# Patient Record
Sex: Male | Born: 1955
Health system: Southern US, Community
[De-identification: ages and names within clinical notes are randomized; demographics above are authoritative.]

## PROBLEM LIST (undated history)

## (undated) DIAGNOSIS — F1021 Alcohol dependence, in remission: Secondary | ICD-10-CM

## (undated) DIAGNOSIS — F32A Depression, unspecified: Secondary | ICD-10-CM

## (undated) DIAGNOSIS — I10 Essential (primary) hypertension: Secondary | ICD-10-CM

## (undated) DIAGNOSIS — I639 Cerebral infarction, unspecified: Secondary | ICD-10-CM

## (undated) DIAGNOSIS — T7840XA Allergy, unspecified, initial encounter: Secondary | ICD-10-CM

## (undated) DIAGNOSIS — I4891 Unspecified atrial fibrillation: Secondary | ICD-10-CM

## (undated) DIAGNOSIS — F329 Major depressive disorder, single episode, unspecified: Secondary | ICD-10-CM

## (undated) DIAGNOSIS — G4733 Obstructive sleep apnea (adult) (pediatric): Secondary | ICD-10-CM

## (undated) DIAGNOSIS — E785 Hyperlipidemia, unspecified: Secondary | ICD-10-CM

## (undated) DIAGNOSIS — M109 Gout, unspecified: Secondary | ICD-10-CM

## (undated) DIAGNOSIS — G4730 Sleep apnea, unspecified: Secondary | ICD-10-CM

## (undated) DIAGNOSIS — Z9989 Dependence on other enabling machines and devices: Principal | ICD-10-CM

## (undated) DIAGNOSIS — K635 Polyp of colon: Secondary | ICD-10-CM

## (undated) DIAGNOSIS — I2510 Atherosclerotic heart disease of native coronary artery without angina pectoris: Secondary | ICD-10-CM

## (undated) HISTORY — DX: Cerebral infarction, unspecified: I63.9

## (undated) HISTORY — DX: Gout, unspecified: M10.9

## (undated) HISTORY — DX: Essential (primary) hypertension: I10

## (undated) HISTORY — PX: FINGER NEUROPLASTY: SHX1639

## (undated) HISTORY — DX: Depression, unspecified: F32.A

## (undated) HISTORY — DX: Allergy, unspecified, initial encounter: T78.40XA

## (undated) HISTORY — DX: Alcohol dependence, in remission: F10.21

## (undated) HISTORY — DX: Unspecified atrial fibrillation: I48.91

## (undated) HISTORY — PX: POLYPECTOMY: SHX149

## (undated) HISTORY — DX: Major depressive disorder, single episode, unspecified: F32.9

## (undated) HISTORY — DX: Polyp of colon: K63.5

## (undated) HISTORY — DX: Sleep apnea, unspecified: G47.30

## (undated) HISTORY — DX: Hyperlipidemia, unspecified: E78.5

## (undated) HISTORY — DX: Dependence on other enabling machines and devices: Z99.89

## (undated) HISTORY — DX: Obstructive sleep apnea (adult) (pediatric): G47.33

## (undated) HISTORY — DX: Atherosclerotic heart disease of native coronary artery without angina pectoris: I25.10

---

## 2001-06-18 HISTORY — PX: OTHER SURGICAL HISTORY: SHX169

## 2010-08-18 ENCOUNTER — Encounter: Payer: Self-pay | Admitting: Cardiology

## 2010-08-18 ENCOUNTER — Institutional Professional Consult (permissible substitution) (INDEPENDENT_AMBULATORY_CARE_PROVIDER_SITE_OTHER): Payer: No Typology Code available for payment source | Admitting: Cardiology

## 2010-08-18 DIAGNOSIS — R0789 Other chest pain: Secondary | ICD-10-CM

## 2010-08-18 DIAGNOSIS — I2510 Atherosclerotic heart disease of native coronary artery without angina pectoris: Secondary | ICD-10-CM

## 2010-08-18 DIAGNOSIS — I10 Essential (primary) hypertension: Secondary | ICD-10-CM

## 2010-08-18 DIAGNOSIS — E7800 Pure hypercholesterolemia, unspecified: Secondary | ICD-10-CM

## 2010-08-28 ENCOUNTER — Other Ambulatory Visit: Payer: Self-pay | Admitting: Cardiology

## 2010-08-28 ENCOUNTER — Other Ambulatory Visit: Payer: Self-pay | Admitting: Family Medicine

## 2010-08-28 DIAGNOSIS — R109 Unspecified abdominal pain: Secondary | ICD-10-CM

## 2010-08-30 ENCOUNTER — Encounter (INDEPENDENT_AMBULATORY_CARE_PROVIDER_SITE_OTHER): Payer: Self-pay | Admitting: *Deleted

## 2010-08-30 DIAGNOSIS — R079 Chest pain, unspecified: Secondary | ICD-10-CM | POA: Insufficient documentation

## 2010-09-05 NOTE — Progress Notes (Signed)
Summary: [facility]   Imported By: Lenard Forth 08/29/2010 12:55:10  _____________________________________________________________________  External Attachment:    Type:   Image     Comment:   External Document

## 2010-09-05 NOTE — Medication Information (Signed)
Summary: CTA-Promise study   CTA-Promise study   Imported By: Lenard Forth 08/29/2010 12:53:47  _____________________________________________________________________  External Attachment:    Type:   Image     Comment:   External Document

## 2010-09-05 NOTE — Letter (Signed)
Summary: Generic Letter  Architectural technologist, Main Office  [address]   [place], [place] [address]   Phone: [phone]  Fax: [phone]        August 30, 2010 MRN: [id number]    Richard [name] [address] [place], [place]  [address]    Dear Mr. Rasool,   Dear Mr. Gnau,   Dr. Swaziland  has scheduled you to have an CARDIAC CT 09/21/10 at 1pm.  DO NOT EAT OR DRINK ANYTHING AFTER 9AM, THE MORNING OF YOUR PROCEDURE.  PLEASE ARRIVE 1 HOUR PRIOR TO APPOINTMENT TIME,AT THE [place] OUT-PATIENT ADMISSION OFFICE LOCATED ON THE FIRST FLOOR NEAR THE GIFT SHOP.   Sincerely,  Richard [name]  This letter has been electronically signed by your physician.       Appended Document: Generic Letter    Clinical Lists Changes  Problems: Added new problem of CHEST PAIN (ICD-786.50) Orders: Added new Referral order of Cardiac CTA (Cardiac CTA) - Signed

## 2010-09-21 ENCOUNTER — Ambulatory Visit (HOSPITAL_COMMUNITY): Admission: RE | Admit: 2010-09-21 | Payer: No Typology Code available for payment source | Source: Ambulatory Visit

## 2010-09-21 ENCOUNTER — Ambulatory Visit: Payer: No Typology Code available for payment source | Admitting: Cardiology

## 2010-09-21 ENCOUNTER — Ambulatory Visit (HOSPITAL_COMMUNITY)
Admission: RE | Admit: 2010-09-21 | Discharge: 2010-09-21 | Disposition: A | Discharge status: Home / Self Care | Payer: No Typology Code available for payment source | Source: Ambulatory Visit | Attending: Cardiology | Admitting: Cardiology

## 2010-09-21 DIAGNOSIS — R9439 Abnormal result of other cardiovascular function study: Secondary | ICD-10-CM | POA: Insufficient documentation

## 2010-09-21 DIAGNOSIS — E785 Hyperlipidemia, unspecified: Secondary | ICD-10-CM | POA: Insufficient documentation

## 2010-09-21 DIAGNOSIS — I10 Essential (primary) hypertension: Secondary | ICD-10-CM | POA: Insufficient documentation

## 2010-09-21 DIAGNOSIS — I2510 Atherosclerotic heart disease of native coronary artery without angina pectoris: Secondary | ICD-10-CM | POA: Insufficient documentation

## 2010-09-21 MED ORDER — IOHEXOL 350 MG/ML SOLN
80.0000 mL | Freq: Once | INTRAVENOUS | Status: AC | PRN
  Administered 2010-09-21: 80 mL via INTRAVENOUS

## 2010-09-25 ENCOUNTER — Telehealth: Payer: Self-pay | Admitting: *Deleted

## 2010-09-25 NOTE — Telephone Encounter (Signed)
Message copied by Murrell Redden on Mon Sep 25, 2010  2:05 PM ------      Message from: Swaziland, PETER      Created: Thu Sep 21, 2010  5:08 PM       As noted before.Marland Kitchen

## 2010-09-25 NOTE — Telephone Encounter (Signed)
LM w/ results of CTA

## 2010-10-05 ENCOUNTER — Encounter: Payer: Self-pay | Admitting: Cardiology

## 2010-10-05 DIAGNOSIS — I10 Essential (primary) hypertension: Secondary | ICD-10-CM | POA: Insufficient documentation

## 2010-10-05 DIAGNOSIS — E785 Hyperlipidemia, unspecified: Secondary | ICD-10-CM | POA: Insufficient documentation

## 2010-10-09 ENCOUNTER — Encounter: Payer: Self-pay | Admitting: Cardiology

## 2010-10-09 ENCOUNTER — Ambulatory Visit (INDEPENDENT_AMBULATORY_CARE_PROVIDER_SITE_OTHER): Payer: No Typology Code available for payment source | Admitting: Cardiology

## 2010-10-09 DIAGNOSIS — R079 Chest pain, unspecified: Secondary | ICD-10-CM

## 2010-10-09 DIAGNOSIS — E785 Hyperlipidemia, unspecified: Secondary | ICD-10-CM

## 2010-10-09 DIAGNOSIS — I10 Essential (primary) hypertension: Secondary | ICD-10-CM

## 2010-10-09 NOTE — Assessment & Plan Note (Signed)
Recent lab work demonstrated significant cholesterol elevation with a total cholesterol 257 and an LDL of 192. Now on Lipitor 20 mg daily in addition to Trilipix. I recommend repeat blood work in 3 months. We will help Richard [name] get established with primary care and I will see him back as needed.

## 2010-10-09 NOTE — Assessment & Plan Note (Signed)
Good blood pressure control. We'll continue with his current medical therapy.

## 2010-10-09 NOTE — Assessment & Plan Note (Signed)
Nonobstructive coronary disease noted by cardiac CT angiogram. I recommended risk factor modification.

## 2010-10-09 NOTE — Patient Instructions (Signed)
Continue to take your medications.   You need to have your blood work rechecked in 3 months.  I will see you back as needed.   Stay on top of your blood pressure and cholesterol!  Get regular aerobic exercise.

## 2010-10-09 NOTE — Progress Notes (Signed)
   Richard [name] Date of Birth: 04-28-1956   History of Present Illness: Richard [name] is seen for followup of his cardiac CT angiogram. This demonstrated a less than 40% stenosis in the proximal LAD with a focal area of calcification. No obstructive disease was noted. He has had no further chest pain. He feels very well.  Current Outpatient Prescriptions on File Prior to Visit  Medication Sig Dispense Refill  . atenolol (TENORMIN) 25 MG tablet Take 25 mg by mouth 2 (two) times daily.        Marland Kitchen atorvastatin (LIPITOR) 20 MG tablet Take 20 mg by mouth daily.        . Choline Fenofibrate (TRILIPIX) 135 MG capsule Take 135 mg by mouth daily.        Marland Kitchen diltiazem (DILACOR XR) 240 MG 24 hr capsule Take 240 mg by mouth daily.          Allergies  Allergen Reactions  . Penicillins     Past Medical History  Diagnosis Date  . Hyperlipidemia   . Hypertension     Past Surgical History  Procedure Date  . Finger neuroplasty     left    History  Smoking status  . Never Smoker   Smokeless tobacco  . Not on file    History  Alcohol Use No    Family History  Problem Relation Age of Onset  . Hypertension Mother   . Diabetes Mother   . Heart attack Brother     Review of Systems:  All other systems were reviewed and are negative.  Physical Exam: BP 118/74  Pulse 70  Wt 216 lb (97.977 kg) He is well-developed white male in no acute distress. HEENT exam is unremarkable. Lungs are clear. Cardiac exam reveals a regular rate and rhythm without gallop, murmur, or click. He has no JVD or bruits. Abdomen is soft and nontender. He has no edema. Pedal pulses are good. LABORATORY DATA: Cardiac CT angiogram is as noted above.  Assessment / Plan:

## 2010-10-11 ENCOUNTER — Telehealth: Payer: Self-pay | Admitting: Cardiology

## 2010-10-11 NOTE — Telephone Encounter (Signed)
Called to let Richard [name]/Richard [name] know that his primary care provider is Dr. Arthur Holms at [facility].

## 2010-11-20 ENCOUNTER — Other Ambulatory Visit: Payer: Self-pay | Admitting: *Deleted

## 2010-11-20 MED ORDER — ATENOLOL 25 MG PO TABS: 25.0000 mg | ORAL_TABLET | Freq: Two times a day (BID) | ORAL | Status: DC

## 2010-11-20 MED ORDER — ATORVASTATIN CALCIUM 20 MG PO TABS: 20.0000 mg | ORAL_TABLET | Freq: Every day | ORAL | Status: DC

## 2010-11-20 MED ORDER — DILTIAZEM HCL ER 240 MG PO CP24: 240.0000 mg | ORAL_CAPSULE | Freq: Every day | ORAL | Status: DC

## 2010-11-20 MED ORDER — CHOLINE FENOFIBRATE 135 MG PO CPDR: 135.0000 mg | DELAYED_RELEASE_CAPSULE | Freq: Every day | ORAL | Status: DC

## 2010-11-20 NOTE — Telephone Encounter (Signed)
escribe medication per fax request  

## 2010-12-05 ENCOUNTER — Other Ambulatory Visit: Payer: Self-pay | Admitting: Cardiology

## 2010-12-15 ENCOUNTER — Other Ambulatory Visit: Payer: Self-pay | Admitting: *Deleted

## 2010-12-15 MED ORDER — DILTIAZEM HCL ER 240 MG PO CP24: 240.0000 mg | ORAL_CAPSULE | Freq: Every day | ORAL | Status: DC

## 2010-12-15 NOTE — Telephone Encounter (Signed)
escribe medication per fax request  

## 2011-08-25 ENCOUNTER — Encounter: Payer: Self-pay | Admitting: Internal Medicine

## 2011-08-25 DIAGNOSIS — F1021 Alcohol dependence, in remission: Secondary | ICD-10-CM

## 2011-08-25 DIAGNOSIS — I2510 Atherosclerotic heart disease of native coronary artery without angina pectoris: Secondary | ICD-10-CM

## 2011-08-25 HISTORY — DX: Alcohol dependence, in remission: F10.21

## 2011-08-25 HISTORY — DX: Atherosclerotic heart disease of native coronary artery without angina pectoris: I25.10

## 2011-08-30 ENCOUNTER — Ambulatory Visit (INDEPENDENT_AMBULATORY_CARE_PROVIDER_SITE_OTHER): Payer: No Typology Code available for payment source | Admitting: Internal Medicine

## 2011-08-30 ENCOUNTER — Other Ambulatory Visit (INDEPENDENT_AMBULATORY_CARE_PROVIDER_SITE_OTHER): Payer: No Typology Code available for payment source

## 2011-08-30 ENCOUNTER — Encounter: Payer: Self-pay | Admitting: Internal Medicine

## 2011-08-30 VITALS — BP 108/60 | HR 52 | Temp 98.4°F | Ht 72.0 in | Wt 220.4 lb

## 2011-08-30 DIAGNOSIS — Z0000 Encounter for general adult medical examination without abnormal findings: Secondary | ICD-10-CM

## 2011-08-30 DIAGNOSIS — Z0001 Encounter for general adult medical examination with abnormal findings: Secondary | ICD-10-CM | POA: Insufficient documentation

## 2011-08-30 DIAGNOSIS — F32A Depression, unspecified: Secondary | ICD-10-CM | POA: Insufficient documentation

## 2011-08-30 DIAGNOSIS — F329 Major depressive disorder, single episode, unspecified: Secondary | ICD-10-CM | POA: Insufficient documentation

## 2011-08-30 LAB — CBC WITH DIFFERENTIAL/PLATELET
Basophils Relative: 0.6 % (ref 0.0–3.0)
Eosinophils Relative: 1.2 % (ref 0.0–5.0)
HCT: 46.9 % (ref 39.0–52.0)
Hemoglobin: 15.7 g/dL (ref 13.0–17.0)
Lymphs Abs: 2.2 10*3/uL (ref 0.7–4.0)
MCV: 91.3 fl (ref 78.0–100.0)
Monocytes Absolute: 0.6 10*3/uL (ref 0.1–1.0)
Monocytes Relative: 9.4 % (ref 3.0–12.0)
Neutro Abs: 3.8 10*3/uL (ref 1.4–7.7)
Platelets: 179 10*3/uL (ref 150.0–400.0)
RBC: 5.14 Mil/uL (ref 4.22–5.81)
WBC: 6.8 10*3/uL (ref 4.5–10.5)

## 2011-08-30 LAB — LIPID PANEL
HDL: 51.8 mg/dL (ref >39.00)
Total CHOL/HDL Ratio: 4
VLDL: 11.2 mg/dL (ref 0.0–40.0)

## 2011-08-30 LAB — URINALYSIS, ROUTINE W REFLEX MICROSCOPIC
Bilirubin Urine: NEGATIVE
Ketones, ur: NEGATIVE
Leukocytes, UA: NEGATIVE
Nitrite: NEGATIVE
Specific Gravity, Urine: <=1.005 (ref 1.000–1.030)
Total Protein, Urine: NEGATIVE
pH: 5.5 (ref 5.0–8.0)

## 2011-08-30 LAB — HEPATIC FUNCTION PANEL
ALT: 22 U/L (ref 0–53)
AST: 24 U/L (ref 0–37)
Albumin: 4.7 g/dL (ref 3.5–5.2)
Alkaline Phosphatase: 68 U/L (ref 39–117)
Bilirubin, Direct: 0.1 mg/dL (ref 0.0–0.3)

## 2011-08-30 LAB — BASIC METABOLIC PANEL
BUN: 17 mg/dL (ref 6–23)
Creatinine, Ser: 1.2 mg/dL (ref 0.4–1.5)
GFR: 63.83 mL/min (ref >60.00)
Potassium: 4 mEq/L (ref 3.5–5.1)

## 2011-08-30 LAB — TSH: TSH: 1.07 u[IU]/mL (ref 0.35–5.50)

## 2011-08-30 LAB — PSA: PSA: 2.22 ng/mL (ref 0.10–4.00)

## 2011-08-30 LAB — LDL CHOLESTEROL, DIRECT: Direct LDL: 146.2 mg/dL

## 2011-08-30 MED ORDER — ASPIRIN 81 MG PO TBEC: 81.0000 mg | DELAYED_RELEASE_TABLET | Freq: Every day | ORAL | Status: AC

## 2011-08-30 NOTE — Patient Instructions (Signed)
Please start Aspirin 81 mg per day - COATED only Continue all other medications as before Please have the pharmacy call with any refills you may need. Please go to LAB in the Basement for the blood and/or urine tests to be done today You will be contacted by phone if any changes need to be made immediately.  Otherwise, you will receive a letter about your results with an explanation. You will be contacted regarding the referral for: colonoscopy Please return in 1 year for your yearly visit, or sooner if needed, with Lab testing done 3-5 days before

## 2011-08-31 ENCOUNTER — Encounter: Payer: Self-pay | Admitting: Gastroenterology

## 2011-08-31 ENCOUNTER — Telehealth: Payer: Self-pay | Admitting: Internal Medicine

## 2011-08-31 MED ORDER — ATORVASTATIN CALCIUM 40 MG PO TABS: 40.0000 mg | ORAL_TABLET | Freq: Every day | ORAL | Status: DC

## 2011-08-31 NOTE — Telephone Encounter (Signed)
Ok to incr the lipitor to 40 mg  - done per [facility] re-check with next visit  To follow lower chol diet as well  Letter sent as well

## 2011-08-31 NOTE — Telephone Encounter (Signed)
Patient informed. 

## 2011-08-31 NOTE — Telephone Encounter (Signed)
Message copied by Corwin Levins on Fri Aug 31, 2011  1:19 PM ------      Message from: Scharlene Gloss B      Created: Fri Aug 31, 2011 10:02 AM       Called the patient and he had not been taking Lipitor everyday up until 1 week ago.

## 2011-09-02 ENCOUNTER — Encounter: Payer: Self-pay | Admitting: Internal Medicine

## 2011-09-02 NOTE — Assessment & Plan Note (Addendum)
Overall doing well, age appropriate education and counseling updated, referrals for preventative services and immunizations addressed, dietary and smoking counseling addressed, most recent labs and ECG reviewed.  I have personally reviewed and have noted: 1) the patient's medical and social history 2) The pt's use of alcohol, tobacco, and illicit drugs 3) The patient's current medications and supplements 4) Functional ability including ADL's, fall risk, home safety risk, hearing and visual impairment 5) Diet and physical activities 6) Evidence for depression or mood disorder 7) The patient's height, weight, and BMI have been recorded in the chart I have made referrals, and provided counseling and education based on review of the above For labs today, also for colonoscpy

## 2011-09-02 NOTE — Progress Notes (Signed)
Subjective:    Patient ID: Richard [name], male    DOB: 03-17-1956, 56 y.o.   MRN: [id number]  HPI  Here for wellness and f/u;  Overall doing ok;  Pt denies CP, worsening SOB, DOE, wheezing, orthopnea, PND, worsening LE edema, palpitations, dizziness or syncope.  Pt denies neurological change such as new Headache, facial or extremity weakness.  Pt denies polydipsia, polyuria, or low sugar symptoms. Pt states overall good compliance with treatment and medications, good tolerability, and trying to follow lower cholesterol diet.  Pt denies worsening depressive symptoms, suicidal ideation or panic. No fever, wt loss, night sweats, loss of appetite, or other constitutional symptoms.  Pt states good ability with ADL's, low fall risk, home safety reviewed and adequate, no significant changes in hearing or vision, and occasionally active with exercise.  No new complaints Past Medical History  Diagnosis Date  . Hyperlipidemia   . Hypertension   . CAD (coronary artery disease) 08/25/2011  . Alcoholism in remission 08/25/2011  . Depression    Past Surgical History  Procedure Date  . Finger neuroplasty     left  . Left achillies 2003    reports that he has never smoked. He does not have any smokeless tobacco history on file. He reports that he does not drink alcohol or use illicit drugs. family history includes Diabetes in his mother; Heart attack in his brother; and Hypertension in his mother. Allergies  Allergen Reactions  . Penicillins    Current Outpatient Prescriptions on File Prior to Visit  Medication Sig Dispense Refill  . atenolol (TENORMIN) 25 MG tablet Take 1 tablet (25 mg total) by mouth 2 (two) times daily.  180 tablet  3  . Choline Fenofibrate (TRILIPIX) 135 MG capsule Take 1 capsule (135 mg total) by mouth daily.  90 capsule  3  . diltiazem (DILACOR XR) 240 MG 24 hr capsule Take 1 capsule (240 mg total) by mouth daily.  90 capsule  3   Review of Systems Review of Systems    Constitutional: Negative for diaphoresis, activity change, appetite change and unexpected weight change.  HENT: Negative for hearing loss, ear pain, facial swelling, mouth sores and neck stiffness.   Eyes: Negative for pain, redness and visual disturbance.  Respiratory: Negative for shortness of breath and wheezing.   Cardiovascular: Negative for chest pain and palpitations.  Gastrointestinal: Negative for diarrhea, blood in stool, abdominal distention and rectal pain.  Genitourinary: Negative for hematuria, flank pain and decreased urine volume.  Musculoskeletal: Negative for myalgias and joint swelling.  Skin: Negative for color change and wound.  Neurological: Negative for syncope and numbness.  Hematological: Negative for adenopathy.  Psychiatric/Behavioral: Negative for hallucinations, self-injury, decreased concentration and agitation.      Objective:   Physical Exam BP 108/60  Pulse 52  Temp(Src) 98.4 F (36.9 C) (Oral)  Ht 6' (1.829 m)  Wt 220 lb 6 oz (99.961 kg)  BMI 29.89 kg/m2  SpO2 98% Physical Exam  VS noted Constitutional: Pt is oriented to person, place, and time. Appears well-developed and well-nourished.  HENT:  Head: Normocephalic and atraumatic.  Right Ear: External ear normal.  Left Ear: External ear normal.  Nose: Nose normal.  Mouth/Throat: Oropharynx is clear and moist.  Eyes: Conjunctivae and EOM are normal. Pupils are equal, round, and reactive to light.  Neck: Normal range of motion. Neck supple. No JVD present. No tracheal deviation present.  Cardiovascular: Normal rate, regular rhythm, normal heart sounds and intact distal pulses.  Pulmonary/Chest: Effort normal and breath sounds normal.  Abdominal: Soft. Bowel sounds are normal. There is no tenderness.  Musculoskeletal: Normal range of motion. Exhibits no edema.  Lymphadenopathy:  Has no cervical adenopathy.  Neurological: Pt is alert and oriented to person, place, and time. Pt has normal  reflexes. No cranial nerve deficit.  Skin: Skin is warm and dry. No rash noted.  Psychiatric:  Has  normal mood and affect. Behavior is normal.     Assessment & Plan:

## 2011-10-08 ENCOUNTER — Ambulatory Visit (AMBULATORY_SURGERY_CENTER): Payer: No Typology Code available for payment source | Admitting: *Deleted

## 2011-10-08 VITALS — Ht 72.0 in | Wt 218.2 lb

## 2011-10-08 DIAGNOSIS — Z1211 Encounter for screening for malignant neoplasm of colon: Secondary | ICD-10-CM

## 2011-10-08 MED ORDER — PEG-KCL-NACL-NASULF-NA ASC-C 100 G PO SOLR: ORAL | Status: DC

## 2011-10-09 ENCOUNTER — Encounter: Payer: Self-pay | Admitting: Gastroenterology

## 2011-10-12 NOTE — Progress Notes (Signed)
Addended by: Maple Hudson on: 10/12/2011 12:19 PM   Modules accepted: Level of Service

## 2011-10-22 ENCOUNTER — Ambulatory Visit (AMBULATORY_SURGERY_CENTER): Payer: No Typology Code available for payment source | Admitting: Gastroenterology

## 2011-10-22 ENCOUNTER — Encounter: Payer: Self-pay | Admitting: Gastroenterology

## 2011-10-22 VITALS — BP 140/87 | HR 66 | Temp 98.1°F | Resp 18 | Ht 72.0 in | Wt 218.0 lb

## 2011-10-22 DIAGNOSIS — D126 Benign neoplasm of colon, unspecified: Secondary | ICD-10-CM

## 2011-10-22 DIAGNOSIS — Z1211 Encounter for screening for malignant neoplasm of colon: Secondary | ICD-10-CM

## 2011-10-22 HISTORY — PX: COLONOSCOPY: SHX174

## 2011-10-22 MED ORDER — SODIUM CHLORIDE 0.9 % IV SOLN: 500.0000 mL | INTRAVENOUS | Status: DC

## 2011-10-22 NOTE — Progress Notes (Signed)
Patient did not have preoperative order for IV antibiotic SSI prophylaxis. (G8918)  Patient did not experience any of the following events: a burn prior to discharge; a fall within the facility; wrong site/side/patient/procedure/implant event; or a hospital transfer or hospital admission upon discharge from the facility. (G8907)  

## 2011-10-22 NOTE — Patient Instructions (Signed)

## 2011-10-22 NOTE — Op Note (Signed)
[facility] [address] [place], [place]  [address]  COLONOSCOPY PROCEDURE REPORT  PATIENT:  Richard, [name]  MR#:  [id number] BIRTHDATE:  [birth year], 56 yrs. old  GENDER:  male ENDOSCOPIST:  Rachael Fee, MD REF. BY:  Oliver Barre, M.D. PROCEDURE DATE:  10/22/2011 PROCEDURE:  Colonoscopy with snare polypectomy ASA CLASS:  Class II INDICATIONS:  Routine Risk Screening MEDICATIONS:   Fentanyl 75 mcg IV, These medications were titrated to patient response per physician's verbal order, Versed 7 mg IV  DESCRIPTION OF PROCEDURE:   After the risks benefits and alternatives of the procedure were thoroughly explained, informed consent was obtained.  Digital rectal exam was performed and revealed no rectal masses.   The LB [id number] endoscope was introduced through the anus and advanced to the cecum, which was identified by both the appendix and ileocecal valve, without limitations.  The quality of the prep was good..  The instrument was then slowly withdrawn as the colon was fully examined. <<PROCEDUREIMAGES>> FINDINGS:  A 4mm, sessile polyp was removed from descending colon with cold snare and sent to pathology (jar 1). Two pedunculated polyps, 8-58mm across, were removed from sigmoid colon with snare/cautery and sent to pathology (jar 2) (see image3, image5, and image6).  This was otherwise a normal examination of the colon (see image8, image2, and image1).   Retroflexed views in the rectum revealed no abnormalities. COMPLICATIONS:  None  ENDOSCOPIC IMPRESSION: 1) Three polyps were removed, all were sent to pathology 2) Otherwise normal examination  RECOMMENDATIONS: 1) If the polyps removed today are proven to be adenomatous (pre-cancerous) polyps, you will need a colonoscopy in 3 years. Otherwise you should continue to follow colorectal cancer screening guidelines for "routine risk" patients with a colonoscopy in 10 years. You will receive a letter within  1-2 weeks with the results of your biopsy as well as final recommendations. Please call my office if you have not received a letter after 3 weeks.  ______________________________ Rachael Fee, MD  n. eSIGNED:   Rachael Fee at 10/22/2011 09:52 AM  [name], [phone]

## 2011-10-23 ENCOUNTER — Telehealth: Payer: Self-pay

## 2011-10-23 NOTE — Telephone Encounter (Signed)
  Follow up Call-  Call back number 10/22/2011  Post procedure Call Back phone  # [phone]  Permission to leave phone message Yes     Patient questions:  Do you have a fever, pain , or abdominal swelling? no Pain Score  0 *  Have you tolerated food without any problems? yes  Have you been able to return to your normal activities? yes  Do you have any questions about your discharge instructions: Diet   no Medications  no Follow up visit  no  Do you have questions or concerns about your Care? no  Actions: * If pain score is 4 or above: No action needed, pain <4.

## 2011-10-29 ENCOUNTER — Encounter: Payer: Self-pay | Admitting: Gastroenterology

## 2011-11-16 ENCOUNTER — Other Ambulatory Visit: Payer: Self-pay | Admitting: Internal Medicine

## 2011-11-22 ENCOUNTER — Other Ambulatory Visit: Payer: Self-pay | Admitting: Cardiology

## 2011-11-22 MED ORDER — CHOLINE FENOFIBRATE 135 MG PO CPDR: 135.0000 mg | DELAYED_RELEASE_CAPSULE | Freq: Every day | ORAL | Status: DC

## 2011-12-12 ENCOUNTER — Other Ambulatory Visit: Payer: Self-pay | Admitting: Cardiology

## 2011-12-12 MED ORDER — DILTIAZEM HCL ER 240 MG PO CP24: 240.0000 mg | ORAL_CAPSULE | Freq: Every day | ORAL | Status: DC

## 2011-12-13 ENCOUNTER — Other Ambulatory Visit: Payer: Self-pay | Admitting: Internal Medicine

## 2012-08-20 ENCOUNTER — Other Ambulatory Visit: Payer: Self-pay | Admitting: Internal Medicine

## 2012-09-04 ENCOUNTER — Encounter: Payer: No Typology Code available for payment source | Admitting: Internal Medicine

## 2012-10-15 ENCOUNTER — Ambulatory Visit (INDEPENDENT_AMBULATORY_CARE_PROVIDER_SITE_OTHER): Payer: Federal, State, Local not specified - PPO | Admitting: Internal Medicine

## 2012-10-15 ENCOUNTER — Encounter: Payer: Self-pay | Admitting: Internal Medicine

## 2012-10-15 ENCOUNTER — Other Ambulatory Visit (INDEPENDENT_AMBULATORY_CARE_PROVIDER_SITE_OTHER): Payer: Federal, State, Local not specified - PPO

## 2012-10-15 VITALS — BP 104/74 | HR 46 | Temp 97.8°F | Ht 72.0 in | Wt 220.2 lb

## 2012-10-15 DIAGNOSIS — Z0000 Encounter for general adult medical examination without abnormal findings: Secondary | ICD-10-CM

## 2012-10-15 DIAGNOSIS — M545 Low back pain, unspecified: Secondary | ICD-10-CM

## 2012-10-15 LAB — CBC WITH DIFFERENTIAL/PLATELET
Basophils Absolute: 0 10*3/uL (ref 0.0–0.1)
HCT: 47.4 % (ref 39.0–52.0)
Lymphs Abs: 2.5 10*3/uL (ref 0.7–4.0)
MCHC: 33.8 g/dL (ref 30.0–36.0)
MCV: 89.2 fl (ref 78.0–100.0)
Monocytes Absolute: 0.8 10*3/uL (ref 0.1–1.0)
Platelets: 202 10*3/uL (ref 150.0–400.0)
RDW: 13.7 % (ref 11.5–14.6)

## 2012-10-15 LAB — LIPID PANEL
Cholesterol: 144 mg/dL (ref 0–200)
HDL: 42.6 mg/dL (ref >39.00)
Total CHOL/HDL Ratio: 3
Triglycerides: 56 mg/dL (ref 0.0–149.0)

## 2012-10-15 LAB — BASIC METABOLIC PANEL
BUN: 18 mg/dL (ref 6–23)
CO2: 24 mEq/L (ref 19–32)
Calcium: 9.6 mg/dL (ref 8.4–10.5)
Creatinine, Ser: 1.3 mg/dL (ref 0.4–1.5)

## 2012-10-15 LAB — URINALYSIS, ROUTINE W REFLEX MICROSCOPIC
Bilirubin Urine: NEGATIVE
Ketones, ur: NEGATIVE
Leukocytes, UA: NEGATIVE
Urobilinogen, UA: 0.2 (ref 0.0–1.0)

## 2012-10-15 LAB — HEPATIC FUNCTION PANEL
ALT: 31 U/L (ref 0–53)
Albumin: 4.5 g/dL (ref 3.5–5.2)
Bilirubin, Direct: 0.1 mg/dL (ref 0.0–0.3)
Total Protein: 7.1 g/dL (ref 6.0–8.3)

## 2012-10-15 LAB — TSH: TSH: 1.29 u[IU]/mL (ref 0.35–5.50)

## 2012-10-15 MED ORDER — ATORVASTATIN CALCIUM 40 MG PO TABS: ORAL_TABLET | ORAL | Status: DC

## 2012-10-15 MED ORDER — ATENOLOL 25 MG PO TABS: ORAL_TABLET | ORAL | Status: DC

## 2012-10-15 MED ORDER — FLUOCINONIDE 0.05 % EX CREA: 1.0000 "application " | TOPICAL_CREAM | Freq: Every day | CUTANEOUS | Status: DC

## 2012-10-15 MED ORDER — CHOLINE FENOFIBRATE 135 MG PO CPDR: 135.0000 mg | DELAYED_RELEASE_CAPSULE | Freq: Every day | ORAL | Status: DC

## 2012-10-15 MED ORDER — DILTIAZEM HCL ER 240 MG PO CP24: ORAL_CAPSULE | ORAL | Status: DC

## 2012-10-15 NOTE — Assessment & Plan Note (Signed)

## 2012-10-15 NOTE — Progress Notes (Signed)
Subjective:    Patient ID: Richard [name], male    DOB: [birth year], 57 y.o.   MRN: [id number]  HPI  Here for wellness and f/u;  Overall doing ok;  Pt denies CP, worsening SOB, DOE, wheezing, orthopnea, PND, worsening LE edema, palpitations, dizziness or syncope.  Pt denies neurological change such as new headache, facial or extremity weakness.  Pt denies polydipsia, polyuria, or low sugar symptoms. Pt states overall good compliance with treatment and medications, good tolerability, and has been trying to follow lower cholesterol diet.  Pt denies worsening depressive symptoms, suicidal ideation or panic. No fever, night sweats, wt loss, loss of appetite, or other constitutional symptoms.  Pt states good ability with ADL's, has low fall risk, home safety reviewed and adequate, no other significant changes in hearing or vision, and only occasionally active with exercise.  No acute complaints.  Needs med refills.  Plays golf quite a bit.  Pt continues to have recurring LBP without change in severity, bowel or bladder change, fever, wt loss,  worsening LE pain/numbness/weakness, gait change or falls, has been an ongoing problem no change since junior yr of HS football Past Medical History  Diagnosis Date  . Hyperlipidemia   . Hypertension   . CAD (coronary artery disease) 08/25/2011  . Alcoholism in remission 08/25/2011  . Depression    Past Surgical History  Procedure Laterality Date  . Finger neuroplasty      left index finger  . Left achillies  2003    reports that he has never smoked. He has never used smokeless tobacco. He reports that he does not drink alcohol or use illicit drugs. family history includes Diabetes in his mother; Heart attack in his brother; and Hypertension in his mother.  There is no history of Colon cancer and Stomach cancer. Allergies  Allergen Reactions  . Penicillins Other (See Comments)    Siblings are allergic   No current outpatient prescriptions on file prior to visit.    No current facility-administered medications on file prior to visit.   Review of Systems Constitutional: Negative for diaphoresis, activity change, appetite change or unexpected weight change.  HENT: Negative for hearing loss, ear pain, facial swelling, mouth sores and neck stiffness.   Eyes: Negative for pain, redness and visual disturbance.  Respiratory: Negative for shortness of breath and wheezing.   Cardiovascular: Negative for chest pain and palpitations.  Gastrointestinal: Negative for diarrhea, blood in stool, abdominal distention or other pain Genitourinary: Negative for hematuria, flank pain or change in urine volume.  Musculoskeletal: Negative for myalgias and joint swelling.  Skin: Negative for color change and wound.  Neurological: Negative for syncope and numbness. other than noted Hematological: Negative for adenopathy.  Psychiatric/Behavioral: Negative for hallucinations, self-injury, decreased concentration and agitation.      Objective:   Physical Exam BP 104/74  Pulse 46  Temp(Src) 97.8 F (36.6 C) (Oral)  Ht 6' (1.829 m)  Wt 220 lb 4 oz (99.905 kg)  BMI 29.86 kg/m2  SpO2 97% VS noted,  Constitutional: Pt is oriented to person, place, and time. Appears well-developed and well-nourished.  Head: Normocephalic and atraumatic.  Right Ear: External ear normal.  Left Ear: External ear normal.  Nose: Nose normal.  Mouth/Throat: Oropharynx is clear and moist.  Eyes: Conjunctivae and EOM are normal. Pupils are equal, round, and reactive to light.  Neck: Normal range of motion. Neck supple. No JVD present. No tracheal deviation present.  Cardiovascular: Normal rate, regular rhythm, normal heart sounds and intact  distal pulses.   Pulmonary/Chest: Effort normal and breath sounds normal.  Abdominal: Soft. Bowel sounds are normal. There is no tenderness. No HSM  Musculoskeletal: Normal range of motion. Exhibits no edema.  Lymphadenopathy:  Has no cervical adenopathy.   Neurological: Pt is alert and oriented to person, place, and time. Pt has normal reflexes. No cranial nerve deficit.  Skin: Skin is warm and dry. No rash noted.  Psychiatric:  Has  normal mood and affect. Behavior is normal.     Assessment & Plan:

## 2012-10-15 NOTE — Patient Instructions (Addendum)
Please continue all other medications as before, and refills have been done for all meds today Please have the pharmacy call with any other refills you may need. Please go to the LAB in the Basement (turn left off the elevator) for the tests to be done today You will be contacted by phone if any changes need to be made immediately.  Otherwise, you will receive a letter about your results with an explanation, but please check with MyChart first Thank you for enrolling in MyChart. Please follow the instructions below to securely access your online medical record. MyChart allows you to send messages to your doctor, view your test results, renew your prescriptions, schedule appointments, and more. To Log into My Chart online, please go by [employer] or [employer] to [email or web address], or download the MyChart App from the [employer] of [employer] .  Your Username is: [email or web address] (pass pizza) Please send a practice Message on Mychart later today. Please return in 1 year for your yearly visit, or sooner if needed, with Lab testing done 3-5 days before

## 2013-04-23 ENCOUNTER — Other Ambulatory Visit: Payer: Self-pay

## 2013-10-24 ENCOUNTER — Other Ambulatory Visit: Payer: Self-pay | Admitting: Internal Medicine

## 2014-08-13 ENCOUNTER — Other Ambulatory Visit: Payer: Self-pay | Admitting: Internal Medicine

## 2014-08-16 ENCOUNTER — Other Ambulatory Visit: Payer: Self-pay | Admitting: Internal Medicine

## 2014-08-18 ENCOUNTER — Other Ambulatory Visit: Payer: Self-pay | Admitting: *Deleted

## 2014-08-31 ENCOUNTER — Encounter: Payer: Self-pay | Admitting: Internal Medicine

## 2014-08-31 ENCOUNTER — Ambulatory Visit (INDEPENDENT_AMBULATORY_CARE_PROVIDER_SITE_OTHER): Payer: Federal, State, Local not specified - PPO | Admitting: Internal Medicine

## 2014-08-31 ENCOUNTER — Other Ambulatory Visit (INDEPENDENT_AMBULATORY_CARE_PROVIDER_SITE_OTHER): Payer: Federal, State, Local not specified - PPO

## 2014-08-31 VITALS — BP 122/80 | HR 53 | Temp 98.3°F | Resp 18 | Ht 72.0 in | Wt 218.0 lb

## 2014-08-31 DIAGNOSIS — Z23 Encounter for immunization: Secondary | ICD-10-CM

## 2014-08-31 DIAGNOSIS — Z0000 Encounter for general adult medical examination without abnormal findings: Secondary | ICD-10-CM

## 2014-08-31 DIAGNOSIS — Z86010 Personal history of colonic polyps: Secondary | ICD-10-CM

## 2014-08-31 DIAGNOSIS — J309 Allergic rhinitis, unspecified: Secondary | ICD-10-CM | POA: Insufficient documentation

## 2014-08-31 LAB — URINALYSIS, ROUTINE W REFLEX MICROSCOPIC
Bilirubin Urine: NEGATIVE
Hgb urine dipstick: NEGATIVE
Ketones, ur: NEGATIVE
LEUKOCYTES UA: NEGATIVE
NITRITE: NEGATIVE
PH: 5.5 (ref 5.0–8.0)
SPECIFIC GRAVITY, URINE: 1.01 (ref 1.000–1.030)
TOTAL PROTEIN, URINE-UPE24: NEGATIVE
Urine Glucose: NEGATIVE
Urobilinogen, UA: 0.2 (ref 0.0–1.0)

## 2014-08-31 LAB — CBC WITH DIFFERENTIAL/PLATELET
BASOS ABS: 0 10*3/uL (ref 0.0–0.1)
Basophils Relative: 0.6 % (ref 0.0–3.0)
EOS ABS: 0.1 10*3/uL (ref 0.0–0.7)
Eosinophils Relative: 1.1 % (ref 0.0–5.0)
HCT: 47.6 % (ref 39.0–52.0)
Hemoglobin: 16.4 g/dL (ref 13.0–17.0)
Lymphocytes Relative: 32.8 % (ref 12.0–46.0)
Lymphs Abs: 2.4 10*3/uL (ref 0.7–4.0)
MCHC: 34.5 g/dL (ref 30.0–36.0)
MCV: 88.5 fl (ref 78.0–100.0)
MONO ABS: 0.6 10*3/uL (ref 0.1–1.0)
Monocytes Relative: 7.8 % (ref 3.0–12.0)
NEUTROS PCT: 57.7 % (ref 43.0–77.0)
Neutro Abs: 4.2 10*3/uL (ref 1.4–7.7)
PLATELETS: 199 10*3/uL (ref 150.0–400.0)
RBC: 5.38 Mil/uL (ref 4.22–5.81)
RDW: 13.5 % (ref 11.5–15.5)
WBC: 7.4 10*3/uL (ref 4.0–10.5)

## 2014-08-31 LAB — BASIC METABOLIC PANEL
BUN: 15 mg/dL (ref 6–23)
CHLORIDE: 107 meq/L (ref 96–112)
CO2: 26 meq/L (ref 19–32)
Calcium: 9.7 mg/dL (ref 8.4–10.5)
Creatinine, Ser: 1.1 mg/dL (ref 0.40–1.50)
GFR: 72.53 mL/min (ref >60.00)
Glucose, Bld: 89 mg/dL (ref 70–99)
Potassium: 4 mEq/L (ref 3.5–5.1)
SODIUM: 138 meq/L (ref 135–145)

## 2014-08-31 LAB — TSH: TSH: 1.3 u[IU]/mL (ref 0.35–4.50)

## 2014-08-31 LAB — HEPATIC FUNCTION PANEL
ALT: 25 U/L (ref 0–53)
AST: 20 U/L (ref 0–37)
Albumin: 4.8 g/dL (ref 3.5–5.2)
Alkaline Phosphatase: 78 U/L (ref 39–117)
BILIRUBIN TOTAL: 0.8 mg/dL (ref 0.2–1.2)
Bilirubin, Direct: 0.1 mg/dL (ref 0.0–0.3)
Total Protein: 7.3 g/dL (ref 6.0–8.3)

## 2014-08-31 LAB — LIPID PANEL
Cholesterol: 279 mg/dL — ABNORMAL HIGH (ref 0–200)
HDL: 40.8 mg/dL (ref >39.00)
LDL Cholesterol: 211 mg/dL — ABNORMAL HIGH (ref 0–99)
NonHDL: 238.2
Total CHOL/HDL Ratio: 7
Triglycerides: 134 mg/dL (ref 0.0–149.0)
VLDL: 26.8 mg/dL (ref 0.0–40.0)

## 2014-08-31 LAB — PSA: PSA: 1.97 ng/mL (ref 0.10–4.00)

## 2014-08-31 MED ORDER — DILTIAZEM HCL ER 240 MG PO CP24: 240.0000 mg | ORAL_CAPSULE | Freq: Every day | ORAL | Status: DC

## 2014-08-31 MED ORDER — TRIAMCINOLONE ACETONIDE 55 MCG/ACT NA AERO: 2.0000 | INHALATION_SPRAY | Freq: Every day | NASAL | Status: DC

## 2014-08-31 MED ORDER — ATORVASTATIN CALCIUM 40 MG PO TABS: ORAL_TABLET | ORAL | Status: DC

## 2014-08-31 MED ORDER — ATENOLOL 25 MG PO TABS: 25.0000 mg | ORAL_TABLET | Freq: Two times a day (BID) | ORAL | Status: DC

## 2014-08-31 MED ORDER — CHOLINE FENOFIBRATE 135 MG PO CPDR: 135.0000 mg | DELAYED_RELEASE_CAPSULE | Freq: Every day | ORAL | Status: DC

## 2014-08-31 MED ORDER — METHYLPREDNISOLONE ACETATE 80 MG/ML IJ SUSP
80.0000 mg | Freq: Once | INTRAMUSCULAR | Status: AC
  Administered 2014-08-31: 80 mg via INTRAMUSCULAR

## 2014-08-31 NOTE — Progress Notes (Signed)
Pre visit review using our clinic review tool, if applicable. No additional management support is needed unless otherwise documented below in the visit note. 

## 2014-08-31 NOTE — Assessment & Plan Note (Signed)
Mild to mod, for depomedrol IM, and nasacort aq asd  to f/u any worsening symptoms or concerns

## 2014-08-31 NOTE — Patient Instructions (Addendum)
You had the tetanus shot today (Tdap)  You had the steroid shot today  Please continue all other medications as before, and refills have been done if requested.  Please have the pharmacy call with any other refills you may need.  Please continue your efforts at being more active, low cholesterol diet, and weight control.  You are otherwise up to date with prevention measures today.  Please keep your appointments with your specialists as you may have planned  You will be contacted regarding the referral for: colonoscopy  Please go to the LAB in the Basement (turn left off the elevator) for the tests to be done today  .You will be contacted by phone if any changes need to be made immediately.  Otherwise, you will receive a letter about your results with an explanation, but please check with MyChart first.  Please remember to sign up for MyChart if you have not done so, as this will be important to you in the future with finding out test results, communicating by private email, and scheduling acute appointments online when needed.  Please return in 1 year for your yearly visit, or sooner if needed, with Lab testing done 3-5 days before

## 2014-08-31 NOTE — Progress Notes (Signed)
Subjective:    Patient ID: Richard [name], male    DOB: 02/20/1956, 59 y.o.   MRN: [id number]  HPI  Here for wellness and f/u;  Overall doing ok;  Pt denies Chest pain, worsening SOB, DOE, wheezing, orthopnea, PND, worsening LE edema, palpitations, dizziness or syncope.  Pt denies neurological change such as new headache, facial or extremity weakness.  Pt denies polydipsia, polyuria, or low sugar symptoms. Pt states overall good compliance with treatment and medications, good tolerability, and has been trying to follow appropriate diet.  Pt denies worsening depressive symptoms, suicidal ideation or panic. No fever, night sweats, wt loss, loss of appetite, or other constitutional symptoms.  Pt states good ability with ADL's, has low fall risk, home safety reviewed and adequate, no other significant changes in hearing or vision, and occasionally active with exercise, walks a few times per wk with his wife.  Retired from [employer]  Does have several wks ongoing nasal allergy symptoms with clearish congestion, itch and sneezing, without fever, pain, ST, cough, swelling or wheezing.  Past Medical History  Diagnosis Date  . Hyperlipidemia   . Hypertension   . CAD (coronary artery disease) 08/25/2011  . Alcoholism in remission 08/25/2011  . Depression    Past Surgical History  Procedure Laterality Date  . Finger neuroplasty      left index finger  . Left achillies  2003    reports that he has never smoked. He has never used smokeless tobacco. He reports that he does not drink alcohol or use illicit drugs. family history includes Diabetes in his mother; Heart attack in his brother; Hypertension in his mother. There is no history of Colon cancer or Stomach cancer. Allergies  Allergen Reactions  . Penicillins Other (See Comments)    Siblings are allergic   Current Outpatient Prescriptions on File Prior to Visit  Medication Sig Dispense Refill  . fluocinonide cream (LIDEX) 3.15 % Apply 1  application topically daily. 30 g 1   No current facility-administered medications on file prior to visit.   Review of Systems Constitutional: Negative for increased diaphoresis, other activity, appetite or siginficant weight change other than noted HENT: Negative for worsening hearing loss, ear pain, facial swelling, mouth sores and neck stiffness.   Eyes: Negative for other worsening pain, redness or visual disturbance.  Respiratory: Negative for shortness of breath and wheezing  Cardiovascular: Negative for chest pain and palpitations.  Gastrointestinal: Negative for diarrhea, blood in stool, abdominal distention or other pain Genitourinary: Negative for hematuria, flank pain or change in urine volume.  Musculoskeletal: Negative for myalgias or other joint complaints.  Skin: Negative for color change and wound or drainage.  Neurological: Negative for syncope and numbness. other than noted Hematological: Negative for adenopathy. or other swelling Psychiatric/Behavioral: Negative for hallucinations, SI, self-injury, decreased concentration or other worsening agitation.      Objective:   Physical Exam BP 122/80 mmHg  Pulse 53  Temp(Src) 98.3 F (36.8 C) (Oral)  Resp 18  Ht 6' (1.829 m)  Wt 218 lb 0.6 oz (98.902 kg)  BMI 29.56 kg/m2  SpO2 95% VS noted,  Constitutional: Pt is oriented to person, place, and time. Appears well-developed and well-nourished, in no significant distress Head: Normocephalic and atraumatic.  Right Ear: External ear normal.  Left Ear: External ear normal.  Nose: Nose normal.  Mouth/Throat: Oropharynx is clear and moist.  Bilat tm's with mild erythema.  Max sinus areas non tender.  Pharynx with mild erythema, no  exudate Eyes: Conjunctivae and EOM are normal. Pupils are equal, round, and reactive to light.  Neck: Normal range of motion. Neck supple. No JVD present. No tracheal deviation present or significant neck LA or mass Cardiovascular: Normal rate,  regular rhythm, normal heart sounds and intact distal pulses.   Pulmonary/Chest: Effort normal and breath sounds without rales or wheezing  Abdominal: Soft. Bowel sounds are normal. NT. No HSM  Musculoskeletal: Normal range of motion. Exhibits no edema.  Lymphadenopathy:  Has no cervical adenopathy.  Neurological: Pt is alert and oriented to person, place, and time. Pt has normal reflexes. No cranial nerve deficit. Motor grossly intact Skin: Skin is warm and dry. No rash noted.  Psychiatric:  Has normal mood and affect. Behavior is normal.     Assessment & Plan:

## 2014-08-31 NOTE — Assessment & Plan Note (Signed)

## 2014-08-31 NOTE — Addendum Note (Signed)
Addended by: Valerie Salts on: 08/31/2014 04:17 PM   Modules accepted: Orders

## 2014-08-31 NOTE — Addendum Note (Signed)
Addended by: Biagio Borg on: 08/31/2014 04:02 PM   Modules accepted: Orders

## 2014-09-01 ENCOUNTER — Encounter: Payer: Self-pay | Admitting: Gastroenterology

## 2014-10-15 ENCOUNTER — Ambulatory Visit (AMBULATORY_SURGERY_CENTER): Payer: Self-pay | Admitting: *Deleted

## 2014-10-15 VITALS — Ht 72.0 in | Wt 218.8 lb

## 2014-10-15 DIAGNOSIS — Z86010 Personal history of colonic polyps: Secondary | ICD-10-CM

## 2014-10-15 MED ORDER — MOVIPREP 100 G PO SOLR: 1.0000 | Freq: Once | ORAL | Status: DC

## 2014-10-15 NOTE — Progress Notes (Signed)
No egg or soy allergy No issues with past sedation No diet pills No home 02 use     

## 2014-10-29 ENCOUNTER — Ambulatory Visit (AMBULATORY_SURGERY_CENTER): Payer: Federal, State, Local not specified - PPO | Admitting: Gastroenterology

## 2014-10-29 ENCOUNTER — Encounter: Payer: Self-pay | Admitting: Gastroenterology

## 2014-10-29 ENCOUNTER — Other Ambulatory Visit: Payer: Self-pay | Admitting: Gastroenterology

## 2014-10-29 VITALS — BP 103/61 | HR 55 | Temp 98.0°F | Resp 14 | Ht 72.0 in | Wt 218.0 lb

## 2014-10-29 DIAGNOSIS — Z86010 Personal history of colonic polyps: Secondary | ICD-10-CM | POA: Diagnosis not present

## 2014-10-29 DIAGNOSIS — D122 Benign neoplasm of ascending colon: Secondary | ICD-10-CM | POA: Diagnosis not present

## 2014-10-29 MED ORDER — SODIUM CHLORIDE 0.9 % IV SOLN: 500.0000 mL | INTRAVENOUS | Status: DC

## 2014-10-29 NOTE — Patient Instructions (Signed)
YOU HAD AN ENDOSCOPIC PROCEDURE TODAY AT [facility]:   Refer to the procedure report that was given to you for any specific questions about what was found during the examination.  If the procedure report does not answer your questions, please call your gastroenterologist to clarify.  If you requested that your care partner not be given the details of your procedure findings, then the procedure report has been included in a sealed envelope for you to review at your convenience later.  YOU SHOULD EXPECT: Some feelings of bloating in the abdomen. Passage of more gas than usual.  Walking can help get rid of the air that was put into your GI tract during the procedure and reduce the bloating. If you had a lower endoscopy (such as a colonoscopy or flexible sigmoidoscopy) you may notice spotting of blood in your stool or on the toilet paper. If you underwent a bowel prep for your procedure, you may not have a normal bowel movement for a few days.  Please Note:  You might notice some irritation and congestion in your nose or some drainage.  This is from the oxygen used during your procedure.  There is no need for concern and it should clear up in a day or so.  SYMPTOMS TO REPORT IMMEDIATELY:   Following lower endoscopy (colonoscopy or flexible sigmoidoscopy):  Excessive amounts of blood in the stool  Significant tenderness or worsening of abdominal pains  Swelling of the abdomen that is new, acute  Fever of 100F or higher  For urgent or emergent issues, a gastroenterologist can be reached at any hour by calling [phone].  DIET: Your first meal following the procedure should be a small meal and then it is ok to progress to your normal diet. Heavy or fried foods are harder to digest and may make you feel nauseous or bloated.  Likewise, meals heavy in dairy and vegetables can increase bloating.  Drink plenty of fluids but you should avoid alcoholic beverages for 24 hours.  ACTIVITY:   You should plan to take it easy for the rest of today and you should NOT DRIVE or use heavy machinery until tomorrow (because of the sedation medicines used during the test).    FOLLOW UP: Our staff will call the number listed on your records the next business day following your procedure to check on you and address any questions or concerns that you may have regarding the information given to you following your procedure. If we do not reach you, we will leave a message.  However, if you are feeling well and you are not experiencing any problems, there is no need to return our call.  We will assume that you have returned to your regular daily activities without incident.  If any biopsies were taken you will be contacted by phone or by letter within the next 1-3 weeks.  Please call us at [phone] if you have not heard about the biopsies in 3 weeks.   SIGNATURES/CONFIDENTIALITY: You and/or your care partner have signed paperwork which will be entered into your electronic medical record.  These signatures attest to the fact that that the information above on your After Visit Summary has been reviewed and is understood.  Full responsibility of the confidentiality of this discharge information lies with you and/or your care-partner.  Continue your normal medications  Please read over handout about polyps

## 2014-10-29 NOTE — Progress Notes (Signed)
Stable to RR 

## 2014-10-29 NOTE — Op Note (Signed)
[place]  [address] [place], [id number]   COLONOSCOPY PROCEDURE REPORT  PATIENT: Richard [name], Richard [name]  MR#: [id number] BIRTHDATE: September 19, 1955 , 59  yrs. old GENDER: male ENDOSCOPIST: Milus Banister, MD PROCEDURE DATE:  10/29/2014 PROCEDURE:   Colonoscopy with snare polypectomy First Screening Colonoscopy - Avg.  risk and is 50 yrs.  old or older - No.  Prior Negative Screening - Now for repeat screening. N/A  History of Adenoma - Now for follow-up colonoscopy & has been > or = to 3 yrs.  Yes hx of adenoma.  Has been 3 or more years since last colonoscopy.  Polyps removed today? Yes ASA CLASS:   Class II INDICATIONS:Surveillance due to prior colonic neoplasia (three small adenomas removed 2013). MEDICATIONS: Monitored anesthesia care, Propofol 300 mg IV, and lidocaine 40mg  IV  DESCRIPTION OF PROCEDURE:   After the risks benefits and alternatives of the procedure were thoroughly explained, informed consent was obtained.  The digital rectal exam revealed no abnormalities of the rectum.   The LB [id number]  endoscope was introduced through the anus and advanced to the cecum, which was identified by both the appendix and ileocecal valve. No adverse events experienced.   The quality of the prep was excellent.  The instrument was then slowly withdrawn as the colon was fully examined.   COLON FINDINGS: A sessile polyp measuring 3 mm in size was found in the ascending colon.  A polypectomy was performed with a cold snare.  The resection was complete, the polyp tissue was completely retrieved and sent to histology.   The examination was otherwise normal.  Retroflexed views revealed no abnormalities. The time to cecum = 2.7 Withdrawal time = 8.3   The scope was withdrawn and the procedure completed. COMPLICATIONS: There were no immediate complications.  ENDOSCOPIC IMPRESSION: 1.   Sessile polyp was found in the ascending colon; polypectomy was performed with a  cold snare 2.   The examination was otherwise normal  RECOMMENDATIONS: 1.  Given your personal history of adenomatous (pre-cancerous) polyps, you will need a repeat colonoscopy in 5 years even if the polyp removed today is NOT precancerous.  You will receive a letter within 1-2 weeks with the results of your biopsy as well as final recommendations.  Please call my office if you have not received a letter after 3 weeks.  eSigned:  Milus Banister, MD 10/29/2014 1:40 PM   cc: Cathlean Cower, MD

## 2014-10-29 NOTE — Progress Notes (Signed)
Called to room to assist during endoscopic procedure.  Patient ID and intended procedure confirmed with present staff. Received instructions for my participation in the procedure from the performing physician.  

## 2014-11-01 ENCOUNTER — Telehealth: Payer: Self-pay | Admitting: *Deleted

## 2014-11-01 NOTE — Telephone Encounter (Signed)
  Follow up Call-  Call back number 10/29/2014  Post procedure Call Back phone  # [phone]  Permission to leave phone message Yes     Patient questions:  Do you have a fever, pain , or abdominal swelling? No. Pain Score  0 *  Have you tolerated food without any problems? Yes.    Have you been able to return to your normal activities? Yes.    Do you have any questions about your discharge instructions: Diet   No. Medications  No. Follow up visit  No.  Do you have questions or concerns about your Care? No.  Actions: * If pain score is 4 or above: No action needed, pain <4.

## 2014-11-08 ENCOUNTER — Encounter: Payer: Self-pay | Admitting: Gastroenterology

## 2015-09-08 ENCOUNTER — Other Ambulatory Visit: Payer: Self-pay | Admitting: Internal Medicine

## 2015-10-01 ENCOUNTER — Other Ambulatory Visit: Payer: Self-pay | Admitting: Internal Medicine

## 2015-12-22 DIAGNOSIS — D2262 Melanocytic nevi of left upper limb, including shoulder: Secondary | ICD-10-CM | POA: Diagnosis not present

## 2015-12-22 DIAGNOSIS — L814 Other melanin hyperpigmentation: Secondary | ICD-10-CM | POA: Diagnosis not present

## 2015-12-22 DIAGNOSIS — D2261 Melanocytic nevi of right upper limb, including shoulder: Secondary | ICD-10-CM | POA: Diagnosis not present

## 2015-12-22 DIAGNOSIS — D225 Melanocytic nevi of trunk: Secondary | ICD-10-CM | POA: Diagnosis not present

## 2016-04-18 DIAGNOSIS — Z23 Encounter for immunization: Secondary | ICD-10-CM | POA: Diagnosis not present

## 2016-06-03 ENCOUNTER — Other Ambulatory Visit: Payer: Self-pay | Admitting: Internal Medicine

## 2016-06-19 ENCOUNTER — Other Ambulatory Visit: Payer: Self-pay | Admitting: Internal Medicine

## 2016-06-25 ENCOUNTER — Other Ambulatory Visit: Payer: Self-pay | Admitting: Internal Medicine

## 2016-06-26 DIAGNOSIS — H524 Presbyopia: Secondary | ICD-10-CM | POA: Diagnosis not present

## 2016-06-26 DIAGNOSIS — H25013 Cortical age-related cataract, bilateral: Secondary | ICD-10-CM | POA: Diagnosis not present

## 2016-09-26 ENCOUNTER — Other Ambulatory Visit: Payer: Self-pay | Admitting: Internal Medicine

## 2016-09-26 NOTE — Telephone Encounter (Signed)
Unfortunately I am unable to refill due to office policy regarding med refills - pt last seen mar 2016  Pt needs OV

## 2016-10-18 ENCOUNTER — Other Ambulatory Visit (INDEPENDENT_AMBULATORY_CARE_PROVIDER_SITE_OTHER): Payer: Federal, State, Local not specified - PPO

## 2016-10-18 ENCOUNTER — Ambulatory Visit (INDEPENDENT_AMBULATORY_CARE_PROVIDER_SITE_OTHER): Payer: Federal, State, Local not specified - PPO | Admitting: Internal Medicine

## 2016-10-18 ENCOUNTER — Ambulatory Visit (INDEPENDENT_AMBULATORY_CARE_PROVIDER_SITE_OTHER)
Admission: RE | Admit: 2016-10-18 | Discharge: 2016-10-18 | Disposition: A | Discharge status: Home / Self Care | Payer: Federal, State, Local not specified - PPO | Source: Ambulatory Visit | Attending: Internal Medicine | Admitting: Internal Medicine

## 2016-10-18 ENCOUNTER — Encounter: Payer: Self-pay | Admitting: Internal Medicine

## 2016-10-18 VITALS — BP 112/64 | HR 60 | Ht 72.0 in | Wt 210.0 lb

## 2016-10-18 DIAGNOSIS — I10 Essential (primary) hypertension: Secondary | ICD-10-CM | POA: Diagnosis not present

## 2016-10-18 DIAGNOSIS — Z0001 Encounter for general adult medical examination with abnormal findings: Secondary | ICD-10-CM | POA: Diagnosis not present

## 2016-10-18 DIAGNOSIS — R062 Wheezing: Secondary | ICD-10-CM

## 2016-10-18 DIAGNOSIS — E785 Hyperlipidemia, unspecified: Secondary | ICD-10-CM

## 2016-10-18 DIAGNOSIS — Z114 Encounter for screening for human immunodeficiency virus [HIV]: Secondary | ICD-10-CM

## 2016-10-18 DIAGNOSIS — J309 Allergic rhinitis, unspecified: Secondary | ICD-10-CM

## 2016-10-18 DIAGNOSIS — Z1159 Encounter for screening for other viral diseases: Secondary | ICD-10-CM

## 2016-10-18 DIAGNOSIS — R05 Cough: Secondary | ICD-10-CM | POA: Diagnosis not present

## 2016-10-18 DIAGNOSIS — R059 Cough, unspecified: Secondary | ICD-10-CM | POA: Insufficient documentation

## 2016-10-18 LAB — CBC WITH DIFFERENTIAL/PLATELET
BASOS PCT: 0.4 % (ref 0.0–3.0)
Basophils Absolute: 0 10*3/uL (ref 0.0–0.1)
Eosinophils Absolute: 0.1 10*3/uL (ref 0.0–0.7)
Eosinophils Relative: 1 % (ref 0.0–5.0)
HEMATOCRIT: 47.5 % (ref 39.0–52.0)
Hemoglobin: 16 g/dL (ref 13.0–17.0)
LYMPHS ABS: 1.8 10*3/uL (ref 0.7–4.0)
LYMPHS PCT: 22.8 % (ref 12.0–46.0)
MCHC: 33.8 g/dL (ref 30.0–36.0)
MCV: 90.8 fl (ref 78.0–100.0)
MONOS PCT: 8.2 % (ref 3.0–12.0)
Monocytes Absolute: 0.7 10*3/uL (ref 0.1–1.0)
NEUTROS ABS: 5.4 10*3/uL (ref 1.4–7.7)
NEUTROS PCT: 67.6 % (ref 43.0–77.0)
PLATELETS: 238 10*3/uL (ref 150.0–400.0)
RBC: 5.23 Mil/uL (ref 4.22–5.81)
RDW: 13.4 % (ref 11.5–15.5)
WBC: 8 10*3/uL (ref 4.0–10.5)

## 2016-10-18 LAB — BASIC METABOLIC PANEL
BUN: 14 mg/dL (ref 6–23)
CALCIUM: 9.7 mg/dL (ref 8.4–10.5)
CHLORIDE: 106 meq/L (ref 96–112)
CO2: 24 mEq/L (ref 19–32)
CREATININE: 1.2 mg/dL (ref 0.40–1.50)
GFR: 65.14 mL/min (ref >60.00)
Glucose, Bld: 94 mg/dL (ref 70–99)
Potassium: 4.2 mEq/L (ref 3.5–5.1)
SODIUM: 138 meq/L (ref 135–145)

## 2016-10-18 LAB — HEPATIC FUNCTION PANEL
ALK PHOS: 84 U/L (ref 39–117)
ALT: 18 U/L (ref 0–53)
AST: 16 U/L (ref 0–37)
Albumin: 4.8 g/dL (ref 3.5–5.2)
BILIRUBIN TOTAL: 0.9 mg/dL (ref 0.2–1.2)
Bilirubin, Direct: 0.1 mg/dL (ref 0.0–0.3)
Total Protein: 7.4 g/dL (ref 6.0–8.3)

## 2016-10-18 LAB — URINALYSIS, ROUTINE W REFLEX MICROSCOPIC
Bilirubin Urine: NEGATIVE
Hgb urine dipstick: NEGATIVE
Ketones, ur: NEGATIVE
Leukocytes, UA: NEGATIVE
Nitrite: NEGATIVE
RBC / HPF: NONE SEEN (ref 0–?)
SPECIFIC GRAVITY, URINE: 1.015 (ref 1.000–1.030)
Total Protein, Urine: NEGATIVE
Urine Glucose: NEGATIVE
Urobilinogen, UA: 0.2 (ref 0.0–1.0)
WBC UA: NONE SEEN (ref 0–?)
pH: 5.5 (ref 5.0–8.0)

## 2016-10-18 LAB — LIPID PANEL
CHOL/HDL RATIO: 6
Cholesterol: 263 mg/dL — ABNORMAL HIGH (ref 0–200)
HDL: 42.3 mg/dL (ref >39.00)
LDL Cholesterol: 195 mg/dL — ABNORMAL HIGH (ref 0–99)
NonHDL: 221.06
Triglycerides: 129 mg/dL (ref 0.0–149.0)
VLDL: 25.8 mg/dL (ref 0.0–40.0)

## 2016-10-18 LAB — PSA: PSA: 2.12 ng/mL (ref 0.10–4.00)

## 2016-10-18 LAB — TSH: TSH: 0.96 u[IU]/mL (ref 0.35–4.50)

## 2016-10-18 MED ORDER — PREDNISONE 10 MG PO TABS: ORAL_TABLET | ORAL | 0 refills | Status: DC

## 2016-10-18 MED ORDER — ATENOLOL 25 MG PO TABS: 25.0000 mg | ORAL_TABLET | Freq: Two times a day (BID) | ORAL | 3 refills | Status: DC

## 2016-10-18 MED ORDER — DILTIAZEM HCL ER 240 MG PO CP24: 240.0000 mg | ORAL_CAPSULE | Freq: Every day | ORAL | 3 refills | Status: DC

## 2016-10-18 MED ORDER — MONTELUKAST SODIUM 10 MG PO TABS: 10.0000 mg | ORAL_TABLET | Freq: Every day | ORAL | 3 refills | Status: DC

## 2016-10-18 NOTE — Progress Notes (Signed)
Pre visit review using our clinic review tool, if applicable. No additional management support is needed unless otherwise documented below in the visit note. 

## 2016-10-18 NOTE — Assessment & Plan Note (Signed)
Having stopped his meds for 2 mo, cont diet, f/u lab today

## 2016-10-18 NOTE — Assessment & Plan Note (Signed)

## 2016-10-18 NOTE — Assessment & Plan Note (Addendum)
Mild ongoing, for singulair 10 qd,  to f/u any worsening symptoms or concerns  In addition to the time spent performing CPE, I spent an additional 15 minutes face to face,in which greater than 50% of this time was spent in counseling and coordination of care for patient's illness as documented., including differential diagnosis, evalution and tx for allergic rhinitis, cough, wheezing, possible familial HLD, and HTN management

## 2016-10-18 NOTE — Assessment & Plan Note (Signed)
?   Asthma vs other - for prednisone 20 qd x 5 days

## 2016-10-18 NOTE — Assessment & Plan Note (Signed)
stable overall by history and exam, recent data reviewed with pt, and pt to continue medical treatment as before,  to f/u any worsening symptoms or concerns BP Readings from Last 3 Encounters:  10/18/16 112/64  10/29/14 103/61  08/31/14 122/80

## 2016-10-18 NOTE — Progress Notes (Signed)
Subjective:    Patient ID: Richard [name], male    DOB: Jul 24, 1955, 61 y.o.   MRN: [id number]  HPI  Here for wellness and f/u;  Overall doing ok;  Pt denies Chest pain, orthopnea, PND, worsening LE edema, palpitations, dizziness or syncope.  Pt denies neurological change such as new headache, facial or extremity weakness.  Pt denies polydipsia, polyuria, or low sugar symptoms. Pt states overall good compliance with treatment and medications, good tolerability, and has been trying to follow appropriate diet.  Pt denies worsening depressive symptoms, suicidal ideation or panic. No fever, night sweats, wt loss, loss of appetite, or other constitutional symptoms.  Pt states good ability with ADL's, has low fall risk, home safety reviewed and adequate, no other significant changes in hearing or vision, and occasionally active with exercise, wt overall stable. Admits to working with diet with lower chol, so has not been taking the trilipix and lipitor x 2 mo.  Does have several wks ongoing nasal allergy symptoms with clearish congestion, itch and sneezing, without fever, pain, ST, swelling, but after smoke exposure at his daughters house in the [place] has also prod cough large sputum per pt, some improved in last few days.  No hoarseness, no fever or other worsening pain.  Has been occas sob.  Past Medical History:  Diagnosis Date  . Alcoholism in remission ([place]) 08/25/2011  . Allergy    seasonal  . CAD (coronary artery disease) 08/25/2011  . Depression   . Hyperlipidemia   . Hypertension   . Sleep apnea    did use cpap about 10 years ago no use now   Past Surgical History:  Procedure Laterality Date  . COLONOSCOPY  10-22-11   3 polyps  . FINGER NEUROPLASTY     left index finger  . left achillies  2003  . POLYPECTOMY      reports that he has never smoked. He has never used smokeless tobacco. He reports that he does not drink alcohol or use drugs. family history includes Diabetes in his mother;  Heart attack in his brother; Hypertension in his mother. Allergies  Allergen Reactions  . Penicillins Other (See Comments)    Siblings are allergic   Current Outpatient Prescriptions on File Prior to Visit  Medication Sig Dispense Refill  . fluocinonide cream (LIDEX) 6.29 % Apply 1 application topically daily. 30 g 1  . triamcinolone (NASACORT AQ) 55 MCG/ACT AERO nasal inhaler Place 2 sprays into the nose daily. 1 Inhaler 12   No current facility-administered medications on file prior to visit.    Review of Systems Constitutional: Negative for other unusual diaphoresis, sweats, appetite or weight changes HENT: Negative for other worsening hearing loss, ear pain, facial swelling, mouth sores or neck stiffness.   Eyes: Negative for other worsening pain, redness or other visual disturbance.  Respiratory: Negative for other stridor or swelling Cardiovascular: Negative for other palpitations or other chest pain  Gastrointestinal: Negative for worsening diarrhea or loose stools, blood in stool, distention or other pain Genitourinary: Negative for hematuria, flank pain or other change in urine volume.  Musculoskeletal: Negative for myalgias or other joint swelling.  Skin: Negative for other color change, or other wound or worsening drainage.  Neurological: Negative for other syncope or numbness. Hematological: Negative for other adenopathy or swelling Psychiatric/Behavioral: Negative for hallucinations, other worsening agitation, SI, self-injury, or new decreased concentration All other system neg per pt    Objective:   Physical Exam BP 112/64   Pulse 60  Ht 6' (1.829 m)   Wt 210 lb (95.3 kg)   SpO2 98%   BMI 28.48 kg/m  VS noted, not ill appearing Bilat tm's with mild erythema.  Max sinus areas non tender.  Pharynx with mild erythema, no exudate Constitutional: Pt is oriented to person, place, and time. Appears well-developed and well-nourished, in no significant distress and  comfortable Head: Normocephalic and atraumatic  Eyes: Conjunctivae and EOM are normal. Pupils are equal, round, and reactive to light Right Ear: External ear normal without discharge Left Ear: External ear normal without discharge Nose: Nose without discharge or deformity Mouth/Throat: Oropharynx is without other ulcerations and moist  Neck: Normal range of motion. Neck supple. No JVD present. No tracheal deviation present or significant neck LA or mass Cardiovascular: Normal rate, regular rhythm, normal heart sounds and intact distal pulses.   Pulmonary/Chest: WOB normal and breath sounds without rales or wheezing except bilat decreased somewhat with Right basilar few wheezes Abdominal: Soft. Bowel sounds are normal. NT. No HSM  Musculoskeletal: Normal range of motion. Exhibits no edema Lymphadenopathy: Has no other cervical adenopathy.  Neurological: Pt is alert and oriented to person, place, and time. Pt has normal reflexes. No cranial nerve deficit. Motor grossly intact, Gait intact Skin: Skin is warm and dry. No rash noted or new ulcerations Psychiatric:  Has normal mood and affect. Behavior is normal without agitation No other exam findings  Lab Results  Component Value Date   WBC 7.4 08/31/2014   HGB 16.4 08/31/2014   HCT 47.6 08/31/2014   PLT 199.0 08/31/2014   GLUCOSE 89 08/31/2014   CHOL 279 (H) 08/31/2014   TRIG 134.0 08/31/2014   HDL 40.80 08/31/2014   LDLDIRECT 146.2 08/30/2011   LDLCALC 211 (H) 08/31/2014   ALT 25 08/31/2014   AST 20 08/31/2014   NA 138 08/31/2014   K 4.0 08/31/2014   CL 107 08/31/2014   CREATININE 1.10 08/31/2014   BUN 15 08/31/2014   CO2 26 08/31/2014   TSH 1.30 08/31/2014   PSA 1.97 08/31/2014       Assessment & Plan:

## 2016-10-18 NOTE — Patient Instructions (Addendum)
Please take all new medication as prescribed - the singulair daily, and the 5 days of prednisone for allergies as well  Please continue all other medications as before, and refills have been done if requested.  Please have the pharmacy call with any other refills you may need.  Please continue your efforts at being more active, low cholesterol diet, and weight control.  You are otherwise up to date with prevention measures today.  Please keep your appointments with your specialists as you may have planned  Please go to the XRAY Department in the Basement (go straight as you get off the elevator) for the x-ray testing  Please go to the LAB in the Basement (turn left off the elevator) for the tests to be done today  You will be contacted by phone if any changes need to be made immediately.  Otherwise, you will receive a letter about your results with an explanation, but please check with MyChart first.  Please remember to sign up for MyChart if you have not done so, as this will be important to you in the future with finding out test results, communicating by private email, and scheduling acute appointments online when needed.  Please return in 1 year for your yearly visit, or sooner if needed, with Lab testing done 3-5 days before

## 2016-10-19 ENCOUNTER — Other Ambulatory Visit: Payer: Self-pay | Admitting: Internal Medicine

## 2016-10-19 ENCOUNTER — Telehealth: Payer: Self-pay

## 2016-10-19 ENCOUNTER — Telehealth: Payer: Self-pay | Admitting: Internal Medicine

## 2016-10-19 LAB — HEPATITIS C ANTIBODY: HCV AB: NEGATIVE

## 2016-10-19 LAB — HIV ANTIBODY (ROUTINE TESTING W REFLEX): HIV: NONREACTIVE

## 2016-10-19 MED ORDER — ATORVASTATIN CALCIUM 80 MG PO TABS: 80.0000 mg | ORAL_TABLET | Freq: Every day | ORAL | 3 refills | Status: DC

## 2016-10-19 NOTE — Telephone Encounter (Signed)
Called pt but VM has not been set up yet to received msgs.

## 2016-10-19 NOTE — Telephone Encounter (Signed)
-----   Message from Biagio Borg, MD sent at 10/19/2016  8:32 AM EDT ----- Left message on MyChart, pt to cont same tx except  The test results show that your current treatment is OK, except the LDL is still very high as I suspected.  Please continue your dietary efforts, but we will need to restart the Lipitor at 80 mg per day.  I think you can stay off the trilipix for now.  I will send a prescription, and you should hear from the office as well. Redmond Baseman to please inform pt, I will do rx

## 2016-10-19 NOTE — Telephone Encounter (Signed)
Pt called and wanted to make sure Jenny Reichmann was aware pt has a piece of metal in chest from a hammer head from 1970. It will show up in his images from yesterday.

## 2016-10-19 NOTE — Telephone Encounter (Signed)
Ok, this is noted, thanks

## 2016-11-15 DIAGNOSIS — K080 Exfoliation of teeth due to systemic causes: Secondary | ICD-10-CM | POA: Diagnosis not present

## 2016-11-21 DIAGNOSIS — K080 Exfoliation of teeth due to systemic causes: Secondary | ICD-10-CM | POA: Diagnosis not present

## 2017-03-06 DIAGNOSIS — L280 Lichen simplex chronicus: Secondary | ICD-10-CM | POA: Diagnosis not present

## 2017-03-06 DIAGNOSIS — D2262 Melanocytic nevi of left upper limb, including shoulder: Secondary | ICD-10-CM | POA: Diagnosis not present

## 2017-03-06 DIAGNOSIS — D2261 Melanocytic nevi of right upper limb, including shoulder: Secondary | ICD-10-CM | POA: Diagnosis not present

## 2017-03-06 DIAGNOSIS — L731 Pseudofolliculitis barbae: Secondary | ICD-10-CM | POA: Diagnosis not present

## 2017-07-17 DIAGNOSIS — K080 Exfoliation of teeth due to systemic causes: Secondary | ICD-10-CM | POA: Diagnosis not present

## 2017-08-26 DIAGNOSIS — K080 Exfoliation of teeth due to systemic causes: Secondary | ICD-10-CM | POA: Diagnosis not present

## 2017-10-12 ENCOUNTER — Other Ambulatory Visit: Payer: Self-pay | Admitting: Internal Medicine

## 2017-10-23 ENCOUNTER — Encounter: Payer: Federal, State, Local not specified - PPO | Admitting: Internal Medicine

## 2017-11-01 ENCOUNTER — Encounter: Payer: Self-pay | Admitting: Internal Medicine

## 2017-11-01 ENCOUNTER — Ambulatory Visit (INDEPENDENT_AMBULATORY_CARE_PROVIDER_SITE_OTHER): Payer: Federal, State, Local not specified - PPO | Admitting: Internal Medicine

## 2017-11-01 ENCOUNTER — Other Ambulatory Visit (INDEPENDENT_AMBULATORY_CARE_PROVIDER_SITE_OTHER): Payer: Federal, State, Local not specified - PPO

## 2017-11-01 VITALS — BP 116/68 | HR 60 | Temp 98.3°F | Ht 72.0 in | Wt 212.0 lb

## 2017-11-01 DIAGNOSIS — Z0000 Encounter for general adult medical examination without abnormal findings: Secondary | ICD-10-CM | POA: Diagnosis not present

## 2017-11-01 DIAGNOSIS — I10 Essential (primary) hypertension: Secondary | ICD-10-CM

## 2017-11-01 DIAGNOSIS — E785 Hyperlipidemia, unspecified: Secondary | ICD-10-CM

## 2017-11-01 LAB — LIPID PANEL
CHOL/HDL RATIO: 3
Cholesterol: 131 mg/dL (ref 0–200)
HDL: 40.1 mg/dL (ref >39.00)
LDL CALC: 75 mg/dL (ref 0–99)
NONHDL: 91.25
Triglycerides: 83 mg/dL (ref 0.0–149.0)
VLDL: 16.6 mg/dL (ref 0.0–40.0)

## 2017-11-01 LAB — HEPATIC FUNCTION PANEL
ALT: 27 U/L (ref 0–53)
AST: 21 U/L (ref 0–37)
Albumin: 4.5 g/dL (ref 3.5–5.2)
Alkaline Phosphatase: 79 U/L (ref 39–117)
BILIRUBIN DIRECT: 0.2 mg/dL (ref 0.0–0.3)
BILIRUBIN TOTAL: 0.9 mg/dL (ref 0.2–1.2)
Total Protein: 7 g/dL (ref 6.0–8.3)

## 2017-11-01 LAB — CBC WITH DIFFERENTIAL/PLATELET
BASOS PCT: 0.7 % (ref 0.0–3.0)
Basophils Absolute: 0.1 10*3/uL (ref 0.0–0.1)
EOS ABS: 0.1 10*3/uL (ref 0.0–0.7)
Eosinophils Relative: 1 % (ref 0.0–5.0)
HCT: 45.7 % (ref 39.0–52.0)
HEMOGLOBIN: 15.3 g/dL (ref 13.0–17.0)
LYMPHS ABS: 1.9 10*3/uL (ref 0.7–4.0)
Lymphocytes Relative: 23.9 % (ref 12.0–46.0)
MCHC: 33.5 g/dL (ref 30.0–36.0)
MCV: 91.7 fl (ref 78.0–100.0)
MONO ABS: 0.8 10*3/uL (ref 0.1–1.0)
Monocytes Relative: 9.5 % (ref 3.0–12.0)
NEUTROS PCT: 64.9 % (ref 43.0–77.0)
Neutro Abs: 5.3 10*3/uL (ref 1.4–7.7)
Platelets: 181 10*3/uL (ref 150.0–400.0)
RBC: 4.98 Mil/uL (ref 4.22–5.81)
RDW: 13.7 % (ref 11.5–15.5)
WBC: 8.1 10*3/uL (ref 4.0–10.5)

## 2017-11-01 LAB — URINALYSIS, ROUTINE W REFLEX MICROSCOPIC
BILIRUBIN URINE: NEGATIVE
Hgb urine dipstick: NEGATIVE
LEUKOCYTES UA: NEGATIVE
NITRITE: NEGATIVE
RBC / HPF: NONE SEEN (ref 0–?)
Specific Gravity, Urine: >=1.03 — AB (ref 1.000–1.030)
URINE GLUCOSE: NEGATIVE
Urobilinogen, UA: 0.2 (ref 0.0–1.0)
pH: 5.5 (ref 5.0–8.0)

## 2017-11-01 LAB — BASIC METABOLIC PANEL
BUN: 20 mg/dL (ref 6–23)
CHLORIDE: 105 meq/L (ref 96–112)
CO2: 26 meq/L (ref 19–32)
Calcium: 9.3 mg/dL (ref 8.4–10.5)
Creatinine, Ser: 1.42 mg/dL (ref 0.40–1.50)
GFR: 53.46 mL/min — AB (ref >60.00)
GLUCOSE: 93 mg/dL (ref 70–99)
POTASSIUM: 4.3 meq/L (ref 3.5–5.1)
SODIUM: 139 meq/L (ref 135–145)

## 2017-11-01 LAB — TSH: TSH: 1.16 u[IU]/mL (ref 0.35–4.50)

## 2017-11-01 LAB — PSA: PSA: 1.9 ng/mL (ref 0.10–4.00)

## 2017-11-01 MED ORDER — MONTELUKAST SODIUM 10 MG PO TABS: 10.0000 mg | ORAL_TABLET | Freq: Every day | ORAL | 3 refills | Status: DC

## 2017-11-01 MED ORDER — TRIAMCINOLONE ACETONIDE 55 MCG/ACT NA AERO: 2.0000 | INHALATION_SPRAY | Freq: Every day | NASAL | 12 refills | Status: DC

## 2017-11-01 MED ORDER — ATENOLOL 25 MG PO TABS: 25.0000 mg | ORAL_TABLET | Freq: Two times a day (BID) | ORAL | 3 refills | Status: DC

## 2017-11-01 MED ORDER — DILTIAZEM HCL ER 240 MG PO CP24: ORAL_CAPSULE | ORAL | 3 refills | Status: DC

## 2017-11-01 MED ORDER — ATORVASTATIN CALCIUM 80 MG PO TABS: 80.0000 mg | ORAL_TABLET | Freq: Every day | ORAL | 3 refills | Status: DC

## 2017-11-01 NOTE — Progress Notes (Signed)
Subjective:    Patient ID: Richard [name], male    DOB: 1956/04/18, 62 y.o.   MRN: [id number]  HPI  Here for wellness and f/u;  Overall doing ok;  Pt denies Chest pain, worsening SOB, DOE, wheezing, orthopnea, PND, worsening LE edema, palpitations, dizziness or syncope.  Pt denies neurological change such as new headache, facial or extremity weakness.  Pt denies polydipsia, polyuria, or low sugar symptoms. Pt states overall good compliance with treatment and medications, good tolerability, and has been trying to follow appropriate diet.  Pt denies worsening depressive symptoms, suicidal ideation or panic. No fever, night sweats, wt loss, loss of appetite, or other constitutional symptoms.  Pt states good ability with ADL's, has low fall risk, home safety reviewed and adequate, no other significant changes in hearing or vision, and only occasionally active with exercise.  Plays golf and surf fishing.  Has been intermittent with meds but plans to do better.  Plans to take the statin daily  No other new complaint or interval hx Past Medical History:  Diagnosis Date  . Alcoholism in remission ([place]) 08/25/2011  . Allergy    seasonal  . CAD (coronary artery disease) 08/25/2011  . Depression   . Hyperlipidemia   . Hypertension   . Sleep apnea    did use cpap about 10 years ago no use now   Past Surgical History:  Procedure Laterality Date  . COLONOSCOPY  10-22-11   3 polyps  . FINGER NEUROPLASTY     left index finger  . left achillies  2003  . POLYPECTOMY      reports that he has never smoked. He has never used smokeless tobacco. He reports that he does not drink alcohol or use drugs. family history includes Diabetes in his mother; Heart attack in his brother; Hypertension in his mother. Allergies  Allergen Reactions  . Penicillins Other (See Comments)    Siblings are allergic   No current outpatient medications on file prior to visit.   No current facility-administered medications on file  prior to visit.    Review of Systems Constitutional: Negative for other unusual diaphoresis, sweats, appetite or weight changes HENT: Negative for other worsening hearing loss, ear pain, facial swelling, mouth sores or neck stiffness.   Eyes: Negative for other worsening pain, redness or other visual disturbance.  Respiratory: Negative for other stridor or swelling Cardiovascular: Negative for other palpitations or other chest pain  Gastrointestinal: Negative for worsening diarrhea or loose stools, blood in stool, distention or other pain Genitourinary: Negative for hematuria, flank pain or other change in urine volume.  Musculoskeletal: Negative for myalgias or other joint swelling.  Skin: Negative for other color change, or other wound or worsening drainage.  Neurological: Negative for other syncope or numbness. Hematological: Negative for other adenopathy or swelling Psychiatric/Behavioral: Negative for hallucinations, other worsening agitation, SI, self-injury, or new decreased concentration All other system neg per pt    Objective:   Physical Exam BP 116/68   Pulse 60   Temp 98.3 F (36.8 C) (Oral)   Ht 6' (1.829 m)   Wt 212 lb (96.2 kg)   SpO2 98%   BMI 28.75 kg/m  VS noted,  Constitutional: Pt is oriented to person, place, and time. Appears well-developed and well-nourished, in no significant distress and comfortable Head: Normocephalic and atraumatic  Eyes: Conjunctivae and EOM are normal. Pupils are equal, round, and reactive to light Right Ear: External ear normal without discharge Left Ear: External ear normal without  discharge Nose: Nose without discharge or deformity Mouth/Throat: Oropharynx is without other ulcerations and moist  Neck: Normal range of motion. Neck supple. No JVD present. No tracheal deviation present or significant neck LA or mass Cardiovascular: Normal rate, regular rhythm, normal heart sounds and intact distal pulses.   Pulmonary/Chest: WOB  normal and breath sounds without rales or wheezing  Abdominal: Soft. Bowel sounds are normal. NT. No HSM  Musculoskeletal: Normal range of motion. Exhibits no edema Lymphadenopathy: Has no other cervical adenopathy.  Neurological: Pt is alert and oriented to person, place, and time. Pt has normal reflexes. No cranial nerve deficit. Motor grossly intact, Gait intact Skin: Skin is warm and dry. No rash noted or new ulcerations Psychiatric:  Has normal mood and affect. Behavior is normal without agitation No other exam findings    Assessment & Plan:

## 2017-11-01 NOTE — Patient Instructions (Signed)

## 2017-11-02 ENCOUNTER — Encounter: Payer: Self-pay | Admitting: Internal Medicine

## 2017-11-02 NOTE — Assessment & Plan Note (Signed)
stable overall by history and exam, recent data reviewed with pt, and pt to continue medical treatment as before,  to f/u any worsening symptoms or concerns Lab Results  Component Value Date   LDLCALC 75 11/01/2017

## 2017-11-02 NOTE — Assessment & Plan Note (Signed)

## 2017-11-02 NOTE — Assessment & Plan Note (Signed)
stable overall by history and exam, recent data reviewed with pt, and pt to continue medical treatment as before,  to f/u any worsening symptoms or concerns BP Readings from Last 3 Encounters:  11/01/17 116/68  10/18/16 112/64  10/29/14 103/61

## 2018-01-05 ENCOUNTER — Other Ambulatory Visit: Payer: Self-pay

## 2018-01-05 ENCOUNTER — Inpatient Hospital Stay (HOSPITAL_COMMUNITY)
Admission: EM | Admit: 2018-01-05 | Discharge: 2018-01-07 | DRG: 064 | Disposition: A | Discharge status: Home / Self Care | Payer: Federal, State, Local not specified - PPO | Attending: Internal Medicine | Admitting: Internal Medicine

## 2018-01-05 ENCOUNTER — Emergency Department (HOSPITAL_COMMUNITY): Payer: Federal, State, Local not specified - PPO

## 2018-01-05 DIAGNOSIS — M549 Dorsalgia, unspecified: Secondary | ICD-10-CM | POA: Diagnosis not present

## 2018-01-05 DIAGNOSIS — I63412 Cerebral infarction due to embolism of left middle cerebral artery: Secondary | ICD-10-CM

## 2018-01-05 DIAGNOSIS — E663 Overweight: Secondary | ICD-10-CM | POA: Diagnosis present

## 2018-01-05 DIAGNOSIS — R471 Dysarthria and anarthria: Secondary | ICD-10-CM | POA: Diagnosis present

## 2018-01-05 DIAGNOSIS — G4733 Obstructive sleep apnea (adult) (pediatric): Secondary | ICD-10-CM | POA: Diagnosis not present

## 2018-01-05 DIAGNOSIS — I639 Cerebral infarction, unspecified: Secondary | ICD-10-CM | POA: Diagnosis present

## 2018-01-05 DIAGNOSIS — Z7982 Long term (current) use of aspirin: Secondary | ICD-10-CM

## 2018-01-05 DIAGNOSIS — R4702 Dysphasia: Secondary | ICD-10-CM | POA: Diagnosis present

## 2018-01-05 DIAGNOSIS — R29713 NIHSS score 13: Secondary | ICD-10-CM | POA: Diagnosis present

## 2018-01-05 DIAGNOSIS — M79641 Pain in right hand: Secondary | ICD-10-CM | POA: Diagnosis not present

## 2018-01-05 DIAGNOSIS — E7800 Pure hypercholesterolemia, unspecified: Secondary | ICD-10-CM | POA: Diagnosis not present

## 2018-01-05 DIAGNOSIS — I5021 Acute systolic (congestive) heart failure: Secondary | ICD-10-CM | POA: Diagnosis not present

## 2018-01-05 DIAGNOSIS — M25511 Pain in right shoulder: Secondary | ICD-10-CM | POA: Diagnosis not present

## 2018-01-05 DIAGNOSIS — Z8249 Family history of ischemic heart disease and other diseases of the circulatory system: Secondary | ICD-10-CM

## 2018-01-05 DIAGNOSIS — S4991XA Unspecified injury of right shoulder and upper arm, initial encounter: Secondary | ICD-10-CM | POA: Diagnosis not present

## 2018-01-05 DIAGNOSIS — R4781 Slurred speech: Secondary | ICD-10-CM | POA: Diagnosis not present

## 2018-01-05 DIAGNOSIS — R918 Other nonspecific abnormal finding of lung field: Secondary | ICD-10-CM | POA: Diagnosis not present

## 2018-01-05 DIAGNOSIS — M25421 Effusion, right elbow: Secondary | ICD-10-CM | POA: Diagnosis present

## 2018-01-05 DIAGNOSIS — G8191 Hemiplegia, unspecified affecting right dominant side: Secondary | ICD-10-CM | POA: Diagnosis not present

## 2018-01-05 DIAGNOSIS — Z833 Family history of diabetes mellitus: Secondary | ICD-10-CM

## 2018-01-05 DIAGNOSIS — W19XXXA Unspecified fall, initial encounter: Secondary | ICD-10-CM | POA: Diagnosis present

## 2018-01-05 DIAGNOSIS — I351 Nonrheumatic aortic (valve) insufficiency: Secondary | ICD-10-CM | POA: Diagnosis not present

## 2018-01-05 DIAGNOSIS — Z880 Allergy status to penicillin: Secondary | ICD-10-CM | POA: Diagnosis not present

## 2018-01-05 DIAGNOSIS — I6300 Cerebral infarction due to thrombosis of unspecified precerebral artery: Secondary | ICD-10-CM | POA: Diagnosis not present

## 2018-01-05 DIAGNOSIS — R4020 Unspecified coma: Secondary | ICD-10-CM | POA: Diagnosis not present

## 2018-01-05 DIAGNOSIS — I429 Cardiomyopathy, unspecified: Secondary | ICD-10-CM | POA: Diagnosis not present

## 2018-01-05 DIAGNOSIS — I10 Essential (primary) hypertension: Secondary | ICD-10-CM | POA: Diagnosis present

## 2018-01-05 DIAGNOSIS — R4701 Aphasia: Secondary | ICD-10-CM | POA: Diagnosis not present

## 2018-01-05 DIAGNOSIS — G8929 Other chronic pain: Secondary | ICD-10-CM | POA: Diagnosis not present

## 2018-01-05 DIAGNOSIS — I6340 Cerebral infarction due to embolism of unspecified cerebral artery: Principal | ICD-10-CM | POA: Diagnosis present

## 2018-01-05 DIAGNOSIS — I2510 Atherosclerotic heart disease of native coronary artery without angina pectoris: Secondary | ICD-10-CM | POA: Diagnosis present

## 2018-01-05 DIAGNOSIS — I4891 Unspecified atrial fibrillation: Secondary | ICD-10-CM

## 2018-01-05 DIAGNOSIS — I480 Paroxysmal atrial fibrillation: Secondary | ICD-10-CM | POA: Diagnosis present

## 2018-01-05 DIAGNOSIS — E785 Hyperlipidemia, unspecified: Secondary | ICD-10-CM | POA: Diagnosis not present

## 2018-01-05 DIAGNOSIS — I672 Cerebral atherosclerosis: Secondary | ICD-10-CM | POA: Diagnosis not present

## 2018-01-05 DIAGNOSIS — R29810 Facial weakness: Secondary | ICD-10-CM | POA: Diagnosis not present

## 2018-01-05 DIAGNOSIS — Z6828 Body mass index (BMI) 28.0-28.9, adult: Secondary | ICD-10-CM

## 2018-01-05 DIAGNOSIS — Z79899 Other long term (current) drug therapy: Secondary | ICD-10-CM

## 2018-01-05 DIAGNOSIS — I110 Hypertensive heart disease with heart failure: Secondary | ICD-10-CM | POA: Diagnosis present

## 2018-01-05 LAB — DIFFERENTIAL
Abs Immature Granulocytes: 0 10*3/uL (ref 0.0–0.1)
BASOS ABS: 0 10*3/uL (ref 0.0–0.1)
Basophils Relative: 0 %
EOS PCT: 0 %
Eosinophils Absolute: 0 10*3/uL (ref 0.0–0.7)
Immature Granulocytes: 0 %
LYMPHS PCT: 10 %
Lymphs Abs: 1.3 10*3/uL (ref 0.7–4.0)
MONO ABS: 0.9 10*3/uL (ref 0.1–1.0)
Monocytes Relative: 7 %
NEUTROS PCT: 83 %
Neutro Abs: 10.2 10*3/uL — ABNORMAL HIGH (ref 1.7–7.7)

## 2018-01-05 LAB — I-STAT CHEM 8, ED
BUN: 17 mg/dL (ref 8–23)
Calcium, Ion: 1.04 mmol/L — ABNORMAL LOW (ref 1.15–1.40)
Chloride: 110 mmol/L (ref 98–111)
Creatinine, Ser: 1.1 mg/dL (ref 0.61–1.24)
Glucose, Bld: 120 mg/dL — ABNORMAL HIGH (ref 70–99)
HEMATOCRIT: 48 % (ref 39.0–52.0)
HEMOGLOBIN: 16.3 g/dL (ref 13.0–17.0)
POTASSIUM: 3.6 mmol/L (ref 3.5–5.1)
Sodium: 142 mmol/L (ref 135–145)
TCO2: 19 mmol/L — ABNORMAL LOW (ref 22–32)

## 2018-01-05 LAB — COMPREHENSIVE METABOLIC PANEL
ALT: 31 U/L (ref 0–44)
ANION GAP: 15 (ref 5–15)
AST: 31 U/L (ref 15–41)
Albumin: 4.1 g/dL (ref 3.5–5.0)
Alkaline Phosphatase: 73 U/L (ref 38–126)
BUN: 15 mg/dL (ref 8–23)
CALCIUM: 9.2 mg/dL (ref 8.9–10.3)
CO2: 18 mmol/L — AB (ref 22–32)
Chloride: 110 mmol/L (ref 98–111)
Creatinine, Ser: 1.26 mg/dL — ABNORMAL HIGH (ref 0.61–1.24)
GFR calc non Af Amer: 59 mL/min — ABNORMAL LOW (ref >60)
Glucose, Bld: 123 mg/dL — ABNORMAL HIGH (ref 70–99)
Potassium: 3.7 mmol/L (ref 3.5–5.1)
SODIUM: 143 mmol/L (ref 135–145)
TOTAL PROTEIN: 7 g/dL (ref 6.5–8.1)
Total Bilirubin: 1 mg/dL (ref 0.3–1.2)

## 2018-01-05 LAB — PROTIME-INR
INR: 0.98
PROTHROMBIN TIME: 12.9 s (ref 11.4–15.2)

## 2018-01-05 LAB — I-STAT TROPONIN, ED: TROPONIN I, POC: 0.02 ng/mL (ref 0.00–0.08)

## 2018-01-05 LAB — CBC
HEMATOCRIT: 48.4 % (ref 39.0–52.0)
Hemoglobin: 16.4 g/dL (ref 13.0–17.0)
MCH: 30.8 pg (ref 26.0–34.0)
MCHC: 33.9 g/dL (ref 30.0–36.0)
MCV: 90.8 fL (ref 78.0–100.0)
Platelets: 166 10*3/uL (ref 150–400)
RBC: 5.33 MIL/uL (ref 4.22–5.81)
RDW: 12.7 % (ref 11.5–15.5)
WBC: 12.5 10*3/uL — ABNORMAL HIGH (ref 4.0–10.5)

## 2018-01-05 LAB — APTT: aPTT: 28 seconds (ref 24–36)

## 2018-01-05 LAB — ETHANOL

## 2018-01-05 MED ORDER — DILTIAZEM HCL-DEXTROSE 100-5 MG/100ML-% IV SOLN (PREMIX)
5.0000 mg/h | INTRAVENOUS | Status: DC
  Administered 2018-01-05 – 2018-01-07 (×3): 5 mg/h via INTRAVENOUS
  Filled 2018-01-05 (×3): qty 100

## 2018-01-05 MED ORDER — IOPAMIDOL (ISOVUE-370) INJECTION 76%
100.0000 mL | Freq: Once | INTRAVENOUS | Status: AC | PRN
  Administered 2018-01-05: 100 mL via INTRAVENOUS

## 2018-01-05 MED ORDER — DILTIAZEM LOAD VIA INFUSION
20.0000 mg | Freq: Once | INTRAVENOUS | Status: AC
  Administered 2018-01-05: 20 mg via INTRAVENOUS
  Filled 2018-01-05: qty 20

## 2018-01-05 MED ORDER — HEPARIN (PORCINE) IN NACL 100-0.45 UNIT/ML-% IJ SOLN
1300.0000 [IU]/h | INTRAMUSCULAR | Status: DC
  Filled 2018-01-05: qty 250

## 2018-01-05 MED ORDER — ASPIRIN 300 MG RE SUPP
600.0000 mg | Freq: Once | RECTAL | Status: AC
  Administered 2018-01-05: 600 mg via RECTAL
  Filled 2018-01-05: qty 2

## 2018-01-05 MED ORDER — HEPARIN BOLUS VIA INFUSION
4000.0000 [IU] | Freq: Once | INTRAVENOUS | Status: DC
  Filled 2018-01-05: qty 4000

## 2018-01-05 NOTE — ED Triage Notes (Signed)
Pt arrives from home via [facility], LSW 1500 (per wife laid down for nap at this time) Code stroke called en route, R sided deficits and aphasia,  New-onset (reported) AFIB per [facility]

## 2018-01-05 NOTE — ED Notes (Signed)
Patient transported to MRI 

## 2018-01-05 NOTE — ED Notes (Signed)
Per Dr Gaspar Skeeters, Q65min vitals for 2 hours, and then Q2 hour neuro checks

## 2018-01-05 NOTE — Progress Notes (Signed)
Richard [name] for Heparin Indication: atrial fibrillation  Allergies  Allergen Reactions  . Penicillins Other (See Comments)    Siblings are allergic Has patient had a PCN reaction causing immediate rash, facial/tongue/throat swelling, SOB or lightheadedness with hypotension: Unknown Has patient had a PCN reaction causing severe rash involving mucus membranes or skin necrosis: Unknown Has patient had a PCN reaction that required hospitalization: Unknown Has patient had a PCN reaction occurring within the last 10 years: Unknown If all of the above answers are "NO", then may proceed with Cephalosporin use.    Patient Measurements: Height: 6' (182.9 cm) Weight: 210 lb 5.1 oz (95.4 kg) IBW/kg (Calculated) : 77.6   Vital Signs: Temp: 98.3 F (36.8 C) (07/21 2121) Temp Source: Oral (07/21 2121) BP: 133/88 (07/21 2156) Pulse Rate: 130 (07/21 2156)  Labs: Recent Labs    01/05/18 2028 01/05/18 2036  HGB 16.4 16.3  HCT 48.4 48.0  PLT 166  --   APTT 28  --   LABPROT 12.9  --   INR 0.98  --   CREATININE 1.26* 1.10    Estimated Creatinine Clearance: 82.3 mL/min (by C-G formula based on SCr of 1.1 mg/dL).   Medical History: Past Medical History:  Diagnosis Date  . Alcoholism in remission ([place]) 08/25/2011  . Allergy    seasonal  . CAD (coronary artery disease) 08/25/2011  . Depression   . Hyperlipidemia   . Hypertension   . Sleep apnea    did use cpap about 10 years ago no use now    Assessment: 62 year old male to begin heparin for Afib  Goal of Therapy:  Heparin level 0.3-0.7 units/ml Monitor platelets by anticoagulation protocol: Yes   Plan:  Heparin 4000 units iv bolus x 1 Heparin drip at 1300 units / hr Heparin level and CBC daily  Thank you Anette Guarneri, PharmD [phone] 01/05/2018,10:01 PM

## 2018-01-05 NOTE — ED Notes (Signed)
ED Provider at bedside. 

## 2018-01-05 NOTE — Consult Note (Signed)
NEURO HOSPITALIST CONSULT NOTE   Requestig physician: Dr. Tyrone Nine  Reason for Consult: Acute onset of right sided weakness, right facial droop with expressive and receptive dysphasia  History obtained from:  EMS, Patient, Wife and Chart     HPI:                                                                                                                                          Richard [name] is a right handed 62 y.o. male presenting with acute onset of right sided weakness, right facial droop with expressive and receptive dysphasia. LKN was 1500 when wife left the house as he was doing work in the yard. When she came home he was sleeping in a bedroom with door closed. When she went to check on him at 1915, she noted the above deficits. On further interview with wife's assistance in the context of patient's fragmentary speech output, he started to feel strange while working in the yard, then went into the garage where he fell on his right side, then got up and went to the bedroom to rest. He has no memory lapse regarding the event and does not think that he had a seizure. He has no prior history of seizure or stroke. He is on a statin. Not taking a blood thinner or antiplatelet agent. However, wife gave him an ASA prior to coming to the ED. Of note, EMS saw atrial fibrillation on EKG performed in the field; patient has no prior diagnosis of such. A-fib is confirmed on EKG performed in [facility] ED. He has a history of EtOH abuse but has been sober for the past 12 years. Denies headache.   Past Medical History:  Diagnosis Date  . Alcoholism in remission ([place]) 08/25/2011  . Allergy    seasonal  . CAD (coronary artery disease) 08/25/2011  . Depression   . Hyperlipidemia   . Hypertension   . Sleep apnea    did use cpap about 10 years ago no use now    Past Surgical History:  Procedure Laterality Date  . COLONOSCOPY  10-22-11   3 polyps  . FINGER NEUROPLASTY     left index finger   . left achillies  2003  . POLYPECTOMY      Family History  Problem Relation Age of Onset  . Hypertension Mother   . Diabetes Mother   . Heart attack Brother   . Colon cancer Neg Hx   . Stomach cancer Neg Hx   . Rectal cancer Neg Hx   . Esophageal cancer Neg Hx               Social History:  reports that he has never smoked. He has never used smokeless tobacco. He reports that he does  not drink alcohol or use drugs.  Allergies  Allergen Reactions  . Penicillins Other (See Comments)    Siblings are allergic    MEDICATIONS:                                                                                                                     Current Meds  Medication Sig  . aspirin EC 325 MG tablet Take 325 mg by mouth once.  Marland Kitchen atenolol (TENORMIN) 25 MG tablet Take 1 tablet (25 mg total) by mouth 2 (two) times daily.  Marland Kitchen atorvastatin (LIPITOR) 80 MG tablet Take 1 tablet (80 mg total) by mouth daily. (Patient taking differently: Take 80 mg by mouth at bedtime. )  . diltiazem (DILT-XR) 240 MG 24 hr capsule TAKE 1 CAPSULE (240 MG TOTAL) BY MOUTH DAILY. (Patient taking differently: Take 240 mg by mouth daily. )  . montelukast (SINGULAIR) 10 MG tablet Take 1 tablet (10 mg total) by mouth daily.  Marland Kitchen triamcinolone (NASACORT AQ) 55 MCG/ACT AERO nasal inhaler Place 2 sprays into the nose daily. (Patient taking differently: Place 2 sprays into the nose daily as needed (congestion). )     ROS:                                                                                                                                       As per HPI. Has not had any recent complaints.   There were no vitals taken for this visit.  General Examination:                                                                                                       Physical Exam  HEENT-  [place]/AT    Lungs- Respirations unlabored  Abdomen- Nondistended Extremities- Warm and well perfused. Has new swelling and erythema to  right elbow c/w recent trauma from fall  Neurological Examination Mental Status: Awake and alert. Severe expressive aphasia with mild to moderate receptive aphasia. Able to follow some  commands but not others. Speech output is halting, mono and disyllabic only, with lingual and pharyngeal dysarthric quality. Able to count fingers and do simple addition, showing how much with his left hand. Cranial Nerves: II: Visual fields intact bilaterally to bedside confrontation. PERRL.  III,IV, VI: Right palpebral fissure wider than left, with subtly decreased brow furrowing on the right. Leftward gaze preference with difficulty crossing midline to right and inability to fully gaze to right. No nystagmus. Eyes are conjugate.  V,VII: Prominent right facial droop. Temp sensation subjectively equal bilaterally.  VIII: hearing intact to voice IX,X: Pharyngeal dyarthria XI: Head tends to preferentially rotate to left XII: Lingual dysarthria. Tongue protrudes midline.  Motor: RUE: Flexed at wrist and elbow, adducted at shoulder, 2/5 with inability to raise antigravity but with slight resistance.  RLE: 4/5 proximal and distal  LUE and LLE: 5/5 Sensory: Temp and light touch intact x 4 with no subjective asymmetry. No extinction.  Deep Tendon Reflexes: 2+ and symmetric with subtly more brisk responses on the right. Right toe upgoing, left downgoing.   Cerebellar: No ataxia with FNF on the left. Unable to perform on right. No ataxia with H-S on left, mild on right.  Gait: Deferred due to falls risk concerns.    Lab Results: Basic Metabolic Panel: No results for input(s): NA, K, CL, CO2, GLUCOSE, BUN, CREATININE, CALCIUM, MG, PHOS in the last 168 hours.  CBC: No results for input(s): WBC, NEUTROABS, HGB, HCT, MCV, PLT in the last 168 hours.  Cardiac Enzymes: No results for input(s): CKTOTAL, CKMB, CKMBINDEX, TROPONINI in the last 168 hours.  Lipid Panel: No results for input(s): CHOL, TRIG, HDL, CHOLHDL,  VLDL, LDLCALC in the last 168 hours.  Imaging: No results found.  Assessment: 62 year old male with acute onset of right sided weakness, aphasia and facial droop 1. Exam findings most consistently referable to the left MCA territory, involving the perisylvian cortices. However, CTA shows no LVO and CTP shows no perfusion deficit except for equivocal, possibly artifactual left parietal increased transit time on two slices. Could also be a left basal ganglia or thalamic infarction secondary to occlusion of a vessel that is too small to resolve on CTA. Alternatively, may have recanalized an MCA branch prior to CTA with residual acute MCA infarct not yet visible on CT.  2. Other DDx includes migraine accompaniment; however, he has no history of migraines. Seizure with postictal state also on DDx, but patient denies having had a seizure.  3. CAD, HLD, HTN and sleep apnea, as well as new onset a-fib.   Recommendations: 1. MRI brain. Small fragment of metal subcutaneously in anterior chest wall on right does not in my opinion present a significant risk relative to the benefit obtained diagnostically. Discussed with family. 2. EEG. 3. Rectal ASA 600 mg x 1 now. Most likely will need to be switched to anticoagulation if MRI brain confirms a stroke.  4. Q3min neuro checks x 2 hours, then Q2h.  5. TTE. 6. Permissive HTN x 24 hours.  7. Continue statin.  8. PT/OT/Speech  45 minutes spent in the emergent neurological evaluation and management of this critically ill acute stroke patient  Electronically signed: Dr. Kerney Elbe 01/05/2018, 8:32 PM

## 2018-01-05 NOTE — ED Triage Notes (Signed)
IVS 18 LAC, 20 L Hand

## 2018-01-05 NOTE — ED Provider Notes (Signed)
[facility] EMERGENCY DEPARTMENT Provider Note   CSN: [id number] Arrival date & time: 01/05/18  2026     History   Chief Complaint Chief Complaint  Patient presents with  . Code Stroke    HPI Richard [name] is a 62 y.o. male.  62 yo M arrived as a code stroke.  History limited to urgency of getting him back to imaging.  Sudden onset of facial droop.  Protecting airway and sent back to scanner.  Symptom onset about 7 hours ago.  Right-sided weakness and difficulty with speech.  The history is provided by the patient and the EMS personnel.  Illness  This is a new problem. The current episode started 1 to 2 hours ago. The problem occurs constantly. The problem has not changed since onset.Pertinent negatives include no chest pain, no abdominal pain, no headaches and no shortness of breath. Nothing aggravates the symptoms. Nothing relieves the symptoms. He has tried nothing for the symptoms. The treatment provided no relief.    Past Medical History:  Diagnosis Date  . Alcoholism in remission ([place]) 08/25/2011  . Allergy    seasonal  . CAD (coronary artery disease) 08/25/2011  . Depression   . Hyperlipidemia   . Hypertension   . Sleep apnea    did use cpap about 10 years ago no use now    Patient Active Problem List   Diagnosis Date Noted  . Cough 10/18/2016  . Wheezing 10/18/2016  . Allergic rhinitis 08/31/2014  . Recurrent low back pain 10/15/2012  . Depression 08/30/2011  . Preventative health care 08/30/2011  . CAD (coronary artery disease) 08/25/2011  . Alcoholism in remission ([place]) 08/25/2011  . Hyperlipidemia   . Hypertension   . CHEST PAIN 08/30/2010    Past Surgical History:  Procedure Laterality Date  . COLONOSCOPY  10-22-11   3 polyps  . FINGER NEUROPLASTY     left index finger  . left achillies  2003  . POLYPECTOMY          Home Medications    Prior to Admission medications   Medication Sig Start Date End Date Taking? Authorizing  Provider  aspirin EC 325 MG tablet Take 325 mg by mouth once.   Yes [provider]  atenolol (TENORMIN) 25 MG tablet Take 1 tablet (25 mg total) by mouth 2 (two) times daily. 11/01/17  Yes Biagio Borg, MD  atorvastatin (LIPITOR) 80 MG tablet Take 1 tablet (80 mg total) by mouth daily. Patient taking differently: Take 80 mg by mouth at bedtime.  11/01/17  Yes Biagio Borg, MD  diltiazem (DILT-XR) 240 MG 24 hr capsule TAKE 1 CAPSULE (240 MG TOTAL) BY MOUTH DAILY. Patient taking differently: Take 240 mg by mouth daily.  11/01/17  Yes Biagio Borg, MD  montelukast (SINGULAIR) 10 MG tablet Take 1 tablet (10 mg total) by mouth daily. 11/01/17  Yes Biagio Borg, MD  triamcinolone (NASACORT AQ) 55 MCG/ACT AERO nasal inhaler Place 2 sprays into the nose daily. Patient taking differently: Place 2 sprays into the nose daily as needed (congestion).  11/01/17  Yes Biagio Borg, MD    Family History Family History  Problem Relation Age of Onset  . Hypertension Mother   . Diabetes Mother   . Heart attack Brother   . Colon cancer Neg Hx   . Stomach cancer Neg Hx   . Rectal cancer Neg Hx   . Esophageal cancer Neg Hx     Social History  Social History   Tobacco Use  . Smoking status: Never Smoker  . Smokeless tobacco: Never Used  Substance Use Topics  . Alcohol use: No  . Drug use: No     Allergies   Penicillins   Review of Systems Review of Systems  Constitutional: Negative for chills and fever.  HENT: Negative for congestion and facial swelling.   Eyes: Negative for discharge and visual disturbance.  Respiratory: Negative for shortness of breath.   Cardiovascular: Negative for chest pain and palpitations.  Gastrointestinal: Negative for abdominal pain, diarrhea and vomiting.  Musculoskeletal: Negative for arthralgias and myalgias.  Skin: Negative for color change and rash.  Neurological: Positive for facial asymmetry, speech difficulty and weakness. Negative for tremors,  syncope and headaches.  Psychiatric/Behavioral: Negative for confusion and dysphoric mood.     Physical Exam Updated Vital Signs BP (!) 168/98   Pulse 78   Temp 98.3 F (36.8 C) (Oral)   Resp 12   Ht 6' (1.829 m)   Wt 95.4 kg (210 lb 5.1 oz)   SpO2 97%   BMI 28.52 kg/m   Physical Exam  Constitutional: He appears well-developed and well-nourished.  HENT:  Head: Normocephalic and atraumatic.  Eyes: Pupils are equal, round, and reactive to light. EOM are normal.  Neck: Normal range of motion. Neck supple. No JVD present.  Cardiovascular: Normal rate and regular rhythm. Exam reveals no gallop and no friction rub.  No murmur heard. Pulmonary/Chest: No respiratory distress. He has no wheezes.  Abdominal: He exhibits no distension. There is no rebound and no guarding.  Musculoskeletal: Normal range of motion.  Neurological: He is alert.  Aphasia, R upper extremity paralysis.    Skin: No rash noted. No pallor.  Psychiatric: He has a normal mood and affect. His behavior is normal.  Nursing note and vitals reviewed.    ED Treatments / Results  Labs (all labs ordered are listed, but only abnormal results are displayed) Labs Reviewed  CBC - Abnormal; Notable for the following components:      Result Value   WBC 12.5 (*)    All other components within normal limits  DIFFERENTIAL - Abnormal; Notable for the following components:   Neutro Abs 10.2 (*)    All other components within normal limits  COMPREHENSIVE METABOLIC PANEL - Abnormal; Notable for the following components:   CO2 18 (*)    Glucose, Bld 123 (*)    Creatinine, Ser 1.26 (*)    GFR calc non Af Amer 59 (*)    All other components within normal limits  I-STAT CHEM 8, ED - Abnormal; Notable for the following components:   Glucose, Bld 120 (*)    Calcium, Ion 1.04 (*)    TCO2 19 (*)    All other components within normal limits  ETHANOL  PROTIME-INR  APTT  RAPID URINE DRUG SCREEN, HOSP PERFORMED  URINALYSIS,  ROUTINE W REFLEX MICROSCOPIC  HEPARIN LEVEL (UNFRACTIONATED)  CBC  I-STAT TROPONIN, ED    EKG None  Radiology Ct Angio Head W Or Wo Contrast  Result Date: 01/05/2018 CLINICAL DATA:  Code stroke, slurred speech, RIGHT facial droop. EXAM: CT ANGIOGRAPHY HEAD AND NECK TECHNIQUE: Multidetector CT imaging of the head and neck was performed using the standard protocol during bolus administration of intravenous contrast. Multiplanar CT image reconstructions and MIPs were obtained to evaluate the vascular anatomy. Carotid stenosis measurements (when applicable) are obtained utilizing NASCET criteria, using the distal internal carotid diameter as the denominator. CONTRAST:  131mL ISOVUE-370 IOPAMIDOL (ISOVUE-370) INJECTION 76% COMPARISON:  CT head earlier today. FINDINGS: CTA NECK FINDINGS Aortic arch: Bovine trunk. Imaged portion shows no evidence of aneurysm or dissection. No significant stenosis of the major arch vessel origins. Right carotid system: No evidence of dissection, stenosis (50% or greater) or occlusion. Left carotid system: No evidence of dissection, stenosis (50% or greater) or occlusion. Vertebral arteries: LEFT dominant. No evidence of dissection, stenosis (50% or greater) or occlusion. Skeleton: Spondylosis.  No aggressive bone lesions. Mandibular tori. Other neck: No mass.  Airway midline. Upper chest: Unremarkable. Review of the MIP images confirms the above findings CTA HEAD FINDINGS Anterior circulation: No significant stenosis, proximal occlusion, aneurysm, or vascular malformation. Mild irregularity of the distal MCA branches suggesting intracranial atherosclerotic disease. Posterior circulation: No significant stenosis, proximal occlusion, aneurysm, or vascular malformation. Venous sinuses: As permitted by contrast timing, patent. Anatomic variants: None of significance. Delayed phase: Not performed. Review of the MIP images confirms the above findings IMPRESSION: No intracranial or  extracranial stenosis or occlusion. Mild irregularity of the distal MCA branches suggesting intracranial atherosclerotic change. These results were called by telephone at the time of interpretation on 01/05/2018 at 8:54 pm to Dr. Kerney [place] , who verbally acknowledged these results. Electronically Signed   By: Staci Righter M.D.   On: 01/05/2018 20:57   Dg Shoulder Right  Result Date: 01/05/2018 CLINICAL DATA:  Shoulder pain after fall EXAM: RIGHT SHOULDER - 2+ VIEW COMPARISON:  None. FINDINGS: Moderate AC joint degenerative changes. No acute displaced fracture or malalignment. IMPRESSION: No acute osseous abnormality Electronically Signed   By: Donavan Foil M.D.   On: 01/05/2018 23:10   Ct Angio Neck W Or Wo Contrast  Result Date: 01/05/2018 CLINICAL DATA:  Code stroke, slurred speech, RIGHT facial droop. EXAM: CT ANGIOGRAPHY HEAD AND NECK TECHNIQUE: Multidetector CT imaging of the head and neck was performed using the standard protocol during bolus administration of intravenous contrast. Multiplanar CT image reconstructions and MIPs were obtained to evaluate the vascular anatomy. Carotid stenosis measurements (when applicable) are obtained utilizing NASCET criteria, using the distal internal carotid diameter as the denominator. CONTRAST:  150mL ISOVUE-370 IOPAMIDOL (ISOVUE-370) INJECTION 76% COMPARISON:  CT head earlier today. FINDINGS: CTA NECK FINDINGS Aortic arch: Bovine trunk. Imaged portion shows no evidence of aneurysm or dissection. No significant stenosis of the major arch vessel origins. Right carotid system: No evidence of dissection, stenosis (50% or greater) or occlusion. Left carotid system: No evidence of dissection, stenosis (50% or greater) or occlusion. Vertebral arteries: LEFT dominant. No evidence of dissection, stenosis (50% or greater) or occlusion. Skeleton: Spondylosis.  No aggressive bone lesions. Mandibular tori. Other neck: No mass.  Airway midline. Upper chest: Unremarkable.  Review of the MIP images confirms the above findings CTA HEAD FINDINGS Anterior circulation: No significant stenosis, proximal occlusion, aneurysm, or vascular malformation. Mild irregularity of the distal MCA branches suggesting intracranial atherosclerotic disease. Posterior circulation: No significant stenosis, proximal occlusion, aneurysm, or vascular malformation. Venous sinuses: As permitted by contrast timing, patent. Anatomic variants: None of significance. Delayed phase: Not performed. Review of the MIP images confirms the above findings IMPRESSION: No intracranial or extracranial stenosis or occlusion. Mild irregularity of the distal MCA branches suggesting intracranial atherosclerotic change. These results were called by telephone at the time of interpretation on 01/05/2018 at 8:54 pm to Dr. Kerney [place] , who verbally acknowledged these results. Electronically Signed   By: Staci Righter M.D.   On: 01/05/2018 20:57   Ct Cerebral  Perfusion W Contrast  Result Date: 01/05/2018 CLINICAL DATA:  Suspected LEFT MCA syndrome. EXAM: CT PERFUSION BRAIN TECHNIQUE: Multiphase CT imaging of the brain was performed following IV bolus contrast injection. Subsequent parametric perfusion maps were calculated using RAPID software. CONTRAST:  173mL ISOVUE-370 IOPAMIDOL (ISOVUE-370) INJECTION 76% COMPARISON:  CTA head neck performed earlier. FINDINGS: CT Brain Perfusion Findings: CBF (<30%) Volume: 46mL Perfusion (Tmax>6.0s) volume: 31mL Mismatch Volume: 58mL ASPECTS on noncontrast CT Head: 10 at 8:40 p.m. today. Infarct Core: 0 mL Infarction Location:Not applicable. There is a small wedge defect, 15 mL volume, T-max greater than 4 seconds, LEFT parietal cortex which does not correlate with the clinical picture and nor with a definite vascular occlusion. This was discussed with the ordering provider, and it is felt there were no findings on the CTA to explain this appearance. IMPRESSION: CT brain perfusion is negative.  See  discussion above. Electronically Signed   By: Staci Righter M.D.   On: 01/05/2018 21:21   Dg Chest Port 1 View  Result Date: 01/05/2018 CLINICAL DATA:  History of fall to the right side EXAM: PORTABLE CHEST 1 VIEW COMPARISON:  10/18/2016, CT 09/21/2010 FINDINGS: Mild elevation of the right diaphragm. No acute airspace disease or effusion. Normal heart size. No pneumothorax. 8 mm metallic density projecting over the right ninth rib medially, on limited cardiac CT from 09/21/2010, this appears to be within the anterior chest wall musculature. IMPRESSION: No active disease. 8 mm metallic opacity over the right lower chest wall. Electronically Signed   By: Donavan Foil M.D.   On: 01/05/2018 23:07   Dg Hand Complete Right  Result Date: 01/05/2018 CLINICAL DATA:  Right hand pain history of fall EXAM: RIGHT HAND - COMPLETE 3+ VIEW COMPARISON:  None. FINDINGS: No acute displaced fracture or malalignment. Moderate arthritis at the second MCP joint. Minimal degenerative changes at the third and fifth MCP joints. No radiopaque foreign body. IMPRESSION: No acute osseous abnormality Electronically Signed   By: Donavan Foil M.D.   On: 01/05/2018 23:09   Ct Head Code Stroke Wo Contrast  Result Date: 01/05/2018 CLINICAL DATA:  Code stroke. Last seen normal at 1500 hours, RIGHT facial droop with slurred speech. EXAM: CT HEAD WITHOUT CONTRAST TECHNIQUE: Contiguous axial images were obtained from the base of the skull through the vertex without intravenous contrast. COMPARISON:  None. FINDINGS: Brain: No evidence of acute infarction, hemorrhage, hydrocephalus, extra-axial collection or mass lesion/mass effect. Normal for age cerebral volume. No white matter disease. Vascular: No hyperdense vessel or unexpected calcification. Skull: Normal. Negative for fracture or focal lesion. Sinuses/Orbits: No acute finding. Other: None. ASPECTS [facility] Stroke Program Early CT Score) - Ganglionic level infarction (caudate, lentiform  nuclei, internal capsule, insula, M1-M3 cortex): 7 - Supraganglionic infarction (M4-M6 cortex): 3 Total score (0-10 with 10 being normal): 10 IMPRESSION: 1. Negative exam 2. ASPECTS is 10. These results were communicated to Dr. Cheral Marker at 8:40 [place] 7/21/2019by text page via the [facility] messaging system. Electronically Signed   By: Staci Righter M.D.   On: 01/05/2018 20:42    Procedures Procedures (including critical care time)  Medications Ordered in ED Medications  diltiazem (CARDIZEM) 1 mg/mL load via infusion 20 mg (20 mg Intravenous Bolus from Bag 01/05/18 2230)    And  diltiazem (CARDIZEM) 100 mg in dextrose 5% 148mL (1 mg/mL) infusion (0 mg/hr Intravenous Stopped 01/05/18 2250)  iopamidol (ISOVUE-370) 76 % injection 100 mL (100 mLs Intravenous Contrast Given 01/05/18 2038)  aspirin suppository 600 mg (600 mg  Rectal Given 01/05/18 2132)     Initial Impression / Assessment and Plan / ED Course  I have reviewed the triage vital signs and the nursing notes.  Pertinent labs & imaging results that were available during my care of the patient were reviewed by me and considered in my medical decision making (see chart for details).     62 yo M with a cc of unilateral weakness, arrived as code stroke, taken urgently back to scanner after airway cleared at bridge.   Patient was not given TPA.  No large vessel occlusion noted on CT angiogram.  Plan is for MRI and hospitalist admit.  Patient was noted to be in new onset atrial fibrillation with RVR.  Given a bolus of diltiazem with rate control.  CRITICAL CARE Performed by: Cecilio Asper   Total critical care time: 35 minutes  Critical care time was exclusive of separately billable procedures and treating other patients.  Critical care was necessary to treat or prevent imminent or life-threatening deterioration.  Critical care was time spent personally by me on the following activities: development of treatment plan with patient  and/or surrogate as well as nursing, discussions with consultants, evaluation of patient's response to treatment, examination of patient, obtaining history from patient or surrogate, ordering and performing treatments and interventions, ordering and review of laboratory studies, ordering and review of radiographic studies, pulse oximetry and re-evaluation of patient's condition.  The patients results and plan were reviewed and discussed.   Any x-rays performed were independently reviewed by myself.   Differential diagnosis were considered with the presenting HPI.  Medications  diltiazem (CARDIZEM) 1 mg/mL load via infusion 20 mg (20 mg Intravenous Bolus from Bag 01/05/18 2230)    And  diltiazem (CARDIZEM) 100 mg in dextrose 5% 187mL (1 mg/mL) infusion (0 mg/hr Intravenous Stopped 01/05/18 2250)  iopamidol (ISOVUE-370) 76 % injection 100 mL (100 mLs Intravenous Contrast Given 01/05/18 2038)  aspirin suppository 600 mg (600 mg Rectal Given 01/05/18 2132)    Vitals:   01/05/18 2201 01/05/18 2215 01/05/18 2230 01/05/18 2245  BP: (!) 151/98 (!) 158/103 (!) 148/92 (!) 168/98  Pulse: 81 74 71 78  Resp: 20 17 16 12   Temp:      TempSrc:      SpO2: 99% 99% 98% 97%  Weight:      Height:        Final diagnoses:  Acute ischemic stroke ([place])    Admission/ observation were discussed with the admitting physician, patient and/or family and they are comfortable with the plan.   Final Clinical Impressions(s) / ED Diagnoses   Final diagnoses:  Acute ischemic stroke [facility])    ED Discharge Orders    None       Deno Etienne, DO 01/05/18 2320

## 2018-01-06 ENCOUNTER — Encounter (HOSPITAL_COMMUNITY): Payer: Self-pay | Admitting: Cardiology

## 2018-01-06 ENCOUNTER — Inpatient Hospital Stay (HOSPITAL_COMMUNITY): Payer: Federal, State, Local not specified - PPO

## 2018-01-06 DIAGNOSIS — E663 Overweight: Secondary | ICD-10-CM | POA: Diagnosis present

## 2018-01-06 DIAGNOSIS — R4701 Aphasia: Secondary | ICD-10-CM | POA: Diagnosis present

## 2018-01-06 DIAGNOSIS — I4891 Unspecified atrial fibrillation: Secondary | ICD-10-CM | POA: Diagnosis not present

## 2018-01-06 DIAGNOSIS — I639 Cerebral infarction, unspecified: Secondary | ICD-10-CM | POA: Diagnosis not present

## 2018-01-06 DIAGNOSIS — I63412 Cerebral infarction due to embolism of left middle cerebral artery: Secondary | ICD-10-CM | POA: Diagnosis not present

## 2018-01-06 DIAGNOSIS — R4702 Dysphasia: Secondary | ICD-10-CM | POA: Diagnosis present

## 2018-01-06 DIAGNOSIS — I110 Hypertensive heart disease with heart failure: Secondary | ICD-10-CM | POA: Diagnosis present

## 2018-01-06 DIAGNOSIS — E785 Hyperlipidemia, unspecified: Secondary | ICD-10-CM | POA: Diagnosis present

## 2018-01-06 DIAGNOSIS — I6340 Cerebral infarction due to embolism of unspecified cerebral artery: Secondary | ICD-10-CM | POA: Diagnosis present

## 2018-01-06 DIAGNOSIS — M549 Dorsalgia, unspecified: Secondary | ICD-10-CM | POA: Diagnosis present

## 2018-01-06 DIAGNOSIS — I351 Nonrheumatic aortic (valve) insufficiency: Secondary | ICD-10-CM

## 2018-01-06 DIAGNOSIS — I429 Cardiomyopathy, unspecified: Secondary | ICD-10-CM | POA: Diagnosis present

## 2018-01-06 DIAGNOSIS — Z8249 Family history of ischemic heart disease and other diseases of the circulatory system: Secondary | ICD-10-CM | POA: Diagnosis not present

## 2018-01-06 DIAGNOSIS — Z7982 Long term (current) use of aspirin: Secondary | ICD-10-CM | POA: Diagnosis not present

## 2018-01-06 DIAGNOSIS — Z6828 Body mass index (BMI) 28.0-28.9, adult: Secondary | ICD-10-CM | POA: Diagnosis not present

## 2018-01-06 DIAGNOSIS — G4733 Obstructive sleep apnea (adult) (pediatric): Secondary | ICD-10-CM | POA: Diagnosis present

## 2018-01-06 DIAGNOSIS — G8191 Hemiplegia, unspecified affecting right dominant side: Secondary | ICD-10-CM | POA: Diagnosis present

## 2018-01-06 DIAGNOSIS — I6300 Cerebral infarction due to thrombosis of unspecified precerebral artery: Secondary | ICD-10-CM

## 2018-01-06 DIAGNOSIS — E7800 Pure hypercholesterolemia, unspecified: Secondary | ICD-10-CM | POA: Diagnosis not present

## 2018-01-06 DIAGNOSIS — G8929 Other chronic pain: Secondary | ICD-10-CM | POA: Diagnosis present

## 2018-01-06 DIAGNOSIS — Z833 Family history of diabetes mellitus: Secondary | ICD-10-CM | POA: Diagnosis not present

## 2018-01-06 DIAGNOSIS — R29713 NIHSS score 13: Secondary | ICD-10-CM | POA: Diagnosis present

## 2018-01-06 DIAGNOSIS — M25421 Effusion, right elbow: Secondary | ICD-10-CM | POA: Diagnosis present

## 2018-01-06 DIAGNOSIS — Z79899 Other long term (current) drug therapy: Secondary | ICD-10-CM | POA: Diagnosis not present

## 2018-01-06 DIAGNOSIS — W19XXXA Unspecified fall, initial encounter: Secondary | ICD-10-CM | POA: Diagnosis present

## 2018-01-06 DIAGNOSIS — I480 Paroxysmal atrial fibrillation: Secondary | ICD-10-CM | POA: Diagnosis present

## 2018-01-06 DIAGNOSIS — R29810 Facial weakness: Secondary | ICD-10-CM | POA: Diagnosis present

## 2018-01-06 DIAGNOSIS — I5021 Acute systolic (congestive) heart failure: Secondary | ICD-10-CM | POA: Diagnosis present

## 2018-01-06 DIAGNOSIS — Z880 Allergy status to penicillin: Secondary | ICD-10-CM | POA: Diagnosis not present

## 2018-01-06 DIAGNOSIS — I2510 Atherosclerotic heart disease of native coronary artery without angina pectoris: Secondary | ICD-10-CM | POA: Diagnosis present

## 2018-01-06 DIAGNOSIS — R471 Dysarthria and anarthria: Secondary | ICD-10-CM | POA: Diagnosis present

## 2018-01-06 LAB — URINALYSIS, ROUTINE W REFLEX MICROSCOPIC
Bilirubin Urine: NEGATIVE
GLUCOSE, UA: NEGATIVE mg/dL
HGB URINE DIPSTICK: NEGATIVE
Ketones, ur: NEGATIVE mg/dL
Leukocytes, UA: NEGATIVE
Nitrite: NEGATIVE
Protein, ur: NEGATIVE mg/dL
Specific Gravity, Urine: >1.046 — ABNORMAL HIGH (ref 1.005–1.030)
pH: 6 (ref 5.0–8.0)

## 2018-01-06 LAB — RAPID URINE DRUG SCREEN, HOSP PERFORMED
Amphetamines: NOT DETECTED
Barbiturates: NOT DETECTED
Benzodiazepines: NOT DETECTED
Cocaine: NOT DETECTED
Opiates: NOT DETECTED
Tetrahydrocannabinol: NOT DETECTED

## 2018-01-06 LAB — GLUCOSE, CAPILLARY: Glucose-Capillary: 118 mg/dL — ABNORMAL HIGH (ref 70–99)

## 2018-01-06 LAB — LIPID PANEL
Cholesterol: 250 mg/dL — ABNORMAL HIGH (ref 0–200)
HDL: 41 mg/dL (ref >40)
LDL CALC: 192 mg/dL — AB (ref 0–99)
Total CHOL/HDL Ratio: 6.1 RATIO
Triglycerides: 86 mg/dL (ref <150)
VLDL: 17 mg/dL (ref 0–40)

## 2018-01-06 LAB — ECHOCARDIOGRAM COMPLETE
Height: 72 in
Weight: 3365.1 oz

## 2018-01-06 LAB — HIV ANTIBODY (ROUTINE TESTING W REFLEX): HIV SCREEN 4TH GENERATION: NONREACTIVE

## 2018-01-06 LAB — TSH: TSH: 1.077 u[IU]/mL (ref 0.350–4.500)

## 2018-01-06 LAB — HEMOGLOBIN A1C
HEMOGLOBIN A1C: 5.5 % (ref 4.8–5.6)
Mean Plasma Glucose: 111.15 mg/dL

## 2018-01-06 MED ORDER — APIXABAN 5 MG PO TABS
5.0000 mg | ORAL_TABLET | Freq: Two times a day (BID) | ORAL | Status: DC
  Administered 2018-01-06 – 2018-01-07 (×3): 5 mg via ORAL
  Filled 2018-01-06 (×3): qty 1

## 2018-01-06 MED ORDER — ENOXAPARIN SODIUM 40 MG/0.4ML ~~LOC~~ SOLN: 40.0000 mg | SUBCUTANEOUS | Status: DC

## 2018-01-06 MED ORDER — STROKE: EARLY STAGES OF RECOVERY BOOK
Freq: Once | Status: DC
  Filled 2018-01-06: qty 1

## 2018-01-06 MED ORDER — ASPIRIN 300 MG RE SUPP: 300.0000 mg | Freq: Every day | RECTAL | Status: DC

## 2018-01-06 MED ORDER — ACETAMINOPHEN 325 MG PO TABS: 650.0000 mg | ORAL_TABLET | ORAL | Status: DC | PRN

## 2018-01-06 MED ORDER — ACETAMINOPHEN 160 MG/5ML PO SOLN: 650.0000 mg | ORAL | Status: DC | PRN

## 2018-01-06 MED ORDER — ASPIRIN 325 MG PO TABS
325.0000 mg | ORAL_TABLET | Freq: Every day | ORAL | Status: DC
  Administered 2018-01-06 – 2018-01-07 (×2): 325 mg via ORAL
  Filled 2018-01-06 (×2): qty 1

## 2018-01-06 MED ORDER — SODIUM CHLORIDE 0.9 % IV SOLN
INTRAVENOUS | Status: DC
  Administered 2018-01-06 – 2018-01-07 (×3): via INTRAVENOUS

## 2018-01-06 MED ORDER — ENOXAPARIN SODIUM 100 MG/ML ~~LOC~~ SOLN
95.0000 mg | Freq: Two times a day (BID) | SUBCUTANEOUS | Status: DC
  Administered 2018-01-06: 95 mg via SUBCUTANEOUS
  Filled 2018-01-06 (×2): qty 0.95

## 2018-01-06 MED ORDER — ACETAMINOPHEN 650 MG RE SUPP: 650.0000 mg | RECTAL | Status: DC | PRN

## 2018-01-06 MED ORDER — ATORVASTATIN CALCIUM 80 MG PO TABS
80.0000 mg | ORAL_TABLET | Freq: Every day | ORAL | Status: DC
  Administered 2018-01-06: 80 mg via ORAL
  Filled 2018-01-06: qty 1

## 2018-01-06 NOTE — H&P (Addendum)
History and Physical   Richard [name] TJQ:[id number] DOB: 18-Mar-1956 DOA: 01/05/2018  PCP: Biagio Borg, MD  Chief Complaint: difficulty speaking  HPI:  This is a 62 year old man with medical problems including prior alcohol abuse in remission 10 years, hypertension, hyperlipidemia, reported history of sleep apnea presenting with neurologic deficits including difficulty speaking and right upper extremity weakness. Due to patient's expressive aphasia history is obtained through spouse and medical record available.  Report last seen normal about 2:30 PM on 01/05/2018, spouse went to run errands and left the patient was working in his garden. Upon her arrival home of 4:45 PM the patient's daughter was closed, it was assumed that he is taking a nap. At approximately 7:30 PM patient was discovered to have difficulty speaking as well as right upper extremity weakness which prompted EMS call.  Although the patient is on diltiazem, spouse reports this was started when he was having hypertension in the setting of alcohol withdrawal. Since then he has continued on the calcium channel blocker, no history of atrophic fibrillation. Denies any history of stroke or heart attack. At baseline the patient does not have any deficits neurologically. 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 Report mild chronic back pain intermittent.  In transport as noted that he was in A. Fib RVR.  ED Course: vital signs remarkable for heart rate to 762, systolic blood pressure over 120 mm Hg. EKG with atrial fibrillation with rapid ventricular rate. CT scan of the head did not reveal acute intracranial abnormality.  CT angiogram did not reveal any intracranial or extra cranial stenosis or occlusion, mild irregularity of the distal MCA branches suggesting intracranial atherosclerotic change.  There is some reticence on performing MRI given  some retained metal in his chest, however after discussion with neurology team MRI brain was performed which revealed small volume acute ischemic cortical infarct involving the left parietal temporal region, no hemorrhage transformation or mass effect noted.  Patient was started on diltiazem gtt, neurology was consulted, possible medicine consulted for further management.  Review of Systems: A complete ROS was obtained; pertinent positives negatives are denoted in the HPI. Otherwise, all systems are negative.   Past Medical History:  Diagnosis Date  . Alcoholism in remission ([place]) 08/25/2011  . Allergy    seasonal  . CAD (coronary artery disease) 08/25/2011  . Depression   . Hyperlipidemia   . Hypertension   . Sleep apnea    did use cpap about 10 years ago no use now   Social History   Socioeconomic History  . Marital status: Married    Spouse name: Not on file  . Number of children: 2  . Years of education: Not on file  . Highest education level: Not on file  Occupational History  . Occupation: Teacher, adult education    Comment: [employer]  . Financial resource strain: Not on file  . Food insecurity:    Worry: Not on file    Inability: Not on file  . Transportation needs:    Medical: Not on file    Non-medical: Not on file  Tobacco Use  . Smoking status: Never Smoker  . Smokeless tobacco: Never Used  Substance and Sexual Activity  . Alcohol use: No  . Drug use: No  . Sexual activity: Not on file  Lifestyle  . Physical activity:    Days per week: Not on file  Minutes per session: Not on file  . Stress: Not on file  Relationships  . Social connections:    Talks on phone: Not on file    Gets together: Not on file    Attends religious service: Not on file    Active member of club or organization: Not on file    Attends meetings of clubs or organizations: Not on file    Relationship status: Not on file  . Intimate partner violence:    Fear of current or ex partner:  Not on file    Emotionally abused: Not on file    Physically abused: Not on file    Forced sexual activity: Not on file  Other Topics Concern  . Not on file  Social History Narrative  . Not on file   Family History  Problem Relation Age of Onset  . Hypertension Mother   . Diabetes Mother   . Heart attack Brother   . Colon cancer Neg Hx   . Stomach cancer Neg Hx   . Rectal cancer Neg Hx   . Esophageal cancer Neg Hx     Physical Exam: Vitals:   01/05/18 2215 01/05/18 2230 01/05/18 2245 01/05/18 2340  BP: (!) 158/103 (!) 148/92 (!) 168/98 128/90  Pulse: 74 71 78 97  Resp: 17 16 12    Temp:      TempSrc:      SpO2: 99% 98% 97% 99%  Weight:      Height:       General: Appears calm and comfortable. Overweight white man.  Is alert. ENT: Grossly normal hearing, MMM. Cardiovascular: Tachycardic and irregular, no murmurs, no LE edema, no bruit b/l appreciated Respiratory: CTA bilaterally. No wheezes or crackles. Normal respiratory effort. Abdomen: Soft, non-tender. No rebound or guarding. Skin: No rash or induration seen on limited exam. Musculoskeletal: No frank joint deformity Psychiatric: Grossly normal mood and affect. Neurologic: Alert. Expressive aphasia present; however, is able to report year as "51," not able to repeat "no ifs, ands, or buts" - right upper extremity weak - grade 1-2/5.  Heel to shin able to be performed slowly bilaterally.  No facial droop.  Pupils b/l round and symmetric.  Follows simple commands.  I have personally reviewed the following labs, culture data, and imaging studies.  Assessment/Plan:  #Left parietotemporal stroke in setting of atrial fibrillation with RVR, new As manifested by expressive aphasia and RUE weakness. AF RVR present on admission - likely trigger for CVA.   Plan: PT/OT/ST to optimize recovery from deficits.  EEG per neurology recommendations. Neuro checks. Will continue ASA and statin.  Risk stratification labs (A1c, lipid  profile). Checking TSH to evaluate for possible etiology for AF. Will pursue rate control for now for AF with diltiazem gtt; once PO route is established for medication, can consider transition to PO CCB or PO BB therapy.  Optimal timing for initiation of AC for secondary stroke prevention will be determined by neurology team.  TTE to evaluate cardiac function. Telemetry to continue to monitor. Pending clinical course, can consider cardiology consultation for consideration for cardioversion / AF optimization. Hydrating with NS at 75 hours x 8 hours while NPO.  #Other problems: -Hypertension: holding home atenolol while on dilt gtt, permissive HTN for now -OSA, hx of: has not used CPAP in > 10 years, reportedly symptoms improved with ETOH cessation, consider repeat sleep study  DVT prophylaxis: Subq Lovenox Code Status: Full code after discussion on admission Disposition Plan: Anticipate D/C home in 2-5  days Consults called: Neurology Admission status: admit to step-down unit for dilt gtt monitoring requirement.   Cheri Rous, MD [facility] [phone]  If 7PM-7AM, please contact night-coverage [email or web address] Password [email or web address]   Addendum: Discussed MRI findings with neurology team who believes benefits of AC outweigh potential burdens at this time; therefore, will start on therapeutic LMWH for now with consideration for transitioning to oral anticoagulation as long as he tolerates.

## 2018-01-06 NOTE — Progress Notes (Signed)
PROGRESS NOTE    Richard [name]  ZOX:[id number] DOB: 1956-06-04 DOA: 01/05/2018 PCP: Biagio Borg, MD   Brief Narrative: Patient is a 62 year old man with past medical history  hypertension, hyperlipidemia, sleep apnea who presents to the emergency department with dysarthria and right upper extremity weakness.He was also found to be in A. fib with RVR on presentation.MRI brain was performed which revealed small volume acute ischemic cortical infarct involving the left parietal temporal region, no hemorrhage transformation or mass effect noted.  Neurology was consulted.  Cardiology also consulted today for new onset A. fib with RVR.  Assessment & Plan:   Principal Problem:   Stroke [facility]) Active Problems:   Hyperlipidemia   Hypertension   New onset a-fib ([place])  Left parietotemporal stroke : Most likely embolic.  Presented with expressive aphasia and right upper extremity weakness.  Was found to be in A. fib with RVR on presentation.  PT/OT/speech therapy evaluation pending.  Neurology following.  On aspirin and statin. Hemoglobin A1c 5.5.  Lipid panel showed LDL of 192. MRI finding as above.  CTA head and neck showed no intracranial or extracranial stenosis or occlusion. Echo pending.  A. fib with RVR: New onset.  No history of A. fib in the past.  Started on Cardizem drip.  Currently heart rate is controlled.  Cardiology consulted.  Started on anticoagulation with Eliquis.  Hypertension: Hold home antihypertensive for allowing permissive hypertension.  We will continue to monitor his blood pressure.  History of OSA: Not on CPAP.   DVT prophylaxis: Eliquis Code Status: Full Family Communication: Wife present at the bedside Disposition Plan: Likely Home after full work-up.  Pending PT evaluation   Consultants: Neurology, cardiology  Procedures: None  Antimicrobials: None  Subjective: Patient seen and examined the bedside this morning.  He still has word finding difficulty.  He  has severe weakness on the right upper extremity.Heart rate is well controlled  Objective: Vitals:   01/06/18 0745 01/06/18 0800 01/06/18 0827 01/06/18 1204  BP: 138/79 (!) 128/99 (!) 127/104 (!) 130/104  Pulse: 91 79 72 67  Resp: (!) 21 14 18 20   Temp:   98.7 F (37.1 C) 98.3 F (36.8 C)  TempSrc:   Oral Oral  SpO2: 96% 99% 99% 99%  Weight:      Height:        Intake/Output Summary (Last 24 hours) at 01/06/2018 1316 Last data filed at 01/06/2018 1117 Gross per 24 hour  Intake 240 ml  Output 745 ml  Net -505 ml   Filed Weights   01/05/18 2118  Weight: 95.4 kg (210 lb 5.1 oz)    Examination:  General exam: Appears calm and comfortable ,Not in distress,average built HEENT:PERRL,Oral mucosa moist, Ear/Nose normal on gross exam Respiratory system: Bilateral equal air entry, normal vesicular breath sounds, no wheezes or crackles  Cardiovascular system: Afib. No JVD, murmurs, rubs, gallops or clicks. No pedal edema. Gastrointestinal system: Abdomen is nondistended, soft and nontender. No organomegaly or masses felt. Normal bowel sounds heard. Central nervous system: Alert and oriented.  Right upper extremity weakness.  Diminished fine movement on RUE.  Word finding difficulty. Extremities: No edema, no clubbing ,no cyanosis, distal peripheral pulses palpable. Skin: No rashes, lesions or ulcers,no icterus ,no pallor MSK: Normal muscle bulk,tone ,power Psychiatry: Judgement and insight appear normal. Mood & affect appropriate.     Data Reviewed: I have personally reviewed following labs and imaging studies  CBC: Recent Labs  Lab 01/05/18 2028 01/05/18 2036  WBC  12.5*  --   NEUTROABS 10.2*  --   HGB 16.4 16.3  HCT 48.4 48.0  MCV 90.8  --   PLT 166  --    Basic Metabolic Panel: Recent Labs  Lab 01/05/18 2028 01/05/18 2036  NA 143 142  K 3.7 3.6  CL 110 110  CO2 18*  --   GLUCOSE 123* 120*  BUN 15 17  CREATININE 1.26* 1.10  CALCIUM 9.2  --    GFR: Estimated  Creatinine Clearance: 82.3 mL/min (by C-G formula based on SCr of 1.1 mg/dL). Liver Function Tests: Recent Labs  Lab 01/05/18 2028  AST 31  ALT 31  ALKPHOS 73  BILITOT 1.0  PROT 7.0  ALBUMIN 4.1   No results for input(s): LIPASE, AMYLASE in the last 168 hours. No results for input(s): AMMONIA in the last 168 hours. Coagulation Profile: Recent Labs  Lab 01/05/18 2028  INR 0.98   Cardiac Enzymes: No results for input(s): CKTOTAL, CKMB, CKMBINDEX, TROPONINI in the last 168 hours. BNP (last 3 results) No results for input(s): PROBNP in the last 8760 hours. HbA1C: Recent Labs    01/06/18 0434  HGBA1C 5.5   CBG: Recent Labs  Lab 01/05/18 2025  GLUCAP 118*   Lipid Profile: Recent Labs    01/06/18 0438  CHOL 250*  HDL 41  LDLCALC 192*  TRIG 86  CHOLHDL 6.1   Thyroid Function Tests: Recent Labs    01/06/18 0434  TSH 1.077   Anemia Panel: No results for input(s): VITAMINB12, FOLATE, FERRITIN, TIBC, IRON, RETICCTPCT in the last 72 hours. Sepsis Labs: No results for input(s): PROCALCITON, LATICACIDVEN in the last 168 hours.  No results found for this or any previous visit (from the past 240 hour(s)).       Radiology Studies: Ct Angio Head W Or Wo Contrast  Result Date: 01/05/2018 CLINICAL DATA:  Code stroke, slurred speech, RIGHT facial droop. EXAM: CT ANGIOGRAPHY HEAD AND NECK TECHNIQUE: Multidetector CT imaging of the head and neck was performed using the standard protocol during bolus administration of intravenous contrast. Multiplanar CT image reconstructions and MIPs were obtained to evaluate the vascular anatomy. Carotid stenosis measurements (when applicable) are obtained utilizing NASCET criteria, using the distal internal carotid diameter as the denominator. CONTRAST:  130mL ISOVUE-370 IOPAMIDOL (ISOVUE-370) INJECTION 76% COMPARISON:  CT head earlier today. FINDINGS: CTA NECK FINDINGS Aortic arch: Bovine trunk. Imaged portion shows no evidence of  aneurysm or dissection. No significant stenosis of the major arch vessel origins. Right carotid system: No evidence of dissection, stenosis (50% or greater) or occlusion. Left carotid system: No evidence of dissection, stenosis (50% or greater) or occlusion. Vertebral arteries: LEFT dominant. No evidence of dissection, stenosis (50% or greater) or occlusion. Skeleton: Spondylosis.  No aggressive bone lesions. Mandibular tori. Other neck: No mass.  Airway midline. Upper chest: Unremarkable. Review of the MIP images confirms the above findings CTA HEAD FINDINGS Anterior circulation: No significant stenosis, proximal occlusion, aneurysm, or vascular malformation. Mild irregularity of the distal MCA branches suggesting intracranial atherosclerotic disease. Posterior circulation: No significant stenosis, proximal occlusion, aneurysm, or vascular malformation. Venous sinuses: As permitted by contrast timing, patent. Anatomic variants: None of significance. Delayed phase: Not performed. Review of the MIP images confirms the above findings IMPRESSION: No intracranial or extracranial stenosis or occlusion. Mild irregularity of the distal MCA branches suggesting intracranial atherosclerotic change. These results were called by telephone at the time of interpretation on 01/05/2018 at 8:54 pm to Dr. Kerney Elbe , who  verbally acknowledged these results. Electronically Signed   By: Staci Righter M.D.   On: 01/05/2018 20:57   Dg Shoulder Right  Result Date: 01/05/2018 CLINICAL DATA:  Shoulder pain after fall EXAM: RIGHT SHOULDER - 2+ VIEW COMPARISON:  None. FINDINGS: Moderate AC joint degenerative changes. No acute displaced fracture or malalignment. IMPRESSION: No acute osseous abnormality Electronically Signed   By: Donavan Foil M.D.   On: 01/05/2018 23:10   Ct Angio Neck W Or Wo Contrast  Result Date: 01/05/2018 CLINICAL DATA:  Code stroke, slurred speech, RIGHT facial droop. EXAM: CT ANGIOGRAPHY HEAD AND NECK  TECHNIQUE: Multidetector CT imaging of the head and neck was performed using the standard protocol during bolus administration of intravenous contrast. Multiplanar CT image reconstructions and MIPs were obtained to evaluate the vascular anatomy. Carotid stenosis measurements (when applicable) are obtained utilizing NASCET criteria, using the distal internal carotid diameter as the denominator. CONTRAST:  173mL ISOVUE-370 IOPAMIDOL (ISOVUE-370) INJECTION 76% COMPARISON:  CT head earlier today. FINDINGS: CTA NECK FINDINGS Aortic arch: Bovine trunk. Imaged portion shows no evidence of aneurysm or dissection. No significant stenosis of the major arch vessel origins. Right carotid system: No evidence of dissection, stenosis (50% or greater) or occlusion. Left carotid system: No evidence of dissection, stenosis (50% or greater) or occlusion. Vertebral arteries: LEFT dominant. No evidence of dissection, stenosis (50% or greater) or occlusion. Skeleton: Spondylosis.  No aggressive bone lesions. Mandibular tori. Other neck: No mass.  Airway midline. Upper chest: Unremarkable. Review of the MIP images confirms the above findings CTA HEAD FINDINGS Anterior circulation: No significant stenosis, proximal occlusion, aneurysm, or vascular malformation. Mild irregularity of the distal MCA branches suggesting intracranial atherosclerotic disease. Posterior circulation: No significant stenosis, proximal occlusion, aneurysm, or vascular malformation. Venous sinuses: As permitted by contrast timing, patent. Anatomic variants: None of significance. Delayed phase: Not performed. Review of the MIP images confirms the above findings IMPRESSION: No intracranial or extracranial stenosis or occlusion. Mild irregularity of the distal MCA branches suggesting intracranial atherosclerotic change. These results were called by telephone at the time of interpretation on 01/05/2018 at 8:54 pm to Dr. Kerney Elbe , who verbally acknowledged these  results. Electronically Signed   By: Staci Righter M.D.   On: 01/05/2018 20:57   Mr Brain Wo Contrast  Result Date: 01/06/2018 CLINICAL DATA:  Initial evaluation for focal neural deficit. EXAM: MRI HEAD WITHOUT CONTRAST TECHNIQUE: Multiplanar, multiecho pulse sequences of the brain and surrounding structures were obtained without intravenous contrast. COMPARISON:  Prior study from earlier the same day. FINDINGS: Brain: Cerebral volume within normal limits. No significant cerebral white matter changes. Patchy small volume foci of diffusion abnormality seen involving the cortical gray matter of the left temporal and parietal region (series 5, image 57, 63, 67), consistent with acute ischemic infarcts. Single small focus of susceptibility artifact at the parietal region suggestive of petechial hemorrhage (series 12, image 640). No frank hemorrhagic transformation. No mass effect. No other evidence for acute or subacute ischemia. Gray-white matter differentiation otherwise maintained. No other areas of chronic infarction. No other evidence for acute or chronic intracranial hemorrhage. No mass lesion, midline shift or mass effect. No hydrocephalus. No extra-axial fluid collection. Pituitary gland normal. Vascular: Major intracranial vascular flow voids maintained Skull and upper cervical spine: Craniocervical junction normal. Upper cervical spine normal. Bone marrow signal intensity normal. No scalp soft tissue abnormality. Sinuses/Orbits: Globes and orbital soft tissues within normal limits. Scattered mucosal thickening throughout the paranasal sinuses. No mastoid effusion. Inner ear  structures normal. Other: None. IMPRESSION: 1. Patchy small volume acute ischemic cortical infarcts involving the left parietotemporal region as above. Scant associated petechial hemorrhage without hemorrhagic transformation or mass effect. 2. Otherwise normal brain MRI. Electronically Signed   By: Jeannine Boga M.D.   On:  01/06/2018 00:15   Ct Cerebral Perfusion W Contrast  Result Date: 01/05/2018 CLINICAL DATA:  Suspected LEFT MCA syndrome. EXAM: CT PERFUSION BRAIN TECHNIQUE: Multiphase CT imaging of the brain was performed following IV bolus contrast injection. Subsequent parametric perfusion maps were calculated using RAPID software. CONTRAST:  129mL ISOVUE-370 IOPAMIDOL (ISOVUE-370) INJECTION 76% COMPARISON:  CTA head neck performed earlier. FINDINGS: CT Brain Perfusion Findings: CBF (<30%) Volume: 73mL Perfusion (Tmax>6.0s) volume: 19mL Mismatch Volume: 73mL ASPECTS on noncontrast CT Head: 10 at 8:40 p.m. today. Infarct Core: 0 mL Infarction Location:Not applicable. There is a small wedge defect, 15 mL volume, T-max greater than 4 seconds, LEFT parietal cortex which does not correlate with the clinical picture and nor with a definite vascular occlusion. This was discussed with the ordering provider, and it is felt there were no findings on the CTA to explain this appearance. IMPRESSION: CT brain perfusion is negative.  See discussion above. Electronically Signed   By: Staci Righter M.D.   On: 01/05/2018 21:21   Dg Chest Port 1 View  Result Date: 01/05/2018 CLINICAL DATA:  History of fall to the right side EXAM: PORTABLE CHEST 1 VIEW COMPARISON:  10/18/2016, CT 09/21/2010 FINDINGS: Mild elevation of the right diaphragm. No acute airspace disease or effusion. Normal heart size. No pneumothorax. 8 mm metallic density projecting over the right ninth rib medially, on limited cardiac CT from 09/21/2010, this appears to be within the anterior chest wall musculature. IMPRESSION: No active disease. 8 mm metallic opacity over the right lower chest wall. Electronically Signed   By: Donavan Foil M.D.   On: 01/05/2018 23:07   Dg Hand Complete Right  Result Date: 01/05/2018 CLINICAL DATA:  Right hand pain history of fall EXAM: RIGHT HAND - COMPLETE 3+ VIEW COMPARISON:  None. FINDINGS: No acute displaced fracture or malalignment.  Moderate arthritis at the second MCP joint. Minimal degenerative changes at the third and fifth MCP joints. No radiopaque foreign body. IMPRESSION: No acute osseous abnormality Electronically Signed   By: Donavan Foil M.D.   On: 01/05/2018 23:09   Ct Head Code Stroke Wo Contrast  Result Date: 01/05/2018 CLINICAL DATA:  Code stroke. Last seen normal at 1500 hours, RIGHT facial droop with slurred speech. EXAM: CT HEAD WITHOUT CONTRAST TECHNIQUE: Contiguous axial images were obtained from the base of the skull through the vertex without intravenous contrast. COMPARISON:  None. FINDINGS: Brain: No evidence of acute infarction, hemorrhage, hydrocephalus, extra-axial collection or mass lesion/mass effect. Normal for age cerebral volume. No white matter disease. Vascular: No hyperdense vessel or unexpected calcification. Skull: Normal. Negative for fracture or focal lesion. Sinuses/Orbits: No acute finding. Other: None. ASPECTS [facility] Stroke Program Early CT Score) - Ganglionic level infarction (caudate, lentiform nuclei, internal capsule, insula, M1-M3 cortex): 7 - Supraganglionic infarction (M4-M6 cortex): 3 Total score (0-10 with 10 being normal): 10 IMPRESSION: 1. Negative exam 2. ASPECTS is 10. These results were communicated to Dr. Cheral Marker at 8:40 [place] 7/21/2019by text page via the [facility] messaging system. Electronically Signed   By: Staci Righter M.D.   On: 01/05/2018 20:42        Scheduled Meds: .  stroke: mapping our early stages of recovery book   Does not apply  Once  . apixaban  5 mg Oral BID  . aspirin  300 mg Rectal Daily   Or  . aspirin  325 mg Oral Daily  . atorvastatin  80 mg Oral QHS   Continuous Infusions: . sodium chloride 75 mL/hr at 01/06/18 0417  . diltiazem (CARDIZEM) infusion 5 mg/hr (01/06/18 1049)     LOS: 0 days    Time spent:25 mins. More than 50% of that time was spent in counseling and/or coordination of care.      Shelly Coss, MD [facility] Pager [phone]  If 7PM-7AM, please contact night-coverage [email or web address] Password [email or web address] 01/06/2018, 1:16 PM

## 2018-01-06 NOTE — Evaluation (Addendum)
Physical Therapy Evaluation/ Discharge Patient Details Name: Richard [name] MRN: [id number] DOB: 1955/10/23 Today's Date: 01/06/2018   History of Present Illness  62 yo admitted with dysarthria and RUE weakness with MRI demonstrating Left parietotemporal infact with new Afib. PMhx: HTN, HLD, sleep apnea  Clinical Impression  Pt very pleasant and moving well with transfers and gait. Pt with dysarthria and able to produces words at times and able to respond with yes/no at all times. Pt and wife educated for BE FAST.  Pt retired and enjoys golf, fishing, gardening and restoring furniture. Pt with decreased RUE strength, sensation and proprioception. Pt is right handed and attempted to open door with Right hand but with undershooting of handle pt unaware and needed cues to attempt with LUE. Pt also hitting RUE on door frame with return to room and unaware. Pt with good strength bil LE and balance and will defer further needs to OT and SLP. Will sign off with pt and wife aware and agreeable. Encouraged increased mobility throughout the day with supervision.     Follow Up Recommendations No PT follow up    Equipment Recommendations  None recommended by PT    Recommendations for Other Services       Precautions / Restrictions Precautions Precaution Comments: RUE inattention and decreased sensation      Mobility  Bed Mobility Overal bed mobility: Modified Independent                Transfers Overall transfer level: Modified independent                  Ambulation/Gait Ambulation/Gait assistance: Independent Gait Distance (Feet): 500 Feet Assistive device: None Gait Pattern/deviations: WFL(Within Functional Limits)   Gait velocity interpretation: >4.37 ft/sec, indicative of normal walking speed General Gait Details: pt with good stability with gait including vertical and horizontal head turns and change of speed and direction  Stairs            Wheelchair  Mobility    Modified Rankin (Stroke Patients Only) Modified Rankin (Stroke Patients Only) Pre-Morbid Rankin Score: No symptoms Modified Rankin: Moderately severe disability     Balance Overall balance assessment: No apparent balance deficits (not formally assessed)                                           Pertinent Vitals/Pain Pain Assessment: No/denies pain    Home Living Family/patient expects to be discharged to:: Private residence Living Arrangements: Spouse/significant other Available Help at Discharge: Family;Available 24 hours/day Type of Home: House Home Access: Stairs to enter   [employer] of Steps: 2 Home Layout: One level Home Equipment: None      Prior Function Level of Independence: Independent         Comments: pt retired from [employer]. likes to golf, fish, garden, restore Proofreader        Extremity/Trunk Assessment   Upper Extremity Assessment Upper Extremity Assessment: RUE deficits/detail RUE Deficits / Details: pt with impaired sensation, strength and grip with inattention and decreased proprioception RUE Sensation: decreased proprioception;decreased light touch RUE Coordination: decreased fine motor    Lower Extremity Assessment Lower Extremity Assessment: Overall WFL for tasks assessed RLE Deficits / Details: 5/5 hip flexion, knee flexion and extension with sensation WNL    Cervical / Trunk Assessment Cervical / Trunk Assessment: Normal  Communication  Communication: Expressive difficulties  Cognition Arousal/Alertness: Awake/alert Behavior During Therapy: WFL for tasks assessed/performed Overall Cognitive Status: Difficult to assess                                 General Comments: pt able to answer appropriately with yes/no answers and able to provide other short sentences with difficulty      General Comments      Exercises     Assessment/Plan    PT  Assessment Patent does not need any further PT services  PT Problem List         PT Treatment Interventions      PT Goals (Current goals can be found in the Care Plan section)  Acute Rehab PT Goals PT Goal Formulation: All assessment and education complete, DC therapy    Frequency     Barriers to discharge        Co-evaluation               AM-PAC PT "6 Clicks" Daily Activity  Outcome Measure Difficulty turning over in bed (including adjusting bedclothes, sheets and blankets)?: A Little Difficulty moving from lying on back to sitting on the side of the bed? : A Little Difficulty sitting down on and standing up from a chair with arms (e.g., wheelchair, bedside commode, etc,.)?: A Little Help needed moving to and from a bed to chair (including a wheelchair)?: A Little Help needed walking in hospital room?: A Little Help needed climbing 3-5 steps with a railing? : A Little 6 Click Score: 18    End of Session Equipment Utilized During Treatment: Gait belt Activity Tolerance: Patient tolerated treatment well Patient left: in chair;with call bell/phone within reach;with chair alarm set;with family/visitor present Nurse Communication: Mobility status PT Visit Diagnosis: Other symptoms and signs involving the nervous system (R29.898)    Time: 1345-1413 PT Time Calculation (min) (ACUTE ONLY): 28 min   Charges:   PT Evaluation $PT Eval Moderate Complexity: 1 Mod PT Treatments $Gait Training: 8-22 mins   PT G Codes:        Elwyn Reach, PT [phone]   Merit Maybee B Chevis Weisensel 01/06/2018, 2:24 PM

## 2018-01-06 NOTE — ED Notes (Signed)
Attempted to called report - nurse has not had a chance to review chart - requests that I call back in 15 minutes to given report

## 2018-01-06 NOTE — ED Notes (Signed)
Report called to Aaron Edelman, RN on 3W - ready to accept pt

## 2018-01-06 NOTE — Progress Notes (Addendum)
STROKE TEAM PROGRESS NOTE  HPI ( Dr Cheral Marker ) Richard [name] is a right handed 62 y.o. male presenting with acute onset of right sided weakness, right facial droop with expressive and receptive dysphasia. LKN was 1500 when wife left the house as he was doing work in the yard. When she came home he was sleeping in a bedroom with door closed. When she went to check on him at 1915, she noted the above deficits. On further interview with wife's assistance in the context of patient's fragmentary speech output, he started to feel strange while working in the yard, then went into the garage where he fell on his right side, then got up and went to the bedroom to rest. He has no memory lapse regarding the event and does not think that he had a seizure. He has no prior history of seizure or stroke. He is on a statin. Not taking a blood thinner or antiplatelet agent. However, wife gave him an ASA prior to coming to the ED. Of note, EMS saw atrial fibrillation on EKG performed in the field; patient has no prior diagnosis of such. A-fib is confirmed on EKG performed in the [facility] ED. He has a history of EtOH abuse but has been sober for the past 12 years. Denies headache.    INTERVAL HISTORY His daughter is at the bedside.  EEG testing underway. Neuro stable.  Dr. Leonie Man discussed diagnosis, work-up, prognosis and treatment with daughter and addressed all her questionsover night. Dr. Leonie Man   Vitals:   01/06/18 0721 01/06/18 0745 01/06/18 0800 01/06/18 0827  BP: (!) 137/102 138/79 (!) 128/99 (!) 127/104  Pulse: 86 91 79 72  Resp: 20 (!) 21 14 18   Temp: 98.2 F (36.8 C)   98.7 F (37.1 C)  TempSrc: Oral   Oral  SpO2: 98% 96% 99% 99%  Weight:      Height:        CBC:  Recent Labs  Lab 01/05/18 2028 01/05/18 2036  WBC 12.5*  --   NEUTROABS 10.2*  --   HGB 16.4 16.3  HCT 48.4 48.0  MCV 90.8  --   PLT 166  --     Basic Metabolic Panel:  Recent Labs  Lab 01/05/18 2028 01/05/18 2036  NA 143 142  K 3.7  3.6  CL 110 110  CO2 18*  --   GLUCOSE 123* 120*  BUN 15 17  CREATININE 1.26* 1.10  CALCIUM 9.2  --    Lipid Panel:     Component Value Date/Time   CHOL 250 (H) 01/06/2018 0438   TRIG 86 01/06/2018 0438   HDL 41 01/06/2018 0438   CHOLHDL 6.1 01/06/2018 0438   VLDL 17 01/06/2018 0438   LDLCALC 192 (H) 01/06/2018 0438   HgbA1c:  Lab Results  Component Value Date   HGBA1C 5.5 01/06/2018   Urine Drug Screen:     Component Value Date/Time   LABOPIA NONE DETECTED 01/05/2018 2028   COCAINSCRNUR NONE DETECTED 01/05/2018 2028   LABBENZ NONE DETECTED 01/05/2018 2028   AMPHETMU NONE DETECTED 01/05/2018 2028   THCU NONE DETECTED 01/05/2018 2028   LABBARB NONE DETECTED 01/05/2018 2028    Alcohol Level     Component Value Date/Time   ETH <10 01/05/2018 2028    IMAGING Ct Angio Head W Or Wo Contrast  Result Date: 01/05/2018 CLINICAL DATA:  Code stroke, slurred speech, RIGHT facial droop. EXAM: CT ANGIOGRAPHY HEAD AND NECK TECHNIQUE: Multidetector CT imaging of the head  and neck was performed using the standard protocol during bolus administration of intravenous contrast. Multiplanar CT image reconstructions and MIPs were obtained to evaluate the vascular anatomy. Carotid stenosis measurements (when applicable) are obtained utilizing NASCET criteria, using the distal internal carotid diameter as the denominator. CONTRAST:  136mL ISOVUE-370 IOPAMIDOL (ISOVUE-370) INJECTION 76% COMPARISON:  CT head earlier today. FINDINGS: CTA NECK FINDINGS Aortic arch: Bovine trunk. Imaged portion shows no evidence of aneurysm or dissection. No significant stenosis of the major arch vessel origins. Right carotid system: No evidence of dissection, stenosis (50% or greater) or occlusion. Left carotid system: No evidence of dissection, stenosis (50% or greater) or occlusion. Vertebral arteries: LEFT dominant. No evidence of dissection, stenosis (50% or greater) or occlusion. Skeleton: Spondylosis.  No  aggressive bone lesions. Mandibular tori. Other neck: No mass.  Airway midline. Upper chest: Unremarkable. Review of the MIP images confirms the above findings CTA HEAD FINDINGS Anterior circulation: No significant stenosis, proximal occlusion, aneurysm, or vascular malformation. Mild irregularity of the distal MCA branches suggesting intracranial atherosclerotic disease. Posterior circulation: No significant stenosis, proximal occlusion, aneurysm, or vascular malformation. Venous sinuses: As permitted by contrast timing, patent. Anatomic variants: None of significance. Delayed phase: Not performed. Review of the MIP images confirms the above findings IMPRESSION: No intracranial or extracranial stenosis or occlusion. Mild irregularity of the distal MCA branches suggesting intracranial atherosclerotic change. These results were called by telephone at the time of interpretation on 01/05/2018 at 8:54 pm to Dr. Kerney Elbe , who verbally acknowledged these results. Electronically Signed   By: Staci Righter M.D.   On: 01/05/2018 20:57   Dg Shoulder Right  Result Date: 01/05/2018 CLINICAL DATA:  Shoulder pain after fall EXAM: RIGHT SHOULDER - 2+ VIEW COMPARISON:  None. FINDINGS: Moderate AC joint degenerative changes. No acute displaced fracture or malalignment. IMPRESSION: No acute osseous abnormality Electronically Signed   By: Donavan Foil M.D.   On: 01/05/2018 23:10   Ct Angio Neck W Or Wo Contrast  Result Date: 01/05/2018 CLINICAL DATA:  Code stroke, slurred speech, RIGHT facial droop. EXAM: CT ANGIOGRAPHY HEAD AND NECK TECHNIQUE: Multidetector CT imaging of the head and neck was performed using the standard protocol during bolus administration of intravenous contrast. Multiplanar CT image reconstructions and MIPs were obtained to evaluate the vascular anatomy. Carotid stenosis measurements (when applicable) are obtained utilizing NASCET criteria, using the distal internal carotid diameter as the  denominator. CONTRAST:  170mL ISOVUE-370 IOPAMIDOL (ISOVUE-370) INJECTION 76% COMPARISON:  CT head earlier today. FINDINGS: CTA NECK FINDINGS Aortic arch: Bovine trunk. Imaged portion shows no evidence of aneurysm or dissection. No significant stenosis of the major arch vessel origins. Right carotid system: No evidence of dissection, stenosis (50% or greater) or occlusion. Left carotid system: No evidence of dissection, stenosis (50% or greater) or occlusion. Vertebral arteries: LEFT dominant. No evidence of dissection, stenosis (50% or greater) or occlusion. Skeleton: Spondylosis.  No aggressive bone lesions. Mandibular tori. Other neck: No mass.  Airway midline. Upper chest: Unremarkable. Review of the MIP images confirms the above findings CTA HEAD FINDINGS Anterior circulation: No significant stenosis, proximal occlusion, aneurysm, or vascular malformation. Mild irregularity of the distal MCA branches suggesting intracranial atherosclerotic disease. Posterior circulation: No significant stenosis, proximal occlusion, aneurysm, or vascular malformation. Venous sinuses: As permitted by contrast timing, patent. Anatomic variants: None of significance. Delayed phase: Not performed. Review of the MIP images confirms the above findings IMPRESSION: No intracranial or extracranial stenosis or occlusion. Mild irregularity of the distal MCA branches  suggesting intracranial atherosclerotic change. These results were called by telephone at the time of interpretation on 01/05/2018 at 8:54 pm to Dr. Kerney Elbe , who verbally acknowledged these results. Electronically Signed   By: Staci Righter M.D.   On: 01/05/2018 20:57   Mr Brain Wo Contrast  Result Date: 01/06/2018 CLINICAL DATA:  Initial evaluation for focal neural deficit. EXAM: MRI HEAD WITHOUT CONTRAST TECHNIQUE: Multiplanar, multiecho pulse sequences of the brain and surrounding structures were obtained without intravenous contrast. COMPARISON:  Prior study from  earlier the same day. FINDINGS: Brain: Cerebral volume within normal limits. No significant cerebral white matter changes. Patchy small volume foci of diffusion abnormality seen involving the cortical gray matter of the left temporal and parietal region (series 5, image 57, 63, 67), consistent with acute ischemic infarcts. Single small focus of susceptibility artifact at the parietal region suggestive of petechial hemorrhage (series 12, image 640). No frank hemorrhagic transformation. No mass effect. No other evidence for acute or subacute ischemia. Gray-white matter differentiation otherwise maintained. No other areas of chronic infarction. No other evidence for acute or chronic intracranial hemorrhage. No mass lesion, midline shift or mass effect. No hydrocephalus. No extra-axial fluid collection. Pituitary gland normal. Vascular: Major intracranial vascular flow voids maintained Skull and upper cervical spine: Craniocervical junction normal. Upper cervical spine normal. Bone marrow signal intensity normal. No scalp soft tissue abnormality. Sinuses/Orbits: Globes and orbital soft tissues within normal limits. Scattered mucosal thickening throughout the paranasal sinuses. No mastoid effusion. Inner ear structures normal. Other: None. IMPRESSION: 1. Patchy small volume acute ischemic cortical infarcts involving the left parietotemporal region as above. Scant associated petechial hemorrhage without hemorrhagic transformation or mass effect. 2. Otherwise normal brain MRI. Electronically Signed   By: Jeannine Boga M.D.   On: 01/06/2018 00:15   Ct Cerebral Perfusion W Contrast  Result Date: 01/05/2018 CLINICAL DATA:  Suspected LEFT MCA syndrome. EXAM: CT PERFUSION BRAIN TECHNIQUE: Multiphase CT imaging of the brain was performed following IV bolus contrast injection. Subsequent parametric perfusion maps were calculated using RAPID software. CONTRAST:  179mL ISOVUE-370 IOPAMIDOL (ISOVUE-370) INJECTION 76%  COMPARISON:  CTA head neck performed earlier. FINDINGS: CT Brain Perfusion Findings: CBF (<30%) Volume: 29mL Perfusion (Tmax>6.0s) volume: 34mL Mismatch Volume: 42mL ASPECTS on noncontrast CT Head: 10 at 8:40 p.m. today. Infarct Core: 0 mL Infarction Location:Not applicable. There is a small wedge defect, 15 mL volume, T-max greater than 4 seconds, LEFT parietal cortex which does not correlate with the clinical picture and nor with a definite vascular occlusion. This was discussed with the ordering provider, and it is felt there were no findings on the CTA to explain this appearance. IMPRESSION: CT brain perfusion is negative.  See discussion above. Electronically Signed   By: Staci Righter M.D.   On: 01/05/2018 21:21   Dg Chest Port 1 View  Result Date: 01/05/2018 CLINICAL DATA:  History of fall to the right side EXAM: PORTABLE CHEST 1 VIEW COMPARISON:  10/18/2016, CT 09/21/2010 FINDINGS: Mild elevation of the right diaphragm. No acute airspace disease or effusion. Normal heart size. No pneumothorax. 8 mm metallic density projecting over the right ninth rib medially, on limited cardiac CT from 09/21/2010, this appears to be within the anterior chest wall musculature. IMPRESSION: No active disease. 8 mm metallic opacity over the right lower chest wall. Electronically Signed   By: Donavan Foil M.D.   On: 01/05/2018 23:07   Dg Hand Complete Right  Result Date: 01/05/2018 CLINICAL DATA:  Right hand pain history  of fall EXAM: RIGHT HAND - COMPLETE 3+ VIEW COMPARISON:  None. FINDINGS: No acute displaced fracture or malalignment. Moderate arthritis at the second MCP joint. Minimal degenerative changes at the third and fifth MCP joints. No radiopaque foreign body. IMPRESSION: No acute osseous abnormality Electronically Signed   By: Donavan Foil M.D.   On: 01/05/2018 23:09   Ct Head Code Stroke Wo Contrast  Result Date: 01/05/2018 CLINICAL DATA:  Code stroke. Last seen normal at 1500 hours, RIGHT facial droop  with slurred speech. EXAM: CT HEAD WITHOUT CONTRAST TECHNIQUE: Contiguous axial images were obtained from the base of the skull through the vertex without intravenous contrast. COMPARISON:  None. FINDINGS: Brain: No evidence of acute infarction, hemorrhage, hydrocephalus, extra-axial collection or mass lesion/mass effect. Normal for age cerebral volume. No white matter disease. Vascular: No hyperdense vessel or unexpected calcification. Skull: Normal. Negative for fracture or focal lesion. Sinuses/Orbits: No acute finding. Other: None. ASPECTS [facility] Stroke Program Early CT Score) - Ganglionic level infarction (caudate, lentiform nuclei, internal capsule, insula, M1-M3 cortex): 7 - Supraganglionic infarction (M4-M6 cortex): 3 Total score (0-10 with 10 being normal): 10 IMPRESSION: 1. Negative exam 2. ASPECTS is 10. These results were communicated to Dr. Cheral Marker at 8:40 [place] 7/21/2019by text page via the [facility] messaging system. Electronically Signed   By: Staci Righter M.D.   On: 01/05/2018 20:42   2D echocardiogram 01/06/2018 Pending  EEG 01/06/2018 This is a normal awake electroencephalogram. There are no focal lateralizing or epileptiform features.   PHYSICAL EXAM  pleasant middle-age male currently not in distress. Irregularly irregular heart rhythm. . Afebrile. Head is nontraumatic. Neck is supple without bruit.    Cardiac exam no murmur or gallop. Lungs are clear to auscultation. Distal pulses are well felt. Neurological Exam :  Awake alert oriented 2. Moderate expressive and mild receptive aphasia. Can follow simple commands. Difficulty with naming and repetition. Mild dysarthria. Extraocular moments are full range without nystagmus. Blinks to threat bilaterally. Right lower facial weakness. Tongue midline. Motor system exam shows right hemiparesis with right upper extremity strength aorta 5 with significant weakness of right grip and intrinsic hand muscles. Right lower extremity is 4/5. Normal  strength in the left side. Sensation appears intact. Deep tendon reflexes symmetric. Plantars downgoing. Gait not tested. ASSESSMENT/PLAN Richard [name] is a 62 y.o. male with history of ETOH abuse in remission x 12 yrs, HTN, HLD, OSA presenting with expressive aphasia.   Stroke:   Patchy small left parietal temporal cortical infarcts embolic secondary to new onset atrial fibrillation   Code Stroke CT head No acute stroke. ASPECTS 10.     CTA head & neck no significant  stenosis.  CT perfusion negative  MRI patchy small left parietal temporal cortical infarcts with associated petechial hemorrhage  EEG-no seizure LDL 192  HgbA1c 5.5  Eliquis for VTE prophylaxis  aspirin 325 mg daily prior to admission, new A. fib lead to IV heparin, replace with full dose Lovenox now changed to Eliquis 5 mg twice daily for secondary stroke prevention   Therapy recommendations:  No PT  Disposition:  pending   Atrial Fibrillation w/ RVR, new dx  Home anticoagulation:  none   On cardizem. Started for HTN during ETOH withdrawal..  CHA2DS2-VASc Score = at least 3, ?2 oral anticoagulation recommended  Age in Years:  <65   0  Sex:  Male   0  Hypertension History:  yes   +1     Diabetes Mellitus:  0  Congestive  Heart Failure History:  0  Vascular Disease History:  0     Stroke/TIA/Thromboembolism History:  yes   +2 . Continue Eliquis (apixaban) daily at discharge  Hypertension  Stable . Permissive hypertension (OK if < 180 given petechial hemorrhage) but gradually normalize in 5-7 days . Long-term BP goal normotensive  Hyperlipidemia  Home meds: Lipitor 80, resumed in hospital  LDL 192, goal < 70  Continue statin at discharge  Other Stroke Risk Factors  Hx ETOH abuse, stopped ~12 yrs ago  Coronary artery disease  Obstructive sleep apnea, used CPAP at home in the past, but not for 10 years  Hospital day # 0  Richard Sabin, MSN, APRN, ANVP-BC, AGPCNP-BC Advanced Practice  Stroke Nurse [place] for Schedule & Pager information 01/06/2018 11:40 AM  I have personally examined this patient, reviewed notes, independently viewed imaging studies, participated in medical decision making and plan of care.ROS completed by me personally and pertinent positives fully documented  I have made any additions or clarifications directly to the above note. Agree with note above. He presented with aphasia and right hemiplegia secondary to embolic left MCA infarct due to atrial fibrillation. She remains at risk for neurological worsening and further strokes and will benefit with long-term anticoagulation.aggressive risk factor modification. Treatment for hyperlipidemia Discussed with patient and family at the bedside and Dr.Adhikari. Greater than 50% time during this 35 min visit was spent on counseling and coordination of care about his embolic stroke and discussion about prevention and treatment options  Antony Contras, MD Medical Director [facility] Pager: [phone] 01/06/2018 5:29 PM  To contact Stroke Continuity provider, please refer to [email or web address] After hours, contact General Neurology

## 2018-01-06 NOTE — Progress Notes (Signed)
  Echocardiogram 2D Echocardiogram has been performed.  Richard [name] 01/06/2018, 2:54 PM

## 2018-01-06 NOTE — Consult Note (Addendum)
Cardiology Consultation:   Patient ID: Richard [name]; [id number]; [birth year]   Admit date: 01/05/2018 Date of Consult: 01/06/2018  Primary Care Provider: Biagio Borg, MD Primary Cardiologist: Peter Martinique, MD remote 2012 Primary Electrophysiologist:  NA   Patient Profile:   Richard [name] is a 62 y.o. male with a hx of cad on cardiac CTA in 2012 with <40% stenosis in pLAD with a focal area of calcification, HTN, HLD, OSA  who is being seen today for the evaluation of A fib at the request of Dr. Tawanna Solo.  History of Present Illness:   Richard [name] with hx as above presented to [facility]/05/18 with CVA by EMS, Rt sided deficits and aphasia and new onset A. Fib.   EKG #1 A fib with RVR and no acute changes except rhythm, same for EKG #2,  EKG #3 with SR with PACs. EKG #4 a fib RVR TELE I personally reviewed --a fib with RVR at times  Troponin 0.02 Na 142, K+ 3.6, CL 110, Cr 1.10  Hgb 16.4  LDL 192 Tchol 250 HDL 41 TSH 1.077  IV dilt drip was started  Echo pending  Currently has difficulty with speech but is able to move Rt arm.  He is not aware of irregular HR and has not been aware of any irregular HR.  No chest pain, and has been able to do regular activities without problems.      Past Medical History:  Diagnosis Date  . Alcoholism in remission ([place]) 08/25/2011  . Allergy    seasonal  . CAD (coronary artery disease) 08/25/2011  . Depression   . Hyperlipidemia   . Hypertension   . Sleep apnea    did use cpap about 10 years ago no use now    Past Surgical History:  Procedure Laterality Date  . COLONOSCOPY  10-22-11   3 polyps  . FINGER NEUROPLASTY     left index finger  . left achillies  2003  . POLYPECTOMY       Home Medications:  Prior to Admission medications   Medication Sig Start Date End Date Taking? Authorizing Provider  aspirin EC 325 MG tablet Take 325 mg by mouth once.   Yes [provider]  atenolol (TENORMIN) 25 MG tablet Take 1 tablet (25 mg  total) by mouth 2 (two) times daily. 11/01/17  Yes Biagio Borg, MD  atorvastatin (LIPITOR) 80 MG tablet Take 1 tablet (80 mg total) by mouth daily. Patient taking differently: Take 80 mg by mouth at bedtime.  11/01/17  Yes Biagio Borg, MD  diltiazem (DILT-XR) 240 MG 24 hr capsule TAKE 1 CAPSULE (240 MG TOTAL) BY MOUTH DAILY. Patient taking differently: Take 240 mg by mouth daily.  11/01/17  Yes Biagio Borg, MD  montelukast (SINGULAIR) 10 MG tablet Take 1 tablet (10 mg total) by mouth daily. 11/01/17  Yes Biagio Borg, MD  triamcinolone (NASACORT AQ) 55 MCG/ACT AERO nasal inhaler Place 2 sprays into the nose daily. Patient taking differently: Place 2 sprays into the nose daily as needed (congestion).  11/01/17  Yes Biagio Borg, MD    Inpatient Medications: Scheduled Meds: .  stroke: mapping our early stages of recovery book   Does not apply Once  . apixaban  5 mg Oral BID  . aspirin  300 mg Rectal Daily   Or  . aspirin  325 mg Oral Daily  . atorvastatin  80 mg Oral QHS   Continuous Infusions: . sodium chloride 75  mL/hr at 01/06/18 0417  . diltiazem (CARDIZEM) infusion 5 mg/hr (01/06/18 1049)   PRN Meds: acetaminophen **OR** acetaminophen (TYLENOL) oral liquid 160 mg/5 mL **OR** acetaminophen  Allergies:    Allergies  Allergen Reactions  . Penicillins Other (See Comments)    Siblings are allergic Has patient had a PCN reaction causing immediate rash, facial/tongue/throat swelling, SOB or lightheadedness with hypotension: Unknown Has patient had a PCN reaction causing severe rash involving mucus membranes or skin necrosis: Unknown Has patient had a PCN reaction that required hospitalization: Unknown Has patient had a PCN reaction occurring within the last 10 years: Unknown If all of the above answers are "NO", then may proceed with Cephalosporin use.    Social History:   Social History   Socioeconomic History  . Marital status: Married    Spouse name: Not on file  .  Number of children: 2  . Years of education: Not on file  . Highest education level: Not on file  Occupational History  . Occupation: Teacher, adult education    Comment: [place]  . Financial resource strain: Not on file  . Food insecurity:    Worry: Not on file    Inability: Not on file  . Transportation needs:    Medical: Not on file    Non-medical: Not on file  Tobacco Use  . Smoking status: Never Smoker  . Smokeless tobacco: Never Used  Substance and Sexual Activity  . Alcohol use: No  . Drug use: No  . Sexual activity: Not on file  Lifestyle  . Physical activity:    Days per week: Not on file    Minutes per session: Not on file  . Stress: Not on file  Relationships  . Social connections:    Talks on phone: Not on file    Gets together: Not on file    Attends religious service: Not on file    Active member of club or organization: Not on file    Attends meetings of clubs or organizations: Not on file    Relationship status: Not on file  . Intimate partner violence:    Fear of current or ex partner: Not on file    Emotionally abused: Not on file    Physically abused: Not on file    Forced sexual activity: Not on file  Other Topics Concern  . Not on file  Social History Narrative  . Not on file    Family History:    Family History  Problem Relation Age of Onset  . Hypertension Mother   . Diabetes Mother   . Heart attack Brother   . Colon cancer Neg Hx   . Stomach cancer Neg Hx   . Rectal cancer Neg Hx   . Esophageal cancer Neg Hx      ROS:  Please see the history of present illness.  General:no colds or fevers, no weight changes Skin:no rashes or ulcers HEENT:no blurred vision, no congestion CV:see HPI PUL:see HPI GI:no diarrhea constipation or melena, no indigestion GU:no hematuria, no dysuria MS:no joint pain, no claudication Neuro:no syncope, no lightheadedness Endo:no diabetes, no thyroid disease  All other ROS reviewed and negative.      Physical Exam/Data:   Vitals:   01/06/18 0845 01/06/18 1050 01/06/18 1204 01/06/18 1419  BP: (!) 120/100 (!) 125/102 (!) 130/104   Pulse: 80 90 67 (!) 109  Resp:   20   Temp: 98.2 F (36.8 C) 98.7 F (37.1 C) 98.3 F (36.8  C)   TempSrc: Oral Oral Oral   SpO2: 100% 98% 99%   Weight:      Height:        Intake/Output Summary (Last 24 hours) at 01/06/2018 1454 Last data filed at 01/06/2018 1117 Gross per 24 hour  Intake 240 ml  Output 745 ml  Net -505 ml   Filed Weights   01/05/18 2118  Weight: 210 lb 5.1 oz (95.4 kg)   Body mass index is 28.52 kg/m.  General:  Well nourished, well developed, in no acute distress resting comfortable, wife is in the room HEENT: normal Lymph: no adenopathy Neck: no JVD Endocrine:  No thryomegaly Vascular: No carotid bruits; post tib pulses 3+ bilaterally without bruits  Cardiac: irreg irreg; no murmur, gallup rub or click  Lungs:  clear to auscultation bilaterally, no wheezing, rhonchi or rales  Abd: soft, nontender, no hepatomegaly  Ext: no edema Musculoskeletal:  No deformities, BUE and BLE strength normal and equal Skin: warm and dry  Neuro:  Alert and oriented, MAE though rt side weaker than lt, speech is slow to none Psych:  Normal affect    Relevant CV Studies: Echo Pending  Cardiac CT  09/2010 Cardiac CT:  Indication: Abnormal stress test  Protocol:  The patient was scanned on a Philips 256 scanner.  SL nitro was given. No beta blocker was needed. Average HR during scan 58 bpm.  Gantry rotation speed 300 msec with collimation .9 mm.  A prospective scan at 100kv and 5% phase tolerance used.  iDose 3 mA 300.  Patient received 80cc of contrast.  3D data set reviewed on a [employer]era[employer] work station using MIP,VRT and MPR modes  Findings:  Calcium score: 90 with a single foci of dense calcium in the proximal LAD  Coronary Arteries: Right dominant with no anomalies.  LM normal, LAD less than 40% calcific proximal LAD  disease.  IM large and normal.  Circumflex normal, OM1 and OM2 small and normal.  RCA normal.  No significant abnormalities on the lung and soft tissue windows. See separate report from Dr. Rolla Flatten [facility] Radiology  Impression:     1)    Calcium Score 90        2)    Less than 40% calcific lesion in the proximal LAD.  No other significant CAD 3)    No significant lesions on soft tissue and lung windows Copy to Dr. Peter Martinique  Laboratory Data:  Chemistry Recent Labs  Lab 01/05/18 2028 01/05/18 2036  NA 143 142  K 3.7 3.6  CL 110 110  CO2 18*  --   GLUCOSE 123* 120*  BUN 15 17  CREATININE 1.26* 1.10  CALCIUM 9.2  --   GFRNONAA 59*  --   GFRAA >60  --   ANIONGAP 15  --     Recent Labs  Lab 01/05/18 2028  PROT 7.0  ALBUMIN 4.1  AST 31  ALT 31  ALKPHOS 73  BILITOT 1.0   Hematology Recent Labs  Lab 01/05/18 2028 01/05/18 2036  WBC 12.5*  --   RBC 5.33  --   HGB 16.4 16.3  HCT 48.4 48.0  MCV 90.8  --   MCH 30.8  --   MCHC 33.9  --   RDW 12.7  --   PLT 166  --    Cardiac EnzymesNo results for input(s): TROPONINI in the last 168 hours.  Recent Labs  Lab 01/05/18 2034  TROPIPOC 0.02    BNPNo  results for input(s): BNP, PROBNP in the last 168 hours.  DDimer No results for input(s): DDIMER in the last 168 hours.  Radiology/Studies:  Ct Angio Head W Or Wo Contrast  Result Date: 01/05/2018 CLINICAL DATA:  Code stroke, slurred speech, RIGHT facial droop. EXAM: CT ANGIOGRAPHY HEAD AND NECK TECHNIQUE: Multidetector CT imaging of the head and neck was performed using the standard protocol during bolus administration of intravenous contrast. Multiplanar CT image reconstructions and MIPs were obtained to evaluate the vascular anatomy. Carotid stenosis measurements (when applicable) are obtained utilizing NASCET criteria, using the distal internal carotid diameter as the denominator. CONTRAST:  135mL ISOVUE-370 IOPAMIDOL (ISOVUE-370) INJECTION 76%  COMPARISON:  CT head earlier today. FINDINGS: CTA NECK FINDINGS Aortic arch: Bovine trunk. Imaged portion shows no evidence of aneurysm or dissection. No significant stenosis of the major arch vessel origins. Right carotid system: No evidence of dissection, stenosis (50% or greater) or occlusion. Left carotid system: No evidence of dissection, stenosis (50% or greater) or occlusion. Vertebral arteries: LEFT dominant. No evidence of dissection, stenosis (50% or greater) or occlusion. Skeleton: Spondylosis.  No aggressive bone lesions. Mandibular tori. Other neck: No mass.  Airway midline. Upper chest: Unremarkable. Review of the MIP images confirms the above findings CTA HEAD FINDINGS Anterior circulation: No significant stenosis, proximal occlusion, aneurysm, or vascular malformation. Mild irregularity of the distal MCA branches suggesting intracranial atherosclerotic disease. Posterior circulation: No significant stenosis, proximal occlusion, aneurysm, or vascular malformation. Venous sinuses: As permitted by contrast timing, patent. Anatomic variants: None of significance. Delayed phase: Not performed. Review of the MIP images confirms the above findings IMPRESSION: No intracranial or extracranial stenosis or occlusion. Mild irregularity of the distal MCA branches suggesting intracranial atherosclerotic change. These results were called by telephone at the time of interpretation on 01/05/2018 at 8:54 pm to Dr. Kerney Elbe , who verbally acknowledged these results. Electronically Signed   By: Staci Righter M.D.   On: 01/05/2018 20:57   Dg Shoulder Right  Result Date: 01/05/2018 CLINICAL DATA:  Shoulder pain after fall EXAM: RIGHT SHOULDER - 2+ VIEW COMPARISON:  None. FINDINGS: Moderate AC joint degenerative changes. No acute displaced fracture or malalignment. IMPRESSION: No acute osseous abnormality Electronically Signed   By: Donavan Foil M.D.   On: 01/05/2018 23:10   Ct Angio Neck W Or Wo  Contrast  Result Date: 01/05/2018 CLINICAL DATA:  Code stroke, slurred speech, RIGHT facial droop. EXAM: CT ANGIOGRAPHY HEAD AND NECK TECHNIQUE: Multidetector CT imaging of the head and neck was performed using the standard protocol during bolus administration of intravenous contrast. Multiplanar CT image reconstructions and MIPs were obtained to evaluate the vascular anatomy. Carotid stenosis measurements (when applicable) are obtained utilizing NASCET criteria, using the distal internal carotid diameter as the denominator. CONTRAST:  166mL ISOVUE-370 IOPAMIDOL (ISOVUE-370) INJECTION 76% COMPARISON:  CT head earlier today. FINDINGS: CTA NECK FINDINGS Aortic arch: Bovine trunk. Imaged portion shows no evidence of aneurysm or dissection. No significant stenosis of the major arch vessel origins. Right carotid system: No evidence of dissection, stenosis (50% or greater) or occlusion. Left carotid system: No evidence of dissection, stenosis (50% or greater) or occlusion. Vertebral arteries: LEFT dominant. No evidence of dissection, stenosis (50% or greater) or occlusion. Skeleton: Spondylosis.  No aggressive bone lesions. Mandibular tori. Other neck: No mass.  Airway midline. Upper chest: Unremarkable. Review of the MIP images confirms the above findings CTA HEAD FINDINGS Anterior circulation: No significant stenosis, proximal occlusion, aneurysm, or vascular malformation. Mild irregularity of the  distal MCA branches suggesting intracranial atherosclerotic disease. Posterior circulation: No significant stenosis, proximal occlusion, aneurysm, or vascular malformation. Venous sinuses: As permitted by contrast timing, patent. Anatomic variants: None of significance. Delayed phase: Not performed. Review of the MIP images confirms the above findings IMPRESSION: No intracranial or extracranial stenosis or occlusion. Mild irregularity of the distal MCA branches suggesting intracranial atherosclerotic change. These results  were called by telephone at the time of interpretation on 01/05/2018 at 8:54 pm to Dr. Kerney Elbe , who verbally acknowledged these results. Electronically Signed   By: Staci Righter M.D.   On: 01/05/2018 20:57   Mr Brain Wo Contrast  Result Date: 01/06/2018 CLINICAL DATA:  Initial evaluation for focal neural deficit. EXAM: MRI HEAD WITHOUT CONTRAST TECHNIQUE: Multiplanar, multiecho pulse sequences of the brain and surrounding structures were obtained without intravenous contrast. COMPARISON:  Prior study from earlier the same day. FINDINGS: Brain: Cerebral volume within normal limits. No significant cerebral white matter changes. Patchy small volume foci of diffusion abnormality seen involving the cortical gray matter of the left temporal and parietal region (series 5, image 57, 63, 67), consistent with acute ischemic infarcts. Single small focus of susceptibility artifact at the parietal region suggestive of petechial hemorrhage (series 12, image 640). No frank hemorrhagic transformation. No mass effect. No other evidence for acute or subacute ischemia. Gray-white matter differentiation otherwise maintained. No other areas of chronic infarction. No other evidence for acute or chronic intracranial hemorrhage. No mass lesion, midline shift or mass effect. No hydrocephalus. No extra-axial fluid collection. Pituitary gland normal. Vascular: Major intracranial vascular flow voids maintained Skull and upper cervical spine: Craniocervical junction normal. Upper cervical spine normal. Bone marrow signal intensity normal. No scalp soft tissue abnormality. Sinuses/Orbits: Globes and orbital soft tissues within normal limits. Scattered mucosal thickening throughout the paranasal sinuses. No mastoid effusion. Inner ear structures normal. Other: None. IMPRESSION: 1. Patchy small volume acute ischemic cortical infarcts involving the left parietotemporal region as above. Scant associated petechial hemorrhage without  hemorrhagic transformation or mass effect. 2. Otherwise normal brain MRI. Electronically Signed   By: Jeannine Boga M.D.   On: 01/06/2018 00:15   Ct Cerebral Perfusion W Contrast  Result Date: 01/05/2018 CLINICAL DATA:  Suspected LEFT MCA syndrome. EXAM: CT PERFUSION BRAIN TECHNIQUE: Multiphase CT imaging of the brain was performed following IV bolus contrast injection. Subsequent parametric perfusion maps were calculated using RAPID software. CONTRAST:  138mL ISOVUE-370 IOPAMIDOL (ISOVUE-370) INJECTION 76% COMPARISON:  CTA head neck performed earlier. FINDINGS: CT Brain Perfusion Findings: CBF (<30%) Volume: 32mL Perfusion (Tmax>6.0s) volume: 27mL Mismatch Volume: 21mL ASPECTS on noncontrast CT Head: 10 at 8:40 p.m. today. Infarct Core: 0 mL Infarction Location:Not applicable. There is a small wedge defect, 15 mL volume, T-max greater than 4 seconds, LEFT parietal cortex which does not correlate with the clinical picture and nor with a definite vascular occlusion. This was discussed with the ordering provider, and it is felt there were no findings on the CTA to explain this appearance. IMPRESSION: CT brain perfusion is negative.  See discussion above. Electronically Signed   By: Staci Righter M.D.   On: 01/05/2018 21:21   Dg Chest Port 1 View  Result Date: 01/05/2018 CLINICAL DATA:  History of fall to the right side EXAM: PORTABLE CHEST 1 VIEW COMPARISON:  10/18/2016, CT 09/21/2010 FINDINGS: Mild elevation of the right diaphragm. No acute airspace disease or effusion. Normal heart size. No pneumothorax. 8 mm metallic density projecting over the right ninth rib medially, on limited  cardiac CT from 09/21/2010, this appears to be within the anterior chest wall musculature. IMPRESSION: No active disease. 8 mm metallic opacity over the right lower chest wall. Electronically Signed   By: Donavan Foil M.D.   On: 01/05/2018 23:07   Dg Hand Complete Right  Result Date: 01/05/2018 CLINICAL DATA:  Right hand  pain history of fall EXAM: RIGHT HAND - COMPLETE 3+ VIEW COMPARISON:  None. FINDINGS: No acute displaced fracture or malalignment. Moderate arthritis at the second MCP joint. Minimal degenerative changes at the third and fifth MCP joints. No radiopaque foreign body. IMPRESSION: No acute osseous abnormality Electronically Signed   By: Donavan Foil M.D.   On: 01/05/2018 23:09   Ct Head Code Stroke Wo Contrast  Result Date: 01/05/2018 CLINICAL DATA:  Code stroke. Last seen normal at 1500 hours, RIGHT facial droop with slurred speech. EXAM: CT HEAD WITHOUT CONTRAST TECHNIQUE: Contiguous axial images were obtained from the base of the skull through the vertex without intravenous contrast. COMPARISON:  None. FINDINGS: Brain: No evidence of acute infarction, hemorrhage, hydrocephalus, extra-axial collection or mass lesion/mass effect. Normal for age cerebral volume. No white matter disease. Vascular: No hyperdense vessel or unexpected calcification. Skull: Normal. Negative for fracture or focal lesion. Sinuses/Orbits: No acute finding. Other: None. ASPECTS [facility] Stroke Program Early CT Score) - Ganglionic level infarction (caudate, lentiform nuclei, internal capsule, insula, M1-M3 cortex): 7 - Supraganglionic infarction (M4-M6 cortex): 3 Total score (0-10 with 10 being normal): 10 IMPRESSION: 1. Negative exam 2. ASPECTS is 10. These results were communicated to Dr. Cheral Marker at 8:40 [place] 7/21/2019by text page via the [facility] messaging system. Electronically Signed   By: Staci Righter M.D.   On: 01/05/2018 20:42    Assessment and Plan:   1. PAF with RVR, now on Eliquis 5 mg BID,  CHA2DS2VASc 3.  On dilt drip for rate control at 5, may need higher dose, rate is not controlled, or we could add low dose BB, though do not want to decrease BP severely --Dr. Marlou Porch to see.  2. Acute CVA per neuro 3. HTN elevated BP on ASA and IV dilt 4. HLD on lipitor 80 mg he has been taking at home,  And LDL is 192 - most likely  need Repatha, we could add zetia for now.   5. CAD per cardiac CTA 2012.    For questions or updates, please contact [place] Please consult [email or web address] for contact info under Cardiology/STEMI.   Signed, Cecilie Kicks, NP  01/06/2018 2:54 PM   Personally seen and examined. Agree with above.  62 year old with stroke, paroxysmal atrial fibrillation with rapid ventricular response, hyperlipidemia  Currently without chest pain shortness of breath.  Strokelike symptoms noted.  GEN: Well nourished, well developed, in no acute distress  HEENT: normal  Neck: no JVD, carotid bruits, or masses Cardiac: irreg normal rate; no murmurs, rubs, or gallops,no edema  Respiratory:  clear to auscultation bilaterally, normal work of breathing GI: soft, nontender, nondistended, + BS MS: no deformity or atrophy  Skin: warm and dry, no rash Neuro:  Alert and Oriented x 3, stroke noted, aphasic Psych: euthymic mood, full affect  Telemetry with atrial fibrillation mildly increased heart rate Echocardiogram performed.  Pending read.  Assessment and plan:  Paroxysmal atrial fibrillation -Currently on Eliquis, Dr. Leonie Man made change. Agree.  Understands risks of bleeding. -Heart rate currently reasonably controlled. -Transition him to p.o. diltiazem either later tonight or tomorrow.  He was taking diltiazem extended release 240 mg once a  day at home.  This seems like a reasonable dose for him. -Note he was also taking atenolol 25 mg twice a day at home as well.  For now, if he remains under good rate control with diltiazem only, continue this to allow for increased blood pressure to help with cerebral perfusion. -If he remains in atrial fibrillation over the next several weeks, we will consider cardioversion.  For now, continue with rate control.  Acute stroke - aphasia, right-sided weakness  Hyperlipidemia -Agree with addition of Zetia 10 mg to atorvastatin 80 mg daily.  Unsure if he was taking  his atorvastatin when reading outpatient clinic notes.  Candee Furbish, MD

## 2018-01-06 NOTE — Procedures (Signed)
ELECTROENCEPHALOGRAM REPORT   Patient: Richard [name]       Room #: 3B35D EEG No. ID: [id number] Age: 62 y.o.        Sex: male Referring Physician: Tawanna Solo Report Date:  01/06/2018        Interpreting Physician: Alexis Goodell  History: Marcoantonio Legault is an 62 y.o. male with recent infarct  Medications:  Eliquis, ASA, Lipitor  Conditions of Recording:  This is a 21 channel routine scalp EEG performed with bipolar and monopolar montages arranged in accordance to the international 10/20 system of electrode placement. One channel was dedicated to EKG recording.  The patient is in the awake state.  Description:  The waking background activity consists of a low voltage, symmetrical, fairly well organized, 9 Hz alpha activity, seen from the parieto-occipital and posterior temporal regions.  Low voltage fast activity, poorly organized, is seen anteriorly and is at times superimposed on more posterior regions.  A mixture of theta and alpha rhythms are seen from the central and temporal regions. The patient does not drowse or sleep. No epileptiform activity is noted.   Hyperventilation and intermittent photic stimulation were not performed.   IMPRESSION: This is a normal awake electroencephalogram. There are no focal lateralizing or epileptiform features.  Comment:  An EEG with the patient sleep deprived to elicit drowse and light sleep may be desirable to further elicit a possible seizure disorder.     Alexis Goodell, MD Neurology [phone] 01/06/2018, 2:30 PM

## 2018-01-06 NOTE — Progress Notes (Signed)
ANTICOAGULATION CONSULT NOTE - Initial Consult  Pharmacy Consult for Lovenox Indication: atrial fibrillation  Allergies  Allergen Reactions  . Penicillins Other (See Comments)    Siblings are allergic Has patient had a PCN reaction causing immediate rash, facial/tongue/throat swelling, SOB or lightheadedness with hypotension: Unknown Has patient had a PCN reaction causing severe rash involving mucus membranes or skin necrosis: Unknown Has patient had a PCN reaction that required hospitalization: Unknown Has patient had a PCN reaction occurring within the last 10 years: Unknown If all of the above answers are "NO", then may proceed with Cephalosporin use.    Patient Measurements: Height: 6' (182.9 cm) Weight: 210 lb 5.1 oz (95.4 kg) IBW/kg (Calculated) : 77.6  Vital Signs: Temp: 98.3 F (36.8 C) (07/21 2121) Temp Source: Oral (07/21 2121) BP: 128/85 (07/22 0045) Pulse Rate: 92 (07/22 0045)  Labs: Recent Labs    01/05/18 2028 01/05/18 2036  HGB 16.4 16.3  HCT 48.4 48.0  PLT 166  --   APTT 28  --   LABPROT 12.9  --   INR 0.98  --   CREATININE 1.26* 1.10    Estimated Creatinine Clearance: 82.3 mL/min (by C-G formula based on SCr of 1.1 mg/dL).   Medical History: Past Medical History:  Diagnosis Date  . Alcoholism in remission ([place]) 08/25/2011  . Allergy    seasonal  . CAD (coronary artery disease) 08/25/2011  . Depression   . Hyperlipidemia   . Hypertension   . Sleep apnea    did use cpap about 10 years ago no use now    Medications:  No current facility-administered medications on file prior to encounter.    Current Outpatient Medications on File Prior to Encounter  Medication Sig Dispense Refill  . aspirin EC 325 MG tablet Take 325 mg by mouth once.    Marland Kitchen atenolol (TENORMIN) 25 MG tablet Take 1 tablet (25 mg total) by mouth 2 (two) times daily. 180 tablet 3  . atorvastatin (LIPITOR) 80 MG tablet Take 1 tablet (80 mg total) by mouth daily. (Patient taking  differently: Take 80 mg by mouth at bedtime. ) 90 tablet 3  . diltiazem (DILT-XR) 240 MG 24 hr capsule TAKE 1 CAPSULE (240 MG TOTAL) BY MOUTH DAILY. (Patient taking differently: Take 240 mg by mouth daily. ) 90 capsule 3  . montelukast (SINGULAIR) 10 MG tablet Take 1 tablet (10 mg total) by mouth daily. 90 tablet 3  . triamcinolone (NASACORT AQ) 55 MCG/ACT AERO nasal inhaler Place 2 sprays into the nose daily. (Patient taking differently: Place 2 sprays into the nose daily as needed (congestion). ) 1 Inhaler 12     Assessment: 62 y.o. male with CVA and new onset Afib for Lovenox Goal of Therapy:  Therapeutic anticoagulation with Lovenox Monitor platelets by anticoagulation protocol: Yes   Plan:  Lovenox 95 mg SQ q12h  Caryl Pina 01/06/2018,12:52 AM

## 2018-01-06 NOTE — Progress Notes (Signed)
MRI positive for a small number scattered left MCA territory acute ischemic infarctions. Minimal associated petechial hemorrhage. Risk of recurrent embolic stroke is felt to be significantly higher than risk of hemorrhagic transformation given small sizes of the scattered infarctions. Therapeutic Lovenox followed by transition to Eliquis in 3-7 days is recommended for stroke prophylaxis. Discussed with primary team.   Primary team also considering cardioversion for new onset a-fib.   Electronically signed: Dr. Kerney Elbe

## 2018-01-06 NOTE — Progress Notes (Signed)
EEG completed; results pending.    

## 2018-01-06 NOTE — Discharge Instructions (Signed)

## 2018-01-06 NOTE — ED Notes (Signed)
Dr Tyrone Nine notified of hr elevation on arrival back from MRI.  Restarted Diltiazem drip at this time.  Family at the bedside.  Encouraged to call for assistance as needed.

## 2018-01-06 NOTE — ED Notes (Signed)
Report taken from night shift RN - care assumed at this time

## 2018-01-06 NOTE — ED Notes (Signed)
. ED TO INPATIENT HANDOFF REPORT  Name/Age/Gender Richard [name] 62 y.o. male  Code Status   Home/SNF/Other HOME  Chief Complaint CODE STROKE   Level of Care/Admitting Diagnosis ED Disposition    ED Disposition Condition [name] [[id number]]  Level of Care: Stepdown [14]  Diagnosis: Stroke [facility]) [[id number]]  Admitting Physician: Vilma Prader [[id number]]  Attending Physician: Vilma Prader [[id number]]  Estimated length of stay: past midnight tomorrow  Certification:: I certify this patient will need inpatient services for at least 2 midnights  PT Class (Do Not Modify): Inpatient [101]  PT Acc Code (Do Not Modify): Private [1]       Medical History Past Medical History:  Diagnosis Date  . Alcoholism in remission ([place]) 08/25/2011  . Allergy    seasonal  . CAD (coronary artery disease) 08/25/2011  . Depression   . Hyperlipidemia   . Hypertension   . Sleep apnea    did use cpap about 10 years ago no use now    Allergies Allergies  Allergen Reactions  . Penicillins Other (See Comments)    Siblings are allergic Has patient had a PCN reaction causing immediate rash, facial/tongue/throat swelling, SOB or lightheadedness with hypotension: Unknown Has patient had a PCN reaction causing severe rash involving mucus membranes or skin necrosis: Unknown Has patient had a PCN reaction that required hospitalization: Unknown Has patient had a PCN reaction occurring within the last 10 years: Unknown If all of the above answers are "NO", then may proceed with Cephalosporin use.    IV Location/Drains/Wounds Patient Lines/Drains/Airways Status   Active Line/Drains/Airways    Name:   Placement date:   Placement time:   Site:   Days:   Peripheral IV 01/05/18 Left Antecubital   01/05/18    2000    Antecubital   1   Peripheral IV 01/06/18 Left Hand   01/06/18    0000    Hand   less than 1          Labs/Imaging Results for  orders placed or performed during the hospital encounter of 01/05/18 (from the past 48 hour(s))  Ethanol     Status: None   Collection Time: 01/05/18  8:28 PM  Result Value Ref Range   Alcohol, Ethyl (B) <10 <10 mg/dL    Comment: (NOTE) Lowest detectable limit for serum alcohol is 10 mg/dL. For medical purposes only. Performed at [facility], [place] [address] [place], [place] [address]   Protime-INR     Status: None   Collection Time: 01/05/18  8:28 PM  Result Value Ref Range   Prothrombin Time 12.9 11.4 - 15.2 seconds   INR 0.98     Comment: Performed at [facility], [place] [address], [place], [place] [address]  APTT     Status: None   Collection Time: 01/05/18  8:28 PM  Result Value Ref Range   aPTT 28 24 - 36 seconds    Comment: Performed at [facility] [address], [place] [address]  CBC     Status: Abnormal   Collection Time: 01/05/18  8:28 PM  Result Value Ref Range   WBC 12.5 (H) 4.0 - 10.5 K/uL   RBC 5.33 4.22 - 5.81 MIL/uL   Hemoglobin 16.4 13.0 - 17.0 g/dL   HCT 48.4 39.0 - 52.0 %   MCV 90.8 78.0 - 100.0 fL   MCH 30.8 26.0 - 34.0  pg   MCHC 33.9 30.0 - 36.0 g/dL   RDW 12.7 11.5 - 15.5 %   Platelets 166 150 - 400 K/uL    Comment: Performed at [facility], [place] [address] [place], [place] [address]  Differential     Status: Abnormal   Collection Time: 01/05/18  8:28 PM  Result Value Ref Range   Neutrophils Relative % 83 %   Neutro Abs 10.2 (H) 1.7 - 7.7 K/uL   Lymphocytes Relative 10 %   Lymphs Abs 1.3 0.7 - 4.0 K/uL   Monocytes Relative 7 %   Monocytes Absolute 0.9 0.1 - 1.0 K/uL   Eosinophils Relative 0 %   Eosinophils Absolute 0.0 0.0 - 0.7 K/uL   Basophils Relative 0 %   Basophils Absolute 0.0 0.0 - 0.1 K/uL   Immature Granulocytes 0 %   Abs Immature Granulocytes 0.0 0.0 - 0.1 K/uL    Comment: Performed at [facility] [address] [place], [place] [address]  Comprehensive metabolic panel     Status:  Abnormal   Collection Time: 01/05/18  8:28 PM  Result Value Ref Range   Sodium 143 135 - 145 mmol/L   Potassium 3.7 3.5 - 5.1 mmol/L   Chloride 110 98 - 111 mmol/L    Comment: Please note change in reference range.   CO2 18 (L) 22 - 32 mmol/L   Glucose, Bld 123 (H) 70 - 99 mg/dL    Comment: Please note change in reference range.   BUN 15 8 - 23 mg/dL    Comment: Please note change in reference range.   Creatinine, Ser 1.26 (H) 0.61 - 1.24 mg/dL   Calcium 9.2 8.9 - 10.3 mg/dL   Total Protein 7.0 6.5 - 8.1 g/dL   Albumin 4.1 3.5 - 5.0 g/dL   AST 31 15 - 41 U/L   ALT 31 0 - 44 U/L    Comment: Please note change in reference range.   Alkaline Phosphatase 73 38 - 126 U/L   Total Bilirubin 1.0 0.3 - 1.2 mg/dL   GFR calc non Af Amer 59 (L) >60 mL/min   GFR calc Af Amer >60 >60 mL/min    Comment: (NOTE) The eGFR has been calculated using the CKD EPI equation. This calculation has not been validated in all clinical situations. eGFR's persistently <60 mL/min signify possible Chronic Kidney Disease.    Anion gap 15 5 - 15    Comment: Performed at [facility] [address] [place], [place] [address]  Urine rapid drug screen (hosp performed)     Status: None   Collection Time: 01/05/18  8:28 PM  Result Value Ref Range   Opiates NONE DETECTED NONE DETECTED   Cocaine NONE DETECTED NONE DETECTED   Benzodiazepines NONE DETECTED NONE DETECTED   Amphetamines NONE DETECTED NONE DETECTED   Tetrahydrocannabinol NONE DETECTED NONE DETECTED   Barbiturates NONE DETECTED NONE DETECTED    Comment: (NOTE) DRUG SCREEN FOR MEDICAL PURPOSES ONLY.  IF CONFIRMATION IS NEEDED FOR ANY PURPOSE, NOTIFY LAB WITHIN 5 DAYS. LOWEST DETECTABLE LIMITS FOR URINE DRUG SCREEN Drug Class                     Cutoff (ng/mL) Amphetamine and metabolites    1000 Barbiturate and metabolites    200 Benzodiazepine                 794 Tricyclics and metabolites     300  Opiates and metabolites         300 Cocaine and metabolites        300 THC                            50 Performed at [facility], [place] [address], [place], [place] [address]   Urinalysis, Routine w reflex microscopic     Status: Abnormal   Collection Time: 01/05/18  8:28 PM  Result Value Ref Range   Color, Urine YELLOW YELLOW   APPearance CLEAR CLEAR   Specific Gravity, Urine >1.046 (H) 1.005 - 1.030   pH 6.0 5.0 - 8.0   Glucose, UA NEGATIVE NEGATIVE mg/dL   Hgb urine dipstick NEGATIVE NEGATIVE   Bilirubin Urine NEGATIVE NEGATIVE   Ketones, ur NEGATIVE NEGATIVE mg/dL   Protein, ur NEGATIVE NEGATIVE mg/dL   Nitrite NEGATIVE NEGATIVE   Leukocytes, UA NEGATIVE NEGATIVE    Comment: Performed at [facility] [address] [place], [place] [address]  I-stat troponin, ED     Status: None   Collection Time: 01/05/18  8:34 PM  Result Value Ref Range   Troponin i, poc 0.02 0.00 - 0.08 ng/mL   Comment 3            Comment: Due to the release kinetics of cTnI, a negative result within the first hours of the onset of symptoms does not rule out myocardial infarction with certainty. If myocardial infarction is still suspected, repeat the test at appropriate intervals.   I-Stat Chem 8, ED     Status: Abnormal   Collection Time: 01/05/18  8:36 PM  Result Value Ref Range   Sodium 142 135 - 145 mmol/L   Potassium 3.6 3.5 - 5.1 mmol/L   Chloride 110 98 - 111 mmol/L   BUN 17 8 - 23 mg/dL   Creatinine, Ser 1.10 0.61 - 1.24 mg/dL   Glucose, Bld 120 (H) 70 - 99 mg/dL   Calcium, Ion 1.04 (L) 1.15 - 1.40 mmol/L   TCO2 19 (L) 22 - 32 mmol/L   Hemoglobin 16.3 13.0 - 17.0 g/dL   HCT 48.0 39.0 - 52.0 %  Hemoglobin A1c     Status: None   Collection Time: 01/06/18  4:34 AM  Result Value Ref Range   Hgb A1c MFr Bld 5.5 4.8 - 5.6 %    Comment: (NOTE) Pre diabetes:          5.7%-6.4% Diabetes:              >6.4% Glycemic control for   <7.0% adults with diabetes    Mean Plasma Glucose 111.15 mg/dL    Comment:  Performed at [facility], [place] [address], [place], [place] [address]  TSH     Status: None   Collection Time: 01/06/18  4:34 AM  Result Value Ref Range   TSH 1.077 0.350 - 4.500 uIU/mL    Comment: Performed by a 3rd Generation assay with a functional sensitivity of <=0.01 uIU/mL. Performed at [facility], [place] [address], [place], [place] [address]   Lipid panel     Status: Abnormal   Collection Time: 01/06/18  4:38 AM  Result Value Ref Range   Cholesterol 250 (H) 0 - 200 mg/dL   Triglycerides 86 <150 mg/dL   HDL 41 >40 mg/dL   Total CHOL/HDL Ratio 6.1 RATIO   VLDL 17 0 - 40 mg/dL  LDL Cholesterol 192 (H) 0 - 99 mg/dL    Comment:        Total Cholesterol/HDL:CHD Risk Coronary Heart Disease Risk Table                     Men   Women  1/2 Average Risk   3.4   3.3  Average Risk       5.0   4.4  2 X Average Risk   9.6   7.1  3 X Average Risk  23.4   11.0        Use the calculated Patient Ratio above and the CHD Risk Table to determine the patient's CHD Risk.        ATP III CLASSIFICATION (LDL):  <100     mg/dL   Optimal  100-129  mg/dL   Near or Above                    Optimal  130-159  mg/dL   Borderline  160-189  mg/dL   High  >190     mg/dL   Very High Performed at [facility] [address], [place], [place] [address]    Ct Angio Head W Or Wo Contrast  Result Date: 01/05/2018 CLINICAL DATA:  Code stroke, slurred speech, RIGHT facial droop. EXAM: CT ANGIOGRAPHY HEAD AND NECK TECHNIQUE: Multidetector CT imaging of the head and neck was performed using the standard protocol during bolus administration of intravenous contrast. Multiplanar CT image reconstructions and MIPs were obtained to evaluate the vascular anatomy. Carotid stenosis measurements (when applicable) are obtained utilizing NASCET criteria, using the distal internal carotid diameter as the denominator. CONTRAST:  170m ISOVUE-370 IOPAMIDOL (ISOVUE-370) INJECTION 76% COMPARISON:  CT head  earlier today. FINDINGS: CTA NECK FINDINGS Aortic arch: Bovine trunk. Imaged portion shows no evidence of aneurysm or dissection. No significant stenosis of the major arch vessel origins. Right carotid system: No evidence of dissection, stenosis (50% or greater) or occlusion. Left carotid system: No evidence of dissection, stenosis (50% or greater) or occlusion. Vertebral arteries: LEFT dominant. No evidence of dissection, stenosis (50% or greater) or occlusion. Skeleton: Spondylosis.  No aggressive bone lesions. Mandibular tori. Other neck: No mass.  Airway midline. Upper chest: Unremarkable. Review of the MIP images confirms the above findings CTA HEAD FINDINGS Anterior circulation: No significant stenosis, proximal occlusion, aneurysm, or vascular malformation. Mild irregularity of the distal MCA branches suggesting intracranial atherosclerotic disease. Posterior circulation: No significant stenosis, proximal occlusion, aneurysm, or vascular malformation. Venous sinuses: As permitted by contrast timing, patent. Anatomic variants: None of significance. Delayed phase: Not performed. Review of the MIP images confirms the above findings IMPRESSION: No intracranial or extracranial stenosis or occlusion. Mild irregularity of the distal MCA branches suggesting intracranial atherosclerotic change. These results were called by telephone at the time of interpretation on 01/05/2018 at 8:54 pm to Dr. EKerney Elbe, who verbally acknowledged these results. Electronically Signed   By: JStaci RighterM.D.   On: 01/05/2018 20:57   Dg Shoulder Right  Result Date: 01/05/2018 CLINICAL DATA:  Shoulder pain after fall EXAM: RIGHT SHOULDER - 2+ VIEW COMPARISON:  None. FINDINGS: Moderate AC joint degenerative changes. No acute displaced fracture or malalignment. IMPRESSION: No acute osseous abnormality Electronically Signed   By: KDonavan FoilM.D.   On: 01/05/2018 23:10   Ct Angio Neck W Or Wo Contrast  Result Date:  01/05/2018 CLINICAL DATA:  Code stroke, slurred speech, RIGHT facial droop.  EXAM: CT ANGIOGRAPHY HEAD AND NECK TECHNIQUE: Multidetector CT imaging of the head and neck was performed using the standard protocol during bolus administration of intravenous contrast. Multiplanar CT image reconstructions and MIPs were obtained to evaluate the vascular anatomy. Carotid stenosis measurements (when applicable) are obtained utilizing NASCET criteria, using the distal internal carotid diameter as the denominator. CONTRAST:  198m ISOVUE-370 IOPAMIDOL (ISOVUE-370) INJECTION 76% COMPARISON:  CT head earlier today. FINDINGS: CTA NECK FINDINGS Aortic arch: Bovine trunk. Imaged portion shows no evidence of aneurysm or dissection. No significant stenosis of the major arch vessel origins. Right carotid system: No evidence of dissection, stenosis (50% or greater) or occlusion. Left carotid system: No evidence of dissection, stenosis (50% or greater) or occlusion. Vertebral arteries: LEFT dominant. No evidence of dissection, stenosis (50% or greater) or occlusion. Skeleton: Spondylosis.  No aggressive bone lesions. Mandibular tori. Other neck: No mass.  Airway midline. Upper chest: Unremarkable. Review of the MIP images confirms the above findings CTA HEAD FINDINGS Anterior circulation: No significant stenosis, proximal occlusion, aneurysm, or vascular malformation. Mild irregularity of the distal MCA branches suggesting intracranial atherosclerotic disease. Posterior circulation: No significant stenosis, proximal occlusion, aneurysm, or vascular malformation. Venous sinuses: As permitted by contrast timing, patent. Anatomic variants: None of significance. Delayed phase: Not performed. Review of the MIP images confirms the above findings IMPRESSION: No intracranial or extracranial stenosis or occlusion. Mild irregularity of the distal MCA branches suggesting intracranial atherosclerotic change. These results were called by telephone at  the time of interpretation on 01/05/2018 at 8:54 pm to Dr. EKerney Elbe, who verbally acknowledged these results. Electronically Signed   By: JStaci RighterM.D.   On: 01/05/2018 20:57   Mr Brain Wo Contrast  Result Date: 01/06/2018 CLINICAL DATA:  Initial evaluation for focal neural deficit. EXAM: MRI HEAD WITHOUT CONTRAST TECHNIQUE: Multiplanar, multiecho pulse sequences of the brain and surrounding structures were obtained without intravenous contrast. COMPARISON:  Prior study from earlier the same day. FINDINGS: Brain: Cerebral volume within normal limits. No significant cerebral white matter changes. Patchy small volume foci of diffusion abnormality seen involving the cortical gray matter of the left temporal and parietal region (series 5, image 57, 63, 67), consistent with acute ischemic infarcts. Single small focus of susceptibility artifact at the parietal region suggestive of petechial hemorrhage (series 12, image 640). No frank hemorrhagic transformation. No mass effect. No other evidence for acute or subacute ischemia. Gray-white matter differentiation otherwise maintained. No other areas of chronic infarction. No other evidence for acute or chronic intracranial hemorrhage. No mass lesion, midline shift or mass effect. No hydrocephalus. No extra-axial fluid collection. Pituitary gland normal. Vascular: Major intracranial vascular flow voids maintained Skull and upper cervical spine: Craniocervical junction normal. Upper cervical spine normal. Bone marrow signal intensity normal. No scalp soft tissue abnormality. Sinuses/Orbits: Globes and orbital soft tissues within normal limits. Scattered mucosal thickening throughout the paranasal sinuses. No mastoid effusion. Inner ear structures normal. Other: None. IMPRESSION: 1. Patchy small volume acute ischemic cortical infarcts involving the left parietotemporal region as above. Scant associated petechial hemorrhage without hemorrhagic transformation or mass  effect. 2. Otherwise normal brain MRI. Electronically Signed   By: BJeannine BogaM.D.   On: 01/06/2018 00:15   Ct Cerebral Perfusion W Contrast  Result Date: 01/05/2018 CLINICAL DATA:  Suspected LEFT MCA syndrome. EXAM: CT PERFUSION BRAIN TECHNIQUE: Multiphase CT imaging of the brain was performed following IV bolus contrast injection. Subsequent parametric perfusion maps were calculated using RAPID software. CONTRAST:  1024m  ISOVUE-370 IOPAMIDOL (ISOVUE-370) INJECTION 76% COMPARISON:  CTA head neck performed earlier. FINDINGS: CT Brain Perfusion Findings: CBF (<30%) Volume: 38m Perfusion (Tmax>6.0s) volume: 034mMismatch Volume: 54m87mSPECTS on noncontrast CT Head: 10 at 8:40 p.m. today. Infarct Core: 0 mL Infarction Location:Not applicable. There is a small wedge defect, 15 mL volume, T-max greater than 4 seconds, LEFT parietal cortex which does not correlate with the clinical picture and nor with a definite vascular occlusion. This was discussed with the ordering provider, and it is felt there were no findings on the CTA to explain this appearance. IMPRESSION: CT brain perfusion is negative.  See discussion above. Electronically Signed   By: JohStaci RighterD.   On: 01/05/2018 21:21   Dg Chest Port 1 View  Result Date: 01/05/2018 CLINICAL DATA:  History of fall to the right side EXAM: PORTABLE CHEST 1 VIEW COMPARISON:  10/18/2016, CT 09/21/2010 FINDINGS: Mild elevation of the right diaphragm. No acute airspace disease or effusion. Normal heart size. No pneumothorax. 8 mm metallic density projecting over the right ninth rib medially, on limited cardiac CT from 09/21/2010, this appears to be within the anterior chest wall musculature. IMPRESSION: No active disease. 8 mm metallic opacity over the right lower chest wall. Electronically Signed   By: KimDonavan FoilD.   On: 01/05/2018 23:07   Dg Hand Complete Right  Result Date: 01/05/2018 CLINICAL DATA:  Right hand pain history of fall EXAM: RIGHT  HAND - COMPLETE 3+ VIEW COMPARISON:  None. FINDINGS: No acute displaced fracture or malalignment. Moderate arthritis at the second MCP joint. Minimal degenerative changes at the third and fifth MCP joints. No radiopaque foreign body. IMPRESSION: No acute osseous abnormality Electronically Signed   By: KimDonavan FoilD.   On: 01/05/2018 23:09   Ct Head Code Stroke Wo Contrast  Result Date: 01/05/2018 CLINICAL DATA:  Code stroke. Last seen normal at 1500 hours, RIGHT facial droop with slurred speech. EXAM: CT HEAD WITHOUT CONTRAST TECHNIQUE: Contiguous axial images were obtained from the base of the skull through the vertex without intravenous contrast. COMPARISON:  None. FINDINGS: Brain: No evidence of acute infarction, hemorrhage, hydrocephalus, extra-axial collection or mass lesion/mass effect. Normal for age cerebral volume. No white matter disease. Vascular: No hyperdense vessel or unexpected calcification. Skull: Normal. Negative for fracture or focal lesion. Sinuses/Orbits: No acute finding. Other: None. ASPECTS ([facility] Program Early CT Score) - Ganglionic level infarction (caudate, lentiform nuclei, internal capsule, insula, M1-M3 cortex): 7 - Supraganglionic infarction (M4-M6 cortex): 3 Total score (0-10 with 10 being normal): 10 IMPRESSION: 1. Negative exam 2. ASPECTS is 10. These results were communicated to Dr. LinCheral Marker 8:40 [email or web address] text page via the [facility]aging system. Electronically Signed   By: JohStaci RighterD.   On: 01/05/2018 20:42    Pending Labs Unresulted Labs (From admission, onward)   Start     Ordered   01/07/18 0500  CBC  Every 72 hours,   R     01/06/18 0054   01/06/18 0500  HIV antibody (Routine Testing)  Tomorrow morning,   R     01/06/18 0019      Vitals/Pain Today's Vitals   01/06/18 0615 01/06/18 0630 01/06/18 0645 01/06/18 0700  BP: 119/78 124/88 (!) 123/96 (!) 145/93  Pulse: 91 76 72 85  Resp: '14 17 12 16  ' Temp:      TempSrc:      SpO2:  98% 96% 98% 98%  Weight:  Height:      PainSc:        Isolation Precautions No active isolations  Medications Medications  diltiazem (CARDIZEM) 1 mg/mL load via infusion 20 mg (20 mg Intravenous Bolus from Bag 01/05/18 2230)    And  diltiazem (CARDIZEM) 100 mg in dextrose 5% 173m (1 mg/mL) infusion (5 mg/hr Intravenous Restarted 01/05/18 2352)   stroke: mapping our early stages of recovery book (has no administration in time range)  0.9 %  sodium chloride infusion ( Intravenous New Bag/Given 01/06/18 0417)  acetaminophen (TYLENOL) tablet 650 mg (has no administration in time range)    Or  acetaminophen (TYLENOL) solution 650 mg (has no administration in time range)    Or  acetaminophen (TYLENOL) suppository 650 mg (has no administration in time range)  aspirin suppository 300 mg (has no administration in time range)    Or  aspirin tablet 325 mg (has no administration in time range)  atorvastatin (LIPITOR) tablet 80 mg (has no administration in time range)  enoxaparin (LOVENOX) injection 95 mg (95 mg Subcutaneous Given 01/06/18 0147)  iopamidol (ISOVUE-370) 76 % injection 100 mL (100 mLs Intravenous Contrast Given 01/05/18 2038)  aspirin suppository 600 mg (600 mg Rectal Given 01/05/18 2132)    Mobility Up with assistance

## 2018-01-06 NOTE — Progress Notes (Signed)
ANTICOAGULATION CONSULT NOTE - Initial Consult  Pharmacy Consult for Apixaban Indication: atrial fibrillation  Allergies  Allergen Reactions  . Penicillins Other (See Comments)    Siblings are allergic Has patient had a PCN reaction causing immediate rash, facial/tongue/throat swelling, SOB or lightheadedness with hypotension: Unknown Has patient had a PCN reaction causing severe rash involving mucus membranes or skin necrosis: Unknown Has patient had a PCN reaction that required hospitalization: Unknown Has patient had a PCN reaction occurring within the last 10 years: Unknown If all of the above answers are "NO", then may proceed with Cephalosporin use.    Patient Measurements: Height: 6' (182.9 cm) Weight: 210 lb 5.1 oz (95.4 kg) IBW/kg (Calculated) : 77.6  Vital Signs: Temp: 98.7 F (37.1 C) (07/22 0827) Temp Source: Oral (07/22 0827) BP: 127/104 (07/22 0827) Pulse Rate: 72 (07/22 0827)  Labs: Recent Labs    01/05/18 2028 01/05/18 2036  HGB 16.4 16.3  HCT 48.4 48.0  PLT 166  --   APTT 28  --   LABPROT 12.9  --   INR 0.98  --   CREATININE 1.26* 1.10    Estimated Creatinine Clearance: 82.3 mL/min (by C-G formula based on SCr of 1.1 mg/dL).   Medical History: Past Medical History:  Diagnosis Date  . Alcoholism in remission ([place]) 08/25/2011  . Allergy    seasonal  . CAD (coronary artery disease) 08/25/2011  . Depression   . Hyperlipidemia   . Hypertension   . Sleep apnea    did use cpap about 10 years ago no use now    Medications:  No current facility-administered medications on file prior to encounter.    Current Outpatient Medications on File Prior to Encounter  Medication Sig Dispense Refill  . aspirin EC 325 MG tablet Take 325 mg by mouth once.    [place] Kitchen atenolol (TENORMIN) 25 MG tablet Take 1 tablet (25 mg total) by mouth 2 (two) times daily. 180 tablet 3  . atorvastatin (LIPITOR) 80 MG tablet Take 1 tablet (80 mg total) by mouth daily. (Patient taking  differently: Take 80 mg by mouth at bedtime. ) 90 tablet 3  . diltiazem (DILT-XR) 240 MG 24 hr capsule TAKE 1 CAPSULE (240 MG TOTAL) BY MOUTH DAILY. (Patient taking differently: Take 240 mg by mouth daily. ) 90 capsule 3  . montelukast (SINGULAIR) 10 MG tablet Take 1 tablet (10 mg total) by mouth daily. 90 tablet 3  . triamcinolone (NASACORT AQ) 55 MCG/ACT AERO nasal inhaler Place 2 sprays into the nose daily. (Patient taking differently: Place 2 sprays into the nose daily as needed (congestion). ) 1 Inhaler 12     Assessment: 62 y.o. male with CVA and new onset Afib. Initially started on heparin IV and then changed to lovenox. Now to be initiated on apixaban per MD. Last dose of lovenox~0200. Will start apixaban at ~1200 today per protocol. No current bleeding noted    Plan:  Apixaban 5mg  PO BID  Jannah Guardiola A. Levada Dy, PharmD, [place] Pager: [phone] Please utilize Amion for appropriate phone number to reach the unit pharmacist ([place])    01/06/2018,9:23 AM

## 2018-01-06 NOTE — Plan of Care (Signed)
progressing 

## 2018-01-07 ENCOUNTER — Telehealth: Payer: Self-pay | Admitting: Cardiology

## 2018-01-07 DIAGNOSIS — I639 Cerebral infarction, unspecified: Secondary | ICD-10-CM

## 2018-01-07 LAB — CBC
HEMATOCRIT: 46.7 % (ref 39.0–52.0)
Hemoglobin: 15.7 g/dL (ref 13.0–17.0)
MCH: 30.2 pg (ref 26.0–34.0)
MCHC: 33.6 g/dL (ref 30.0–36.0)
MCV: 89.8 fL (ref 78.0–100.0)
PLATELETS: 161 10*3/uL (ref 150–400)
RBC: 5.2 MIL/uL (ref 4.22–5.81)
RDW: 12.6 % (ref 11.5–15.5)
WBC: 9 10*3/uL (ref 4.0–10.5)

## 2018-01-07 MED ORDER — APIXABAN 5 MG PO TABS: 5.0000 mg | ORAL_TABLET | Freq: Two times a day (BID) | ORAL | 0 refills | Status: DC

## 2018-01-07 MED ORDER — LISINOPRIL 5 MG PO TABS: 5.0000 mg | ORAL_TABLET | Freq: Every day | ORAL | 0 refills | Status: DC

## 2018-01-07 MED ORDER — ATENOLOL 25 MG PO TABS
25.0000 mg | ORAL_TABLET | Freq: Two times a day (BID) | ORAL | Status: DC
  Administered 2018-01-07: 25 mg via ORAL
  Filled 2018-01-07: qty 1

## 2018-01-07 MED ORDER — EZETIMIBE 10 MG PO TABS: 10.0000 mg | ORAL_TABLET | Freq: Every day | ORAL | 0 refills | Status: DC

## 2018-01-07 MED ORDER — METOPROLOL SUCCINATE ER 100 MG PO TB24: 100.0000 mg | ORAL_TABLET | Freq: Every day | ORAL | 0 refills | Status: DC

## 2018-01-07 MED ORDER — ASPIRIN 325 MG PO TABS: 325.0000 mg | ORAL_TABLET | Freq: Every day | ORAL | 0 refills | Status: DC

## 2018-01-07 MED ORDER — LISINOPRIL 5 MG PO TABS
5.0000 mg | ORAL_TABLET | Freq: Every day | ORAL | Status: DC
  Administered 2018-01-07: 5 mg via ORAL
  Filled 2018-01-07: qty 1

## 2018-01-07 MED ORDER — EZETIMIBE 10 MG PO TABS
10.0000 mg | ORAL_TABLET | Freq: Every day | ORAL | Status: DC
  Administered 2018-01-07: 10 mg via ORAL
  Filled 2018-01-07: qty 1

## 2018-01-07 MED ORDER — METOPROLOL SUCCINATE ER 100 MG PO TB24
100.0000 mg | ORAL_TABLET | Freq: Every day | ORAL | Status: DC
  Administered 2018-01-07: 100 mg via ORAL
  Filled 2018-01-07: qty 1

## 2018-01-07 NOTE — Telephone Encounter (Signed)
The pt is being discharged from the hospital today. Will call tomorrow. 

## 2018-01-07 NOTE — Progress Notes (Signed)
Discharge instructions reviewed with patient/family. All questions answered at this time. RNs given. Transport home by family.   Ave Filter, RN

## 2018-01-07 NOTE — Discharge Summary (Addendum)
Physician Discharge Summary  Dominique Calvey HQP:[id number] DOB: Dec 08, 1955 DOA: 01/05/2018  PCP: Biagio Borg, MD  Admit date: 01/05/2018 Discharge date: 01/07/2018  Admitted From: Home Disposition:  Home  Discharge Condition:Stable CODE STATUS:FULL Diet recommendation: Heart Healthy  Brief/Interim Summary: Patient is a 62 year old man with past medical history  hypertension, hyperlipidemia, sleep apnea who presents to the emergency department with dysarthria and right upper extremity weakness.He was also found to be in A. fib with RVR on presentation.MRI brain was performed which revealed small volume acute ischemic cortical infarct involving the left parietal temporal region, no hemorrhage transformation or mass effect noted. Neurology was consulted.  Cardiology also consulted  for new onset A. fib with RVR.  His echocardiogram also showed reduced ejection fraction to 40 to 45%.  Medications adjusted.  Weakness on his right upper extremity has been improving.  Patient seen by physical therapy with no specific recommendation.  He is stable for discharge home today.  Follow-up with neurology and cardiology on discharge.  Following problems were addressed during his hospitalization:  Left parietotemporal stroke : Presented with expressive aphasia and right upper extremity weakness.  Was found to be in A. fib with RVR on presentation.  PT/OT/speech therapy evaluation pending.  Neurology was following.  On aspirin and statin.Added Zetia. Hemoglobin A1c 5.5.  Lipid panel showed LDL of 192. MRI finding as above.  CTA head and neck showed no intracranial or extracranial stenosis or occlusion.  Heart failure with reduced ejection fraction: Clinically he is not in heart failure.  Echo cardiogram showed ejection fraction of 40 to 45%, moderate diffuse hypokinesis.  Cardiology was following.  Started on metoprolol and lisinopril.  He will follow-up with cardiology as an outpatient.  A. fib with RVR: New  onset.  No history of A. fib in the past.  Started on Cardizem drip.  Currently heart rate is controlled.  Started on anticoagulation with Eliquis.  Continue metoprolol succinate.  Hypertension: Medications changed.  Stopped diltiazem and atenolol.  History of OSA: Not on CPAP.   Discharge Diagnoses:  Principal Problem:   Stroke [facility]) Active Problems:   Hyperlipidemia   Hypertension   New onset a-fib [facility])    Discharge Instructions  Discharge Instructions    Ambulatory referral to Neurology   Complete by:  As directed    An appointment is requested in approximately: 4 weeks   Diet - low sodium heart healthy   Complete by:  As directed    Discharge instructions   Complete by:  As directed    1) Take prescribed medication as instructed. 2) Follow up with cardiology and neurology as an outpatient.  Name and number of the provider has been attached. 3) Follow up with your PCP in a week.   Increase activity slowly   Complete by:  As directed      Allergies as of 01/07/2018      Reactions   Penicillins Other (See Comments)   Siblings are allergic Has patient had a PCN reaction causing immediate rash, facial/tongue/throat swelling, SOB or lightheadedness with hypotension: Unknown Has patient had a PCN reaction causing severe rash involving mucus membranes or skin necrosis: Unknown Has patient had a PCN reaction that required hospitalization: Unknown Has patient had a PCN reaction occurring within the last 10 years: Unknown If all of the above answers are "NO", then may proceed with Cephalosporin use.      Medication List    STOP taking these medications   aspirin EC 325 MG tablet  Replaced by:  aspirin 325 MG tablet   atenolol 25 MG tablet Commonly known as:  TENORMIN   diltiazem 240 MG 24 hr capsule Commonly known as:  DILT-XR     TAKE these medications   apixaban 5 MG Tabs tablet Commonly known as:  ELIQUIS Take 1 tablet (5 mg total) by mouth 2 (two) times  daily.   aspirin 325 MG tablet Take 1 tablet (325 mg total) by mouth daily. Start taking on:  01/08/2018 Replaces:  aspirin EC 325 MG tablet   atorvastatin 80 MG tablet Commonly known as:  LIPITOR Take 1 tablet (80 mg total) by mouth daily. What changed:  when to take this   ezetimibe 10 MG tablet Commonly known as:  ZETIA Take 1 tablet (10 mg total) by mouth daily. Start taking on:  01/08/2018   lisinopril 5 MG tablet Commonly known as:  PRINIVIL,ZESTRIL Take 1 tablet (5 mg total) by mouth daily. Start taking on:  01/08/2018   metoprolol succinate 100 MG 24 hr tablet Commonly known as:  TOPROL-XL Take 1 tablet (100 mg total) by mouth daily. Take with or immediately following a meal. Start taking on:  01/08/2018   montelukast 10 MG tablet Commonly known as:  SINGULAIR Take 1 tablet (10 mg total) by mouth daily.   triamcinolone 55 MCG/ACT Aero nasal inhaler Commonly known as:  NASACORT AQ Place 2 sprays into the nose daily. What changed:    when to take this  reasons to take this      Follow-up Information    Burtis Junes, NP Follow up on 01/20/2018.   Specialties:  Nurse Practitioner, Interventional Cardiology, Cardiology, Radiology Why:  10:00 am for TCM for hospital follow up Contact information: [address] [place] [address] [phone]          Allergies  Allergen Reactions  . Penicillins Other (See Comments)    Siblings are allergic Has patient had a PCN reaction causing immediate rash, facial/tongue/throat swelling, SOB or lightheadedness with hypotension: Unknown Has patient had a PCN reaction causing severe rash involving mucus membranes or skin necrosis: Unknown Has patient had a PCN reaction that required hospitalization: Unknown Has patient had a PCN reaction occurring within the last 10 years: Unknown If all of the above answers are "NO", then may proceed with Cephalosporin use.    Consultations: Cardiology,  neurology  Procedures/Studies: Ct Angio Head W Or Wo Contrast  Result Date: 01/05/2018 CLINICAL DATA:  Code stroke, slurred speech, RIGHT facial droop. EXAM: CT ANGIOGRAPHY HEAD AND NECK TECHNIQUE: Multidetector CT imaging of the head and neck was performed using the standard protocol during bolus administration of intravenous contrast. Multiplanar CT image reconstructions and MIPs were obtained to evaluate the vascular anatomy. Carotid stenosis measurements (when applicable) are obtained utilizing NASCET criteria, using the distal internal carotid diameter as the denominator. CONTRAST:  135mL ISOVUE-370 IOPAMIDOL (ISOVUE-370) INJECTION 76% COMPARISON:  CT head earlier today. FINDINGS: CTA NECK FINDINGS Aortic arch: Bovine trunk. Imaged portion shows no evidence of aneurysm or dissection. No significant stenosis of the major arch vessel origins. Right carotid system: No evidence of dissection, stenosis (50% or greater) or occlusion. Left carotid system: No evidence of dissection, stenosis (50% or greater) or occlusion. Vertebral arteries: LEFT dominant. No evidence of dissection, stenosis (50% or greater) or occlusion. Skeleton: Spondylosis.  No aggressive bone lesions. Mandibular tori. Other neck: No mass.  Airway midline. Upper chest: Unremarkable. Review of the MIP images confirms the above findings CTA  HEAD FINDINGS Anterior circulation: No significant stenosis, proximal occlusion, aneurysm, or vascular malformation. Mild irregularity of the distal MCA branches suggesting intracranial atherosclerotic disease. Posterior circulation: No significant stenosis, proximal occlusion, aneurysm, or vascular malformation. Venous sinuses: As permitted by contrast timing, patent. Anatomic variants: None of significance. Delayed phase: Not performed. Review of the MIP images confirms the above findings IMPRESSION: No intracranial or extracranial stenosis or occlusion. Mild irregularity of the distal MCA branches  suggesting intracranial atherosclerotic change. These results were called by telephone at the time of interpretation on 01/05/2018 at 8:54 pm to Dr. Kerney Elbe , who verbally acknowledged these results. Electronically Signed   By: Staci Righter M.D.   On: 01/05/2018 20:57   Dg Shoulder Right  Result Date: 01/05/2018 CLINICAL DATA:  Shoulder pain after fall EXAM: RIGHT SHOULDER - 2+ VIEW COMPARISON:  None. FINDINGS: Moderate AC joint degenerative changes. No acute displaced fracture or malalignment. IMPRESSION: No acute osseous abnormality Electronically Signed   By: Donavan Foil M.D.   On: 01/05/2018 23:10   Ct Angio Neck W Or Wo Contrast  Result Date: 01/05/2018 CLINICAL DATA:  Code stroke, slurred speech, RIGHT facial droop. EXAM: CT ANGIOGRAPHY HEAD AND NECK TECHNIQUE: Multidetector CT imaging of the head and neck was performed using the standard protocol during bolus administration of intravenous contrast. Multiplanar CT image reconstructions and MIPs were obtained to evaluate the vascular anatomy. Carotid stenosis measurements (when applicable) are obtained utilizing NASCET criteria, using the distal internal carotid diameter as the denominator. CONTRAST:  142mL ISOVUE-370 IOPAMIDOL (ISOVUE-370) INJECTION 76% COMPARISON:  CT head earlier today. FINDINGS: CTA NECK FINDINGS Aortic arch: Bovine trunk. Imaged portion shows no evidence of aneurysm or dissection. No significant stenosis of the major arch vessel origins. Right carotid system: No evidence of dissection, stenosis (50% or greater) or occlusion. Left carotid system: No evidence of dissection, stenosis (50% or greater) or occlusion. Vertebral arteries: LEFT dominant. No evidence of dissection, stenosis (50% or greater) or occlusion. Skeleton: Spondylosis.  No aggressive bone lesions. Mandibular tori. Other neck: No mass.  Airway midline. Upper chest: Unremarkable. Review of the MIP images confirms the above findings CTA HEAD FINDINGS Anterior  circulation: No significant stenosis, proximal occlusion, aneurysm, or vascular malformation. Mild irregularity of the distal MCA branches suggesting intracranial atherosclerotic disease. Posterior circulation: No significant stenosis, proximal occlusion, aneurysm, or vascular malformation. Venous sinuses: As permitted by contrast timing, patent. Anatomic variants: None of significance. Delayed phase: Not performed. Review of the MIP images confirms the above findings IMPRESSION: No intracranial or extracranial stenosis or occlusion. Mild irregularity of the distal MCA branches suggesting intracranial atherosclerotic change. These results were called by telephone at the time of interpretation on 01/05/2018 at 8:54 pm to Dr. Kerney Elbe , who verbally acknowledged these results. Electronically Signed   By: Staci Righter M.D.   On: 01/05/2018 20:57   Mr Brain Wo Contrast  Result Date: 01/06/2018 CLINICAL DATA:  Initial evaluation for focal neural deficit. EXAM: MRI HEAD WITHOUT CONTRAST TECHNIQUE: Multiplanar, multiecho pulse sequences of the brain and surrounding structures were obtained without intravenous contrast. COMPARISON:  Prior study from earlier the same day. FINDINGS: Brain: Cerebral volume within normal limits. No significant cerebral white matter changes. Patchy small volume foci of diffusion abnormality seen involving the cortical gray matter of the left temporal and parietal region (series 5, image 57, 63, 67), consistent with acute ischemic infarcts. Single small focus of susceptibility artifact at the parietal region suggestive of petechial hemorrhage (series 12, image 640).  No frank hemorrhagic transformation. No mass effect. No other evidence for acute or subacute ischemia. Gray-white matter differentiation otherwise maintained. No other areas of chronic infarction. No other evidence for acute or chronic intracranial hemorrhage. No mass lesion, midline shift or mass effect. No hydrocephalus. No  extra-axial fluid collection. Pituitary gland normal. Vascular: Major intracranial vascular flow voids maintained Skull and upper cervical spine: Craniocervical junction normal. Upper cervical spine normal. Bone marrow signal intensity normal. No scalp soft tissue abnormality. Sinuses/Orbits: Globes and orbital soft tissues within normal limits. Scattered mucosal thickening throughout the paranasal sinuses. No mastoid effusion. Inner ear structures normal. Other: None. IMPRESSION: 1. Patchy small volume acute ischemic cortical infarcts involving the left parietotemporal region as above. Scant associated petechial hemorrhage without hemorrhagic transformation or mass effect. 2. Otherwise normal brain MRI. Electronically Signed   By: Jeannine Boga M.D.   On: 01/06/2018 00:15   Ct Cerebral Perfusion W Contrast  Result Date: 01/05/2018 CLINICAL DATA:  Suspected LEFT MCA syndrome. EXAM: CT PERFUSION BRAIN TECHNIQUE: Multiphase CT imaging of the brain was performed following IV bolus contrast injection. Subsequent parametric perfusion maps were calculated using RAPID software. CONTRAST:  160mL ISOVUE-370 IOPAMIDOL (ISOVUE-370) INJECTION 76% COMPARISON:  CTA head neck performed earlier. FINDINGS: CT Brain Perfusion Findings: CBF (<30%) Volume: 1mL Perfusion (Tmax>6.0s) volume: 48mL Mismatch Volume: 72mL ASPECTS on noncontrast CT Head: 10 at 8:40 p.m. today. Infarct Core: 0 mL Infarction Location:Not applicable. There is a small wedge defect, 15 mL volume, T-max greater than 4 seconds, LEFT parietal cortex which does not correlate with the clinical picture and nor with a definite vascular occlusion. This was discussed with the ordering provider, and it is felt there were no findings on the CTA to explain this appearance. IMPRESSION: CT brain perfusion is negative.  See discussion above. Electronically Signed   By: Staci Righter M.D.   On: 01/05/2018 21:21   Dg Chest Port 1 View  Result Date: 01/05/2018 CLINICAL  DATA:  History of fall to the right side EXAM: PORTABLE CHEST 1 VIEW COMPARISON:  10/18/2016, CT 09/21/2010 FINDINGS: Mild elevation of the right diaphragm. No acute airspace disease or effusion. Normal heart size. No pneumothorax. 8 mm metallic density projecting over the right ninth rib medially, on limited cardiac CT from 09/21/2010, this appears to be within the anterior chest wall musculature. IMPRESSION: No active disease. 8 mm metallic opacity over the right lower chest wall. Electronically Signed   By: Donavan Foil M.D.   On: 01/05/2018 23:07   Dg Hand Complete Right  Result Date: 01/05/2018 CLINICAL DATA:  Right hand pain history of fall EXAM: RIGHT HAND - COMPLETE 3+ VIEW COMPARISON:  None. FINDINGS: No acute displaced fracture or malalignment. Moderate arthritis at the second MCP joint. Minimal degenerative changes at the third and fifth MCP joints. No radiopaque foreign body. IMPRESSION: No acute osseous abnormality Electronically Signed   By: Donavan Foil M.D.   On: 01/05/2018 23:09   Ct Head Code Stroke Wo Contrast  Result Date: 01/05/2018 CLINICAL DATA:  Code stroke. Last seen normal at 1500 hours, RIGHT facial droop with slurred speech. EXAM: CT HEAD WITHOUT CONTRAST TECHNIQUE: Contiguous axial images were obtained from the base of the skull through the vertex without intravenous contrast. COMPARISON:  None. FINDINGS: Brain: No evidence of acute infarction, hemorrhage, hydrocephalus, extra-axial collection or mass lesion/mass effect. Normal for age cerebral volume. No white matter disease. Vascular: No hyperdense vessel or unexpected calcification. Skull: Normal. Negative for fracture or focal lesion. Sinuses/Orbits: No  acute finding. Other: None. ASPECTS [facility] Stroke Program Early CT Score) - Ganglionic level infarction (caudate, lentiform nuclei, internal capsule, insula, M1-M3 cortex): 7 - Supraganglionic infarction (M4-M6 cortex): 3 Total score (0-10 with 10 being normal): 10  IMPRESSION: 1. Negative exam 2. ASPECTS is 10. These results were communicated to Dr. Cheral Marker at 8:40 [place] 7/21/2019by text page via the [facility] messaging system. Electronically Signed   By: Staci Righter M.D.   On: 01/05/2018 20:42      Subjective: Patient seen and examined the pressure this morning.  Remains comfortable.  No new issues/events.  Heart rate is better controlled this morning.  Weakness on the right upper extremity has improved.  His speech is more clear today.  Stable for discharge to home.  Discharge Exam: Vitals:   01/07/18 0839 01/07/18 1029  BP: 140/88   Pulse:  68  Resp:    Temp:    SpO2:     Vitals:   01/07/18 0318 01/07/18 0800 01/07/18 0839 01/07/18 1029  BP: (!) 127/97 (!) 129/95 140/88   Pulse: 87 (!) 134  68  Resp: 13     Temp: 98.4 F (36.9 C)     TempSrc: Oral     SpO2: 93%     Weight:      Height:        General: Pt is alert, awake, not in acute distress Cardiovascular: Afib, S1/S2 +, no rubs, no gallops Respiratory: CTA bilaterally, no wheezing, no rhonchi Abdominal: Soft, NT, ND, bowel sounds + Extremities: no edema, no cyanosis    The results of significant diagnostics from this hospitalization (including imaging, microbiology, ancillary and laboratory) are listed below for reference.     Microbiology: No results found for this or any previous visit (from the past 240 hour(s)).   Labs: BNP (last 3 results) No results for input(s): BNP in the last 8760 hours. Basic Metabolic Panel: Recent Labs  Lab 01/05/18 2028 01/05/18 2036  NA 143 142  K 3.7 3.6  CL 110 110  CO2 18*  --   GLUCOSE 123* 120*  BUN 15 17  CREATININE 1.26* 1.10  CALCIUM 9.2  --    Liver Function Tests: Recent Labs  Lab 01/05/18 2028  AST 31  ALT 31  ALKPHOS 73  BILITOT 1.0  PROT 7.0  ALBUMIN 4.1   No results for input(s): LIPASE, AMYLASE in the last 168 hours. No results for input(s): AMMONIA in the last 168 hours. CBC: Recent Labs  Lab  01/05/18 2028 01/05/18 2036 01/07/18 0525  WBC 12.5*  --  9.0  NEUTROABS 10.2*  --   --   HGB 16.4 16.3 15.7  HCT 48.4 48.0 46.7  MCV 90.8  --  89.8  PLT 166  --  161   Cardiac Enzymes: No results for input(s): CKTOTAL, CKMB, CKMBINDEX, TROPONINI in the last 168 hours. BNP: Invalid input(s): POCBNP CBG: Recent Labs  Lab 01/05/18 2025  GLUCAP 118*   D-Dimer No results for input(s): DDIMER in the last 72 hours. Hgb A1c Recent Labs    01/06/18 0434  HGBA1C 5.5   Lipid Profile Recent Labs    01/06/18 0438  CHOL 250*  HDL 41  LDLCALC 192*  TRIG 86  CHOLHDL 6.1   Thyroid function studies Recent Labs    01/06/18 0434  TSH 1.077   Anemia work up No results for input(s): VITAMINB12, FOLATE, FERRITIN, TIBC, IRON, RETICCTPCT in the last 72 hours. Urinalysis    Component Value Date/Time  COLORURINE YELLOW 01/05/2018 2028   APPEARANCEUR CLEAR 01/05/2018 2028   LABSPEC >1.046 (H) 01/05/2018 2028   PHURINE 6.0 01/05/2018 2028   GLUCOSEU NEGATIVE 01/05/2018 2028   GLUCOSEU NEGATIVE 11/01/2017 1458   HGBUR NEGATIVE 01/05/2018 2028   BILIRUBINUR NEGATIVE 01/05/2018 2028   KETONESUR NEGATIVE 01/05/2018 2028   PROTEINUR NEGATIVE 01/05/2018 2028   UROBILINOGEN 0.2 11/01/2017 1458   NITRITE NEGATIVE 01/05/2018 2028   LEUKOCYTESUR NEGATIVE 01/05/2018 2028   Sepsis Labs Invalid input(s): PROCALCITONIN,  WBC,  LACTICIDVEN Microbiology No results found for this or any previous visit (from the past 240 hour(s)).  Please note: You were cared for by a hospitalist during your hospital stay. Once you are discharged, your primary care physician will handle any further medical issues. Please note that NO REFILLS for any discharge medications will be authorized once you are discharged, as it is imperative that you return to your primary care physician (or establish a relationship with a primary care physician if you do not have one) for your post hospital discharge needs so that  they can reassess your need for medications and monitor your lab values.    Time coordinating discharge: 40 minutes  SIGNED:   Shelly Coss, MD  [facility] 01/07/2018, 11:32 AM Pager [phone]  If 7PM-7AM, please contact night-coverage [email or web address] Password [email or web address]

## 2018-01-07 NOTE — Progress Notes (Signed)
STROKE TEAM PROGRESS NOTE     INTERVAL HISTORY His wife is at the bedside.   Plan dc home  Vitals:   01/07/18 0800 01/07/18 0839 01/07/18 1029 01/07/18 1214  BP: (!) 129/95 140/88  (!) 142/79  Pulse: (!) 134  68 (!) 58  Resp:    16  Temp:    97.6 F (36.4 C)  TempSrc:    Oral  SpO2:    97%  Weight:      Height:        CBC:  Recent Labs  Lab 01/05/18 2028 01/05/18 2036 01/07/18 0525  WBC 12.5*  --  9.0  NEUTROABS 10.2*  --   --   HGB 16.4 16.3 15.7  HCT 48.4 48.0 46.7  MCV 90.8  --  89.8  PLT 166  --  606    Basic Metabolic Panel:  Recent Labs  Lab 01/05/18 2028 01/05/18 2036  NA 143 142  K 3.7 3.6  CL 110 110  CO2 18*  --   GLUCOSE 123* 120*  BUN 15 17  CREATININE 1.26* 1.10  CALCIUM 9.2  --    Lipid Panel:     Component Value Date/Time   CHOL 250 (H) 01/06/2018 0438   TRIG 86 01/06/2018 0438   HDL 41 01/06/2018 0438   CHOLHDL 6.1 01/06/2018 0438   VLDL 17 01/06/2018 0438   LDLCALC 192 (H) 01/06/2018 0438   HgbA1c:  Lab Results  Component Value Date   HGBA1C 5.5 01/06/2018   Urine Drug Screen:     Component Value Date/Time   LABOPIA NONE DETECTED 01/05/2018 2028   COCAINSCRNUR NONE DETECTED 01/05/2018 2028   LABBENZ NONE DETECTED 01/05/2018 2028   AMPHETMU NONE DETECTED 01/05/2018 2028   THCU NONE DETECTED 01/05/2018 2028   LABBARB NONE DETECTED 01/05/2018 2028    Alcohol Level     Component Value Date/Time   ETH <10 01/05/2018 2028    IMAGING Ct Angio Head W Or Wo Contrast  Result Date: 01/05/2018 CLINICAL DATA:  Code stroke, slurred speech, RIGHT facial droop. EXAM: CT ANGIOGRAPHY HEAD AND NECK TECHNIQUE: Multidetector CT imaging of the head and neck was performed using the standard protocol during bolus administration of intravenous contrast. Multiplanar CT image reconstructions and MIPs were obtained to evaluate the vascular anatomy. Carotid stenosis measurements (when applicable) are obtained utilizing NASCET criteria, using the  distal internal carotid diameter as the denominator. CONTRAST:  139mL ISOVUE-370 IOPAMIDOL (ISOVUE-370) INJECTION 76% COMPARISON:  CT head earlier today. FINDINGS: CTA NECK FINDINGS Aortic arch: Bovine trunk. Imaged portion shows no evidence of aneurysm or dissection. No significant stenosis of the major arch vessel origins. Right carotid system: No evidence of dissection, stenosis (50% or greater) or occlusion. Left carotid system: No evidence of dissection, stenosis (50% or greater) or occlusion. Vertebral arteries: LEFT dominant. No evidence of dissection, stenosis (50% or greater) or occlusion. Skeleton: Spondylosis.  No aggressive bone lesions. Mandibular tori. Other neck: No mass.  Airway midline. Upper chest: Unremarkable. Review of the MIP images confirms the above findings CTA HEAD FINDINGS Anterior circulation: No significant stenosis, proximal occlusion, aneurysm, or vascular malformation. Mild irregularity of the distal MCA branches suggesting intracranial atherosclerotic disease. Posterior circulation: No significant stenosis, proximal occlusion, aneurysm, or vascular malformation. Venous sinuses: As permitted by contrast timing, patent. Anatomic variants: None of significance. Delayed phase: Not performed. Review of the MIP images confirms the above findings IMPRESSION: No intracranial or extracranial stenosis or occlusion. Mild irregularity of the distal MCA branches  suggesting intracranial atherosclerotic change. These results were called by telephone at the time of interpretation on 01/05/2018 at 8:54 pm to Dr. Kerney Elbe , who verbally acknowledged these results. Electronically Signed   By: Staci Righter M.D.   On: 01/05/2018 20:57   Dg Shoulder Right  Result Date: 01/05/2018 CLINICAL DATA:  Shoulder pain after fall EXAM: RIGHT SHOULDER - 2+ VIEW COMPARISON:  None. FINDINGS: Moderate AC joint degenerative changes. No acute displaced fracture or malalignment. IMPRESSION: No acute osseous  abnormality Electronically Signed   By: Donavan Foil M.D.   On: 01/05/2018 23:10   Ct Angio Neck W Or Wo Contrast  Result Date: 01/05/2018 CLINICAL DATA:  Code stroke, slurred speech, RIGHT facial droop. EXAM: CT ANGIOGRAPHY HEAD AND NECK TECHNIQUE: Multidetector CT imaging of the head and neck was performed using the standard protocol during bolus administration of intravenous contrast. Multiplanar CT image reconstructions and MIPs were obtained to evaluate the vascular anatomy. Carotid stenosis measurements (when applicable) are obtained utilizing NASCET criteria, using the distal internal carotid diameter as the denominator. CONTRAST:  179mL ISOVUE-370 IOPAMIDOL (ISOVUE-370) INJECTION 76% COMPARISON:  CT head earlier today. FINDINGS: CTA NECK FINDINGS Aortic arch: Bovine trunk. Imaged portion shows no evidence of aneurysm or dissection. No significant stenosis of the major arch vessel origins. Right carotid system: No evidence of dissection, stenosis (50% or greater) or occlusion. Left carotid system: No evidence of dissection, stenosis (50% or greater) or occlusion. Vertebral arteries: LEFT dominant. No evidence of dissection, stenosis (50% or greater) or occlusion. Skeleton: Spondylosis.  No aggressive bone lesions. Mandibular tori. Other neck: No mass.  Airway midline. Upper chest: Unremarkable. Review of the MIP images confirms the above findings CTA HEAD FINDINGS Anterior circulation: No significant stenosis, proximal occlusion, aneurysm, or vascular malformation. Mild irregularity of the distal MCA branches suggesting intracranial atherosclerotic disease. Posterior circulation: No significant stenosis, proximal occlusion, aneurysm, or vascular malformation. Venous sinuses: As permitted by contrast timing, patent. Anatomic variants: None of significance. Delayed phase: Not performed. Review of the MIP images confirms the above findings IMPRESSION: No intracranial or extracranial stenosis or occlusion.  Mild irregularity of the distal MCA branches suggesting intracranial atherosclerotic change. These results were called by telephone at the time of interpretation on 01/05/2018 at 8:54 pm to Dr. Kerney Elbe , who verbally acknowledged these results. Electronically Signed   By: Staci Righter M.D.   On: 01/05/2018 20:57   Mr Brain Wo Contrast  Result Date: 01/06/2018 CLINICAL DATA:  Initial evaluation for focal neural deficit. EXAM: MRI HEAD WITHOUT CONTRAST TECHNIQUE: Multiplanar, multiecho pulse sequences of the brain and surrounding structures were obtained without intravenous contrast. COMPARISON:  Prior study from earlier the same day. FINDINGS: Brain: Cerebral volume within normal limits. No significant cerebral white matter changes. Patchy small volume foci of diffusion abnormality seen involving the cortical gray matter of the left temporal and parietal region (series 5, image 57, 63, 67), consistent with acute ischemic infarcts. Single small focus of susceptibility artifact at the parietal region suggestive of petechial hemorrhage (series 12, image 640). No frank hemorrhagic transformation. No mass effect. No other evidence for acute or subacute ischemia. Gray-white matter differentiation otherwise maintained. No other areas of chronic infarction. No other evidence for acute or chronic intracranial hemorrhage. No mass lesion, midline shift or mass effect. No hydrocephalus. No extra-axial fluid collection. Pituitary gland normal. Vascular: Major intracranial vascular flow voids maintained Skull and upper cervical spine: Craniocervical junction normal. Upper cervical spine normal. Bone marrow signal intensity normal.  No scalp soft tissue abnormality. Sinuses/Orbits: Globes and orbital soft tissues within normal limits. Scattered mucosal thickening throughout the paranasal sinuses. No mastoid effusion. Inner ear structures normal. Other: None. IMPRESSION: 1. Patchy small volume acute ischemic cortical  infarcts involving the left parietotemporal region as above. Scant associated petechial hemorrhage without hemorrhagic transformation or mass effect. 2. Otherwise normal brain MRI. Electronically Signed   By: Jeannine Boga M.D.   On: 01/06/2018 00:15   Ct Cerebral Perfusion W Contrast  Result Date: 01/05/2018 CLINICAL DATA:  Suspected LEFT MCA syndrome. EXAM: CT PERFUSION BRAIN TECHNIQUE: Multiphase CT imaging of the brain was performed following IV bolus contrast injection. Subsequent parametric perfusion maps were calculated using RAPID software. CONTRAST:  137mL ISOVUE-370 IOPAMIDOL (ISOVUE-370) INJECTION 76% COMPARISON:  CTA head neck performed earlier. FINDINGS: CT Brain Perfusion Findings: CBF (<30%) Volume: 10mL Perfusion (Tmax>6.0s) volume: 66mL Mismatch Volume: 46mL ASPECTS on noncontrast CT Head: 10 at 8:40 p.m. today. Infarct Core: 0 mL Infarction Location:Not applicable. There is a small wedge defect, 15 mL volume, T-max greater than 4 seconds, LEFT parietal cortex which does not correlate with the clinical picture and nor with a definite vascular occlusion. This was discussed with the ordering provider, and it is felt there were no findings on the CTA to explain this appearance. IMPRESSION: CT brain perfusion is negative.  See discussion above. Electronically Signed   By: Staci Righter M.D.   On: 01/05/2018 21:21   Dg Chest Port 1 View  Result Date: 01/05/2018 CLINICAL DATA:  History of fall to the right side EXAM: PORTABLE CHEST 1 VIEW COMPARISON:  10/18/2016, CT 09/21/2010 FINDINGS: Mild elevation of the right diaphragm. No acute airspace disease or effusion. Normal heart size. No pneumothorax. 8 mm metallic density projecting over the right ninth rib medially, on limited cardiac CT from 09/21/2010, this appears to be within the anterior chest wall musculature. IMPRESSION: No active disease. 8 mm metallic opacity over the right lower chest wall. Electronically Signed   By: Donavan Foil  M.D.   On: 01/05/2018 23:07   Dg Hand Complete Right  Result Date: 01/05/2018 CLINICAL DATA:  Right hand pain history of fall EXAM: RIGHT HAND - COMPLETE 3+ VIEW COMPARISON:  None. FINDINGS: No acute displaced fracture or malalignment. Moderate arthritis at the second MCP joint. Minimal degenerative changes at the third and fifth MCP joints. No radiopaque foreign body. IMPRESSION: No acute osseous abnormality Electronically Signed   By: Donavan Foil M.D.   On: 01/05/2018 23:09   Ct Head Code Stroke Wo Contrast  Result Date: 01/05/2018 CLINICAL DATA:  Code stroke. Last seen normal at 1500 hours, RIGHT facial droop with slurred speech. EXAM: CT HEAD WITHOUT CONTRAST TECHNIQUE: Contiguous axial images were obtained from the base of the skull through the vertex without intravenous contrast. COMPARISON:  None. FINDINGS: Brain: No evidence of acute infarction, hemorrhage, hydrocephalus, extra-axial collection or mass lesion/mass effect. Normal for age cerebral volume. No white matter disease. Vascular: No hyperdense vessel or unexpected calcification. Skull: Normal. Negative for fracture or focal lesion. Sinuses/Orbits: No acute finding. Other: None. ASPECTS [facility] Stroke Program Early CT Score) - Ganglionic level infarction (caudate, lentiform nuclei, internal capsule, insula, M1-M3 cortex): 7 - Supraganglionic infarction (M4-M6 cortex): 3 Total score (0-10 with 10 being normal): 10 IMPRESSION: 1. Negative exam 2. ASPECTS is 10. These results were communicated to Dr. Cheral Marker at 8:40 [place] 7/21/2019by text page via the [facility] messaging system. Electronically Signed   By: Staci Righter M.D.   On:  01/05/2018 20:42   2D echocardiogram 01/06/2018 Pending  EEG 01/06/2018 This is a normal awake electroencephalogram. There are no focal lateralizing or epileptiform features.   PHYSICAL EXAM  pleasant middle-age male currently not in distress. Irregularly irregular heart rhythm. . Afebrile. Head is  nontraumatic. Neck is supple without bruit.    Cardiac exam no murmur or gallop. Lungs are clear to auscultation. Distal pulses are well felt. Neurological Exam :  Awake alert oriented 2. Moderate expressive and mild receptive aphasia. Can follow simple commands. Difficulty with naming and repetition. Mild dysarthria. Extraocular moments are full range without nystagmus. Blinks to threat bilaterally. Right lower facial weakness. Tongue midline. Motor system exam shows right hemiparesis with right upper extremity strength aorta 5 with significant weakness of right grip and intrinsic hand muscles. Right lower extremity is 4/5. Normal strength in the left side. Sensation appears intact. Deep tendon reflexes symmetric. Plantars downgoing. Gait not tested. ASSESSMENT/PLAN [name] is a 62 y.o. male with history of ETOH abuse in remission x 12 yrs, HTN, HLD, OSA presenting with expressive aphasia.   Stroke:   Patchy small left parietal temporal cortical infarcts embolic secondary to new onset atrial fibrillation   Code Stroke CT head No acute stroke. ASPECTS 10.     CTA head & neck no significant  stenosis.  CT perfusion negative  MRI patchy small left parietal temporal cortical infarcts with associated petechial hemorrhage  EEG-no seizure LDL 192  HgbA1c 5.5  Eliquis for VTE prophylaxis  aspirin 325 mg daily prior to admission, new A. fib lead to IV heparin, replace with full dose Lovenox now changed to Eliquis 5 mg twice daily for secondary stroke prevention   Therapy recommendations:  No PT  Disposition:  pending   Atrial Fibrillation w/ RVR, new dx  Home anticoagulation:  none   On cardizem. Started for HTN during ETOH withdrawal..  CHA2DS2-VASc Score = at least 3, ?2 oral anticoagulation recommended  Age in Years:  <65   0  Sex:  Male   0  Hypertension History:  yes   +1     Diabetes Mellitus:  0  Congestive Heart Failure History:  0  Vascular Disease History:  0      Stroke/TIA/Thromboembolism History:  yes   +2 . Continue Eliquis (apixaban) daily at discharge  Hypertension  Stable . Permissive hypertension (OK if < 180 given petechial hemorrhage) but gradually normalize in 5-7 days . Long-term BP goal normotensive  Hyperlipidemia  Home meds: Lipitor 80, resumed in hospital  LDL 192, goal < 70  Continue statin at discharge  Other Stroke Risk Factors  Hx ETOH abuse, stopped ~12 yrs ago  Coronary artery disease  Obstructive sleep apnea, used CPAP at home in the past, but not for 10 years  Hospital day # 1    I  He presented with aphasia and right hemiplegia secondary to embolic left MCA infarct due to atrial fibrillation. She remains at risk for neurological worsening and further strokes and will benefit with long-term anticoagulation.aggressive risk factor modification. Treatment for hyperlipidemia Discussed with patient and family at the bedside and Dr.Adhikari. Greater than 50% time during this 15 min visit was spent on counseling and coordination of care about his embolic stroke and discussion about prevention and treatment options  Antony Contras, MD Medical Director [place] Pager: [phone] 01/07/2018 12:37 PM  To contact Stroke Continuity provider, please refer to [email or web address] After hours, contact General Neurology

## 2018-01-07 NOTE — Evaluation (Signed)
Occupational Therapy Evaluation Patient Details Name: Richard [name] MRN: [id number] DOB: [birth year] Today's Date: 01/07/2018    History of Present Illness 62 yo admitted with dysarthria and RUE weakness with MRI demonstrating Left parietotemporal infact with new Afib. PMhx: HTN, HLD, sleep apnea   Clinical Impression   PT admitted with L parietotemporal infarct. Pt currently with functional limitiations due to the deficits listed below (see OT problem list). Pt currently with R UE deficits affecting all adls. Pt provided home exercise program and educated to use R UE as much as possible to help with recovery. Wife present during education.  Pt will benefit from skilled OT to increase their independence and safety with adls and balance to allow discharge outpatient OT.     Follow Up Recommendations  Outpatient OT    Equipment Recommendations  None recommended by OT    Recommendations for Other Services       Precautions / Restrictions Precautions Precautions: Fall      Mobility Bed Mobility Overal bed mobility: Modified Independent                Transfers Overall transfer level: Modified independent                    Balance                                           ADL either performed or assessed with clinical judgement   ADL Overall ADL's : Needs assistance/impaired Eating/Feeding: Set up Eating/Feeding Details (indicate cue type and reason): provided red foam and educated on use with utensils. educated on the purchase of forks/ spoons with incr grasp handle.    Grooming Details (indicate cue type and reason): educated on the use of electric tooth brush to help with oral care Upper Body Bathing: Minimal assistance               Toilet Transfer: Modified Independent             General ADL Comments: pt provided blue ball and red foam. pt educated on use of phone / ipad for dexerity games. pt and wife educated on task  within the home for home exercises: making cookies, playdoh play, plastic container of rice with treats in the rice to dig them out, palm to finger translation with drink cap, placing items in a piggie bank and weight bearing on R UE when not in use.      Vision Baseline Vision/History: No visual deficits       Perception     Praxis      Pertinent Vitals/Pain Pain Assessment: No/denies pain     Hand Dominance Right   Extremity/Trunk Assessment Upper Extremity Assessment Upper Extremity Assessment: RUE deficits/detail RUE Deficits / Details: edema noted at forearm, wrist and 2nd 3rd digit of R UE. pt with decr fine motor and decr wrist flexion/ extension secondary to edema. pt with decr elbow extension secondary to edema RUE Sensation: decreased proprioception;decreased light touch RUE Coordination: decreased fine motor;decreased gross motor   Lower Extremity Assessment Lower Extremity Assessment: Defer to PT evaluation   Cervical / Trunk Assessment Cervical / Trunk Assessment: Normal   Communication Communication Communication: Expressive difficulties   Cognition Arousal/Alertness: Awake/alert Behavior During Therapy: WFL for tasks assessed/performed Overall Cognitive Status: Within Functional Limits for tasks assessed  General Comments       Exercises Exercises: Other exercises(see above comment) Other Exercises Other Exercises: recommend outpatient R UE follow up    Shoulder Instructions      Home Living Family/patient expects to be discharged to:: Private residence Living Arrangements: Spouse/significant other Available Help at Discharge: Family;Available 24 hours/day Type of Home: House Home Access: Stairs to enter [employer] of Steps: 2   Home Layout: One level     Bathroom Shower/Tub: Teacher, early years/pre: Standard     Home Equipment: None      Lives With: Spouse     Prior Functioning/Environment Level of Independence: Independent        Comments: pt retired from [employer]. likes to golf, fish, garden, restore furniture        OT Problem List:        OT Treatment/Interventions:      OT Goals(Current goals can be found in the care plan section) Acute Rehab OT Goals Patient Stated Goal: to use R UE OT Goal Formulation: With patient  OT Frequency:     Barriers to D/C:            Co-evaluation              AM-PAC PT "6 Clicks" Daily Activity     Outcome Measure Help from another person eating meals?: A Little Help from another person taking care of personal grooming?: A Little Help from another person toileting, which includes using toliet, bedpan, or urinal?: A Little Help from another person bathing (including washing, rinsing, drying)?: A Little Help from another person to put on and taking off regular upper body clothing?: A Little Help from another person to put on and taking off regular lower body clothing?: A Little 6 Click Score: 18   End of Session Nurse Communication: Mobility status;Precautions  Activity Tolerance: Patient tolerated treatment well Patient left: in bed;with call bell/phone within reach;with family/visitor present                   Time: 7412-8786 OT Time Calculation (min): 28 min Charges:  OT General Charges $OT Visit: 1 Visit OT Evaluation $OT Eval Moderate Complexity: 1 Mod G-Codes:      Jeri Modena   OTR/L Pager: [phone] Office: [phone] .   Parke Poisson B 01/07/2018, 12:45 PM

## 2018-01-07 NOTE — Care Management Note (Addendum)
Case Management Note  Patient Details  Name: Richard [name] MRN: [id number] Date of Birth: June 10, 1956  Subjective/Objective:                    Action/Plan: Pt discharging home with self care. CM consulted for outpatient therapy. CM met with the patient and he would like to attend [facility]. Orders in Epic and information on the AVS.  Pt discharging on Eliquis. CM provided the patient with 30 day free card and $10 co pay card.  Pt has intermittent supervision at home with his family and transportation home.    Expected Discharge Date:  01/07/18               Expected Discharge Plan:  OP Rehab  In-House Referral:     Discharge planning Services  CM Consult  Post Acute Care Choice:    Choice offered to:     DME Arranged:    DME Agency:     HH Arranged:    HH Agency:     Status of Service:  Completed, signed off  If discussed at [employer] of Stay Meetings, dates discussed:    Additional Comments:  Pollie Friar, RN 01/07/2018, 1:01 PM

## 2018-01-07 NOTE — Evaluation (Signed)
Speech Language Pathology Evaluation Patient Details Name: Richard [name] MRN: [id number] DOB: [birth year] Today's Date: 01/07/2018 Time: 0109-3235 SLP Time Calculation (min) (ACUTE ONLY): 35 min  Problem List:  Patient Active Problem List   Diagnosis Date Noted  . Stroke ([place]) 01/06/2018  . New onset a-fib ([place]) 01/06/2018  . Cough 10/18/2016  . Wheezing 10/18/2016  . Allergic rhinitis 08/31/2014  . Recurrent low back pain 10/15/2012  . Depression 08/30/2011  . Preventative health care 08/30/2011  . CAD (coronary artery disease) 08/25/2011  . Alcoholism in remission ([place]) 08/25/2011  . Hyperlipidemia   . Hypertension   . CHEST PAIN 08/30/2010   Past Medical History:  Past Medical History:  Diagnosis Date  . Alcoholism in remission ([place]) 08/25/2011  . Allergy    seasonal  . CAD (coronary artery disease) 08/25/2011  . Depression   . Hyperlipidemia   . Hypertension   . Sleep apnea    did use cpap about 10 years ago no use now   Past Surgical History:  Past Surgical History:  Procedure Laterality Date  . COLONOSCOPY  10-22-11   3 polyps  . FINGER NEUROPLASTY     left index finger  . left achillies  2003  . POLYPECTOMY     HPI:  62 yo admitted with dysarthria and RUE weakness with MRI demonstrating Left parietotemporal infact with new Afib. PMhx: HTN, HLD, sleep apnea   Assessment / Plan / Recommendation Clinical Impression  Pt presents with mild expressive aphasia impacting word finding when expressing semi-complex/abstract thoughts. Pt is aware of word finding deficits and is successful in self-correcting. However it is frustrating and laborsome to communicate at semi-complex level. Education provided on nature of aphasia, compensatory strategies as well as expected progress. In addition, pt presents with mild dysarthria d/t right facial weakness that results in imprecise articulation at the conversation level. Pt is > 90% intelligible at the simple conversation level.  Compensatory speech intelligibility strategies given and pt able to demonstrate understanding and use.     SLP Assessment  SLP Recommendation/Assessment: All further Speech Lanaguage Pathology  needs can be addressed in the next venue of care SLP Visit Diagnosis: Aphasia (R47.01);Dysarthria and anarthria (R47.1)    Follow Up Recommendations  Outpatient SLP    Frequency and Duration           SLP Evaluation Cognition  Overall Cognitive Status: Within Functional Limits for tasks assessed Arousal/Alertness: Awake/alert Orientation Level: Oriented X4 Attention: Alternating Alternating Attention: Appears intact Memory: Appears intact Awareness: Appears intact Problem Solving: Appears intact Safety/Judgment: Appears intact       Comprehension  Auditory Comprehension Overall Auditory Comprehension: Appears within functional limits for tasks assessed Yes/No Questions: Within Functional Limits Commands: Within Functional Limits Conversation: Simple Visual Recognition/Discrimination Discrimination: Within Function Limits Reading Comprehension Reading Status: Within funtional limits    Expression Expression Primary Mode of Expression: Verbal Verbal Expression Overall Verbal Expression: Appears within functional limits for tasks assessed Initiation: No impairment Automatic Speech: Name;Social Response Level of Generative/Spontaneous Verbalization: Conversation Repetition: No impairment Naming: Impairment Other Naming Comments: (high level word finding deficts in semi-complex thoughts) Verbal Errors: Phonemic paraphasias;Aware of errors Pragmatics: No impairment Effective Techniques: Semantic cues;Open ended questions Non-Verbal Means of Communication: Not applicable Written Expression Dominant Hand: Right Written Expression: Not tested   Oral / Motor  Oral Motor/Sensory Function Overall Oral Motor/Sensory Function: Within functional limits Motor Speech Overall Motor  Speech: Appears within functional limits for tasks assessed Respiration: Within functional limits Phonation: Normal Resonance: Within  functional limits Articulation: Within functional limitis Intelligibility: Intelligibility reduced Word: 75-100% accurate Phrase: 75-100% accurate Sentence: 75-100% accurate Conversation: 75-100% accurate Motor Planning: Witnin functional limits Motor Speech Errors: Not applicable Effective Techniques: Slow rate;Increased vocal intensity;Over-articulate   GO                    Nolah Krenzer 01/07/2018, 9:40 AM

## 2018-01-07 NOTE — Progress Notes (Addendum)
Progress Note  Patient Name: Richard [name] Date of Encounter: 01/07/2018  Primary Cardiologist: Candee Furbish, MD , previously Dr. [place]ique (2012)  Subjective   Pt feeling well, no bleeding problems. He is generally unaware of his rhythm.  Inpatient Medications    Scheduled Meds: .  stroke: mapping our early stages of recovery book   Does not apply Once  . apixaban  5 mg Oral BID  . aspirin  300 mg Rectal Daily   Or  . aspirin  325 mg Oral Daily  . atorvastatin  80 mg Oral QHS  . ezetimibe  10 mg Oral Daily  . lisinopril  5 mg Oral Daily  . metoprolol succinate  100 mg Oral Daily   Continuous Infusions:  PRN Meds: acetaminophen **OR** acetaminophen (TYLENOL) oral liquid 160 mg/5 mL **OR** acetaminophen   Vital Signs    Vitals:   01/07/18 0318 01/07/18 0800 01/07/18 0839 01/07/18 1029  BP: (!) 127/97 (!) 129/95 140/88   Pulse: 87 (!) 134  68  Resp: 13     Temp: 98.4 F (36.9 C)     TempSrc: Oral     SpO2: 93%     Weight:      Height:        Intake/Output Summary (Last 24 hours) at 01/07/2018 1031 Last data filed at 01/07/2018 3845 Gross per 24 hour  Intake 1406.2 ml  Output 425 ml  Net 981.2 ml   Filed Weights   01/05/18 2118  Weight: 210 lb 5.1 oz (95.4 kg)    Telemetry    Afib, 80-90s - Personally Reviewed  ECG    No new tracings - Personally Reviewed  Physical Exam   GEN: No acute distress.   Neck: No JVD Cardiac: irregular rhythm, regular rate, no murmur Respiratory: Clear to auscultation bilaterally. GI: Soft, nontender, non-distended  MS: No edema; No deformity. Neuro:  Nonfocal  Psych: Normal affect   Labs    Chemistry Recent Labs  Lab 01/05/18 2028 01/05/18 2036  NA 143 142  K 3.7 3.6  CL 110 110  CO2 18*  --   GLUCOSE 123* 120*  BUN 15 17  CREATININE 1.26* 1.10  CALCIUM 9.2  --   PROT 7.0  --   ALBUMIN 4.1  --   AST 31  --   ALT 31  --   ALKPHOS 73  --   BILITOT 1.0  --   GFRNONAA 59*  --   GFRAA >60  --   ANIONGAP  15  --      Hematology Recent Labs  Lab 01/05/18 2028 01/05/18 2036 01/07/18 0525  WBC 12.5*  --  9.0  RBC 5.33  --  5.20  HGB 16.4 16.3 15.7  HCT 48.4 48.0 46.7  MCV 90.8  --  89.8  MCH 30.8  --  30.2  MCHC 33.9  --  33.6  RDW 12.7  --  12.6  PLT 166  --  161    Cardiac EnzymesNo results for input(s): TROPONINI in the last 168 hours.  Recent Labs  Lab 01/05/18 2034  TROPIPOC 0.02     BNPNo results for input(s): BNP, PROBNP in the last 168 hours.   DDimer No results for input(s): DDIMER in the last 168 hours.   Radiology    Ct Angio Head W Or Wo Contrast  Result Date: 01/05/2018 CLINICAL DATA:  Code stroke, slurred speech, RIGHT facial droop. EXAM: CT ANGIOGRAPHY HEAD AND NECK TECHNIQUE: Multidetector CT imaging of the  head and neck was performed using the standard protocol during bolus administration of intravenous contrast. Multiplanar CT image reconstructions and MIPs were obtained to evaluate the vascular anatomy. Carotid stenosis measurements (when applicable) are obtained utilizing NASCET criteria, using the distal internal carotid diameter as the denominator. CONTRAST:  133mL ISOVUE-370 IOPAMIDOL (ISOVUE-370) INJECTION 76% COMPARISON:  CT head earlier today. FINDINGS: CTA NECK FINDINGS Aortic arch: Bovine trunk. Imaged portion shows no evidence of aneurysm or dissection. No significant stenosis of the major arch vessel origins. Right carotid system: No evidence of dissection, stenosis (50% or greater) or occlusion. Left carotid system: No evidence of dissection, stenosis (50% or greater) or occlusion. Vertebral arteries: LEFT dominant. No evidence of dissection, stenosis (50% or greater) or occlusion. Skeleton: Spondylosis.  No aggressive bone lesions. Mandibular tori. Other neck: No mass.  Airway midline. Upper chest: Unremarkable. Review of the MIP images confirms the above findings CTA HEAD FINDINGS Anterior circulation: No significant stenosis, proximal occlusion,  aneurysm, or vascular malformation. Mild irregularity of the distal MCA branches suggesting intracranial atherosclerotic disease. Posterior circulation: No significant stenosis, proximal occlusion, aneurysm, or vascular malformation. Venous sinuses: As permitted by contrast timing, patent. Anatomic variants: None of significance. Delayed phase: Not performed. Review of the MIP images confirms the above findings IMPRESSION: No intracranial or extracranial stenosis or occlusion. Mild irregularity of the distal MCA branches suggesting intracranial atherosclerotic change. These results were called by telephone at the time of interpretation on 01/05/2018 at 8:54 pm to Dr. Kerney Elbe , who verbally acknowledged these results. Electronically Signed   By: Staci Righter M.D.   On: 01/05/2018 20:57   Dg Shoulder Right  Result Date: 01/05/2018 CLINICAL DATA:  Shoulder pain after fall EXAM: RIGHT SHOULDER - 2+ VIEW COMPARISON:  None. FINDINGS: Moderate AC joint degenerative changes. No acute displaced fracture or malalignment. IMPRESSION: No acute osseous abnormality Electronically Signed   By: Donavan Foil M.D.   On: 01/05/2018 23:10   Ct Angio Neck W Or Wo Contrast  Result Date: 01/05/2018 CLINICAL DATA:  Code stroke, slurred speech, RIGHT facial droop. EXAM: CT ANGIOGRAPHY HEAD AND NECK TECHNIQUE: Multidetector CT imaging of the head and neck was performed using the standard protocol during bolus administration of intravenous contrast. Multiplanar CT image reconstructions and MIPs were obtained to evaluate the vascular anatomy. Carotid stenosis measurements (when applicable) are obtained utilizing NASCET criteria, using the distal internal carotid diameter as the denominator. CONTRAST:  142mL ISOVUE-370 IOPAMIDOL (ISOVUE-370) INJECTION 76% COMPARISON:  CT head earlier today. FINDINGS: CTA NECK FINDINGS Aortic arch: Bovine trunk. Imaged portion shows no evidence of aneurysm or dissection. No significant stenosis of  the major arch vessel origins. Right carotid system: No evidence of dissection, stenosis (50% or greater) or occlusion. Left carotid system: No evidence of dissection, stenosis (50% or greater) or occlusion. Vertebral arteries: LEFT dominant. No evidence of dissection, stenosis (50% or greater) or occlusion. Skeleton: Spondylosis.  No aggressive bone lesions. Mandibular tori. Other neck: No mass.  Airway midline. Upper chest: Unremarkable. Review of the MIP images confirms the above findings CTA HEAD FINDINGS Anterior circulation: No significant stenosis, proximal occlusion, aneurysm, or vascular malformation. Mild irregularity of the distal MCA branches suggesting intracranial atherosclerotic disease. Posterior circulation: No significant stenosis, proximal occlusion, aneurysm, or vascular malformation. Venous sinuses: As permitted by contrast timing, patent. Anatomic variants: None of significance. Delayed phase: Not performed. Review of the MIP images confirms the above findings IMPRESSION: No intracranial or extracranial stenosis or occlusion. Mild irregularity of the distal MCA  branches suggesting intracranial atherosclerotic change. These results were called by telephone at the time of interpretation on 01/05/2018 at 8:54 pm to Dr. Kerney Elbe , who verbally acknowledged these results. Electronically Signed   By: Staci Righter M.D.   On: 01/05/2018 20:57   Mr Brain Wo Contrast  Result Date: 01/06/2018 CLINICAL DATA:  Initial evaluation for focal neural deficit. EXAM: MRI HEAD WITHOUT CONTRAST TECHNIQUE: Multiplanar, multiecho pulse sequences of the brain and surrounding structures were obtained without intravenous contrast. COMPARISON:  Prior study from earlier the same day. FINDINGS: Brain: Cerebral volume within normal limits. No significant cerebral white matter changes. Patchy small volume foci of diffusion abnormality seen involving the cortical gray matter of the left temporal and parietal region  (series 5, image 57, 63, 67), consistent with acute ischemic infarcts. Single small focus of susceptibility artifact at the parietal region suggestive of petechial hemorrhage (series 12, image 640). No frank hemorrhagic transformation. No mass effect. No other evidence for acute or subacute ischemia. Gray-white matter differentiation otherwise maintained. No other areas of chronic infarction. No other evidence for acute or chronic intracranial hemorrhage. No mass lesion, midline shift or mass effect. No hydrocephalus. No extra-axial fluid collection. Pituitary gland normal. Vascular: Major intracranial vascular flow voids maintained Skull and upper cervical spine: Craniocervical junction normal. Upper cervical spine normal. Bone marrow signal intensity normal. No scalp soft tissue abnormality. Sinuses/Orbits: Globes and orbital soft tissues within normal limits. Scattered mucosal thickening throughout the paranasal sinuses. No mastoid effusion. Inner ear structures normal. Other: None. IMPRESSION: 1. Patchy small volume acute ischemic cortical infarcts involving the left parietotemporal region as above. Scant associated petechial hemorrhage without hemorrhagic transformation or mass effect. 2. Otherwise normal brain MRI. Electronically Signed   By: Jeannine Boga M.D.   On: 01/06/2018 00:15   Ct Cerebral Perfusion W Contrast  Result Date: 01/05/2018 CLINICAL DATA:  Suspected LEFT MCA syndrome. EXAM: CT PERFUSION BRAIN TECHNIQUE: Multiphase CT imaging of the brain was performed following IV bolus contrast injection. Subsequent parametric perfusion maps were calculated using RAPID software. CONTRAST:  177mL ISOVUE-370 IOPAMIDOL (ISOVUE-370) INJECTION 76% COMPARISON:  CTA head neck performed earlier. FINDINGS: CT Brain Perfusion Findings: CBF (<30%) Volume: 52mL Perfusion (Tmax>6.0s) volume: 19mL Mismatch Volume: 90mL ASPECTS on noncontrast CT Head: 10 at 8:40 p.m. today. Infarct Core: 0 mL Infarction  Location:Not applicable. There is a small wedge defect, 15 mL volume, T-max greater than 4 seconds, LEFT parietal cortex which does not correlate with the clinical picture and nor with a definite vascular occlusion. This was discussed with the ordering provider, and it is felt there were no findings on the CTA to explain this appearance. IMPRESSION: CT brain perfusion is negative.  See discussion above. Electronically Signed   By: Staci Righter M.D.   On: 01/05/2018 21:21   Dg Chest Port 1 View  Result Date: 01/05/2018 CLINICAL DATA:  History of fall to the right side EXAM: PORTABLE CHEST 1 VIEW COMPARISON:  10/18/2016, CT 09/21/2010 FINDINGS: Mild elevation of the right diaphragm. No acute airspace disease or effusion. Normal heart size. No pneumothorax. 8 mm metallic density projecting over the right ninth rib medially, on limited cardiac CT from 09/21/2010, this appears to be within the anterior chest wall musculature. IMPRESSION: No active disease. 8 mm metallic opacity over the right lower chest wall. Electronically Signed   By: Donavan Foil M.D.   On: 01/05/2018 23:07   Dg Hand Complete Right  Result Date: 01/05/2018 CLINICAL DATA:  Right hand pain  history of fall EXAM: RIGHT HAND - COMPLETE 3+ VIEW COMPARISON:  None. FINDINGS: No acute displaced fracture or malalignment. Moderate arthritis at the second MCP joint. Minimal degenerative changes at the third and fifth MCP joints. No radiopaque foreign body. IMPRESSION: No acute osseous abnormality Electronically Signed   By: Donavan Foil M.D.   On: 01/05/2018 23:09   Ct Head Code Stroke Wo Contrast  Result Date: 01/05/2018 CLINICAL DATA:  Code stroke. Last seen normal at 1500 hours, RIGHT facial droop with slurred speech. EXAM: CT HEAD WITHOUT CONTRAST TECHNIQUE: Contiguous axial images were obtained from the base of the skull through the vertex without intravenous contrast. COMPARISON:  None. FINDINGS: Brain: No evidence of acute infarction,  hemorrhage, hydrocephalus, extra-axial collection or mass lesion/mass effect. Normal for age cerebral volume. No white matter disease. Vascular: No hyperdense vessel or unexpected calcification. Skull: Normal. Negative for fracture or focal lesion. Sinuses/Orbits: No acute finding. Other: None. ASPECTS [facility] Stroke Program Early CT Score) - Ganglionic level infarction (caudate, lentiform nuclei, internal capsule, insula, M1-M3 cortex): 7 - Supraganglionic infarction (M4-M6 cortex): 3 Total score (0-10 with 10 being normal): 10 IMPRESSION: 1. Negative exam 2. ASPECTS is 10. These results were communicated to Dr. Cheral Marker at 8:40 [place] 7/21/2019by text page via the [facility] messaging system. Electronically Signed   By: Staci Righter M.D.   On: 01/05/2018 20:42    Cardiac Studies   Echo 01/06/18: Study Conclusions - Left ventricle: The cavity size was normal. Systolic function was   mildly to moderately reduced. The estimated ejection fraction was   in the range of 40% to 45%. Moderate diffuse hypokinesis with no   identifiable regional variations. - Aortic valve: There was mild regurgitation. - Left atrium: The atrium was mildly dilated. - Right atrium: The atrium was mildly dilated. - Pulmonary arteries: PA peak pressure: 31 mm Hg (S).  Patient Profile     62 y.o. male   Assessment & Plan    1. Atrial fibrillation with RVR - diltiazem drip D/C'ed today and transitioned to PO dosing to toprol 100 mg - rate is 80-90s currently - This patients CHA2DS2-VASc Score and unadjusted Ischemic Stroke Rate (% per year) is equal to 4.8 % stroke rate/year from a score of 4 (stroke, CHF, HTN) - continue eliquis - Hb 15.7 - hold atenolol   2. New systolic heart failure - echo with LVEF of 40-45% - pt appears euvolemic   3. Stroke - per nephrology  - continue statin - 01/06/2018: Cholesterol 250; HDL 41; LDL Cholesterol 192; Triglycerides 86; VLDL 17 - will repeat in 6-8 weeks witjh LFTs   If  rate-controlled on toprol with adequate pressure, OK to discharge from cardiology standpoint. Follow up has been setup.     For questions or updates, please contact [place] Please consult [email or web address] for contact info under Cardiology/STEMI.      Signed, Tami Lin Duke, PA  01/07/2018, 10:31 AM    Stroke symptoms noted, no CP, no awareness of AFIB GEN: Well nourished, well developed, in no acute distress  HEENT: normal  Neck: no JVD, carotid bruits, or masses Cardiac: IRRR; no murmurs, rubs, or gallops,no edema  Respiratory:  clear to auscultation bilaterally, normal work of breathing GI: soft, nontender, nondistended, + BS MS: no deformity or atrophy  Skin: warm and dry, no rash Neuro:  Alert and Oriented x 3, left weakness, aphasia Psych: euthymic mood, full affect  ECHO: EF 45% mildly reduced  A/P:  Cardiomyopathy  - may  be stroke mediated.   - Switched to Toprol 100 QD. Off atenolol 25 bid and dilt 240  - Added lisinopril 5  Stroke   - per neuro  - secondary prevention  AFIB RVR  - reason for CVA likely  - Eliquis  - Toprol rate control  OK with DC from cards perspective Will follow up.  [facility] will sign off.   Medication Recommendations:  Toprol 100 Other recommendations (labs, testing, etc):  none Follow up as an outpatient:  appt made.    Candee Furbish, MD

## 2018-01-07 NOTE — Telephone Encounter (Signed)
New Message:     FYI:    TOC appt on 01/20/18 at 10:00 with Gerhardt per Janace Hoard

## 2018-01-08 NOTE — Telephone Encounter (Signed)
Patient contacted regarding discharge from [facility] on 01/07/18 .  Patient understands to follow up with provider Truitt Merle, NP on 01/20/18 at 10:00 at [facility]. Patient understands discharge instructions? Yes Patient understands medications and regiment? Yes Patient understands to bring all medications to this visit? Yes  Asked if patient had additional questions and he said no

## 2018-01-09 ENCOUNTER — Other Ambulatory Visit: Payer: Self-pay | Admitting: *Deleted

## 2018-01-09 NOTE — Patient Outreach (Signed)
[place] [facility]) Care Management  01/09/2018  Richard [name] [birth year] [id number]   EMMI- stroke      RED ON EMMI ALERT Day # 1 Date: 01/08/18 Red Alert Reason: questions/problems with meds? Yes  Issued stroke-preventing secondary stroke   Outreach attempt # 1 successful at his mobile number listed for EMMI Patient is able to verify HIPAA [facility] Care Management RN reviewed and addressed red alert with patient Richard [name] report he is right handed and does have questions about meds and diet [facility] RN CM reviewed with him the after summary discharge sheet and answered questions about each of his new medications (Eliquis, Toprol, Zetia asa 325 without EC, lisinopril), and the low sodium diet. Recommend 1500 mg with a limit of 2300 mg/day.  His wife and daughter have been grocery shopping for him to assist with low sodium foods. Discussed coverage gap related to meds like Eliquis. He voiced understanding and wrote down information.  Richard [name] also asked questions [facility] RN CM reviewed [facility] services with patient. Patient not needing services at this time Encouraged use of his blue cross and blue shield 24 hr RN line and provide the standard 24 hr [facility] RN line number and CM work number and hours Discussed the importance of a hospital f/u visit to Dr Jenny Reichmann after he noted his next appointment is not until May 2020 He was last seen by Dr Jenny Reichmann on 11/02/17  He is scheduled to start neuro rehab on 01/20/18  Conditions cerebral infarction, CHF, Afib, HTN, hyperlipidemia, alcohol dependence, OSA, major depressive disorder, low back pain, allergies Richard Keena voiced his appreciation of information reviewed, discussed and services rendered during this call   Advised patient that there will be further automated EMMI- post discharge calls to assess how the patient is doing following the recent hospitalization Advised the patient that another call may be received from a nurse if any of their responses were  abnormal. Patient voiced understanding and was appreciative of f/u call.  Plan: [facility] RN CM will send further low sodium diet and stroke rehab information EMMI education to his verified e-mail address and follow up within 2 weeks for EMMI stroke and plan for case closure   Kimberly L. Lavina Hamman, RN, BSN, CCM [facility] Telephonic Care Management Care Coordinator Direct number [phone]  Main [facility] number [phone] Fax number [phone]

## 2018-01-09 NOTE — Patient Outreach (Signed)
[place] [facility]) Care Management  01/09/2018  Richard [name] 08/20/1955 [id number]   Care coordination   Deleted educational letter to patient as he will receive EMMI materials via his e-mail address Consulted with [place] Lavina Hamman, RN, BSN, CCM [facility] Telephonic Care Management Care Coordinator Direct number [phone]  Main [facility] number [phone] Fax number [phone]

## 2018-01-16 ENCOUNTER — Encounter: Payer: Self-pay | Admitting: Internal Medicine

## 2018-01-16 ENCOUNTER — Telehealth: Payer: Self-pay | Admitting: Internal Medicine

## 2018-01-16 ENCOUNTER — Ambulatory Visit: Payer: Federal, State, Local not specified - PPO | Admitting: Internal Medicine

## 2018-01-16 DIAGNOSIS — R739 Hyperglycemia, unspecified: Secondary | ICD-10-CM

## 2018-01-16 DIAGNOSIS — I10 Essential (primary) hypertension: Secondary | ICD-10-CM

## 2018-01-16 DIAGNOSIS — I4891 Unspecified atrial fibrillation: Secondary | ICD-10-CM | POA: Diagnosis not present

## 2018-01-16 DIAGNOSIS — I639 Cerebral infarction, unspecified: Secondary | ICD-10-CM | POA: Diagnosis not present

## 2018-01-16 DIAGNOSIS — M109 Gout, unspecified: Secondary | ICD-10-CM | POA: Diagnosis not present

## 2018-01-16 DIAGNOSIS — E7800 Pure hypercholesterolemia, unspecified: Secondary | ICD-10-CM

## 2018-01-16 MED ORDER — COLCHICINE 0.6 MG PO TABS: ORAL_TABLET | ORAL | 1 refills | Status: DC

## 2018-01-16 MED ORDER — ATORVASTATIN CALCIUM 80 MG PO TABS: 80.0000 mg | ORAL_TABLET | Freq: Every day | ORAL | 3 refills | Status: DC

## 2018-01-16 NOTE — Telephone Encounter (Signed)
Copied from [place] [phone]. Topic: Quick Communication - Rx Refill/Question >> Jan 16, 2018  2:36 PM Oliver Pila B wrote: Medication: colchicine 0.6 MG tablet [[id number]]  [facility] is concerned about the dosage of the medication as well the interaction w/ another medication the pt is taking

## 2018-01-16 NOTE — Assessment & Plan Note (Signed)
Very mild, a1c normal Lab Results  Component Value Date   HGBA1C 5.5 01/06/2018  to cont same tx

## 2018-01-16 NOTE — Progress Notes (Signed)
Subjective:    Patient ID: Richard [name], male    DOB: 10/28/1955, 62 y.o.   MRN: [id number]  HPI Patient is a 62 year old man with past medical history hypertension, hyperlipidemia, sleep apnea who presents to the emergency department with dysarthria and right upper extremity weakness.He was also found to be in A. fib with RVR on presentation.MRI brain was performed which revealed small volume acute ischemic cortical infarct involving the left parietal temporal region, no hemorrhage transformation or mass effect noted. Neurology was consulted. Cardiology also consulted  for new onset A. fib with RVR.  His echocardiogram also showed reduced ejection fraction to 40 to 45%.  Medications adjusted.  Weakness on his right upper extremity has been improving, now to the point with mild RUE motor deficit and numbness at fingertips only.  Patient seen by physical therapy with no specific recommendation.  Follow-up with neurology and cardiology on discharge.  CTA head and neck showed no intracranial or extracranial stenosis or occlusion. Not taking the statin as nurse said not to at time of d/c.   Has cardiology f/u next mon, and neurology aug 21.  Pt denies chest pain, increased sob or doe, wheezing, orthopnea, PND, increased LE swelling, palpitations, dizziness or syncope.  Pt denies new neurological symptoms such as new headache, or new facial or extremity weakness or numbness.  Dr Leonie Man note 7/22 indicates eliquis only for stroke secondary prevention.  D/c papers indicate to stop the asa 325 AND taking the asa 325, wife is very confused today.  Also today he has acute gout that started within hours of recent d.c actually improved in last few days without specific now mild only to DIP of index finger right hand and right great toe Past Medical History:  Diagnosis Date  . Alcoholism in remission ([place]) 08/25/2011  . Allergy    seasonal  . CAD (coronary artery disease) 08/25/2011  . Depression   . Hyperlipidemia     . Hypertension   . Sleep apnea    did use cpap about 10 years ago no use now   Past Surgical History:  Procedure Laterality Date  . COLONOSCOPY  10-22-11   3 polyps  . FINGER NEUROPLASTY     left index finger  . left achillies  2003  . POLYPECTOMY      reports that he has never smoked. He has never used smokeless tobacco. He reports that he does not drink alcohol or use drugs. family history includes Diabetes in his mother; Heart attack in his brother; Hypertension in his mother. Allergies  Allergen Reactions  . Penicillins Other (See Comments)    Siblings are allergic Has patient had a PCN reaction causing immediate rash, facial/tongue/throat swelling, SOB or lightheadedness with hypotension: Unknown Has patient had a PCN reaction causing severe rash involving mucus membranes or skin necrosis: Unknown Has patient had a PCN reaction that required hospitalization: Unknown Has patient had a PCN reaction occurring within the last 10 years: Unknown If all of the above answers are "NO", then may proceed with Cephalosporin use.   Current Outpatient Medications on File Prior to Visit  Medication Sig Dispense Refill  . apixaban (ELIQUIS) 5 MG TABS tablet Take 1 tablet (5 mg total) by mouth 2 (two) times daily. 60 tablet 0  . ezetimibe (ZETIA) 10 MG tablet Take 1 tablet (10 mg total) by mouth daily. 30 tablet 0  . lisinopril (PRINIVIL,ZESTRIL) 5 MG tablet Take 1 tablet (5 mg total) by mouth daily. 30 tablet 0  .  metoprolol succinate (TOPROL-XL) 100 MG 24 hr tablet Take 1 tablet (100 mg total) by mouth daily. Take with or immediately following a meal. 30 tablet 0  . montelukast (SINGULAIR) 10 MG tablet Take 1 tablet (10 mg total) by mouth daily. 90 tablet 3  . triamcinolone (NASACORT AQ) 55 MCG/ACT AERO nasal inhaler Place 2 sprays into the nose daily. (Patient taking differently: Place 2 sprays into the nose daily as needed (congestion). ) 1 Inhaler 12   No current facility-administered  medications on file prior to visit.    Review of Systems  Constitutional: Negative for other unusual diaphoresis or sweats HENT: Negative for ear discharge or swelling Eyes: Negative for other worsening visual disturbances Respiratory: Negative for stridor or other swelling  Gastrointestinal: Negative for worsening distension or other blood Genitourinary: Negative for retention or other urinary change Musculoskeletal: Negative for other MSK pain or swelling Skin: Negative for color change or other new lesions Neurological: Negative for worsening tremors and other numbness  Psychiatric/Behavioral: Negative for worsening agitation or other fatigue All other system neg per pt    Objective:   Physical Exam BP 118/76   Pulse 70   Temp 97.8 F (36.6 C) (Oral)   Ht 6' (1.829 m)   Wt 204 lb (92.5 kg)   SpO2 97%   BMI 27.67 kg/m  VS noted, not ill appearing Constitutional: Pt appears in NAD HENT: Head: NCAT.  Right Ear: External ear normal.  Left Ear: External ear normal.  Eyes: . Pupils are equal, round, and reactive to light. Conjunctivae and EOM are normal Nose: without d/c or deformity Neck: Neck supple. Gross normal ROM Cardiovascular: Normal rate and regular rhythm.   Pulmonary/Chest: Effort normal and breath sounds without rales or wheezing.  Neurological: Pt is alert. At baseline orientation, motor grossly intact except for 4+ 5 RUE motor 1+ red, tender swelling DIP index finger right hand, also MCP of right great toe Skin: Skin is warm. No rashes, other new lesions, no LE edema Psychiatric: Pt behavior is normal without agitation  No other exam findings Lab Results  Component Value Date   WBC 9.0 01/07/2018   HGB 15.7 01/07/2018   HCT 46.7 01/07/2018   PLT 161 01/07/2018   GLUCOSE 120 (H) 01/05/2018   CHOL 250 (H) 01/06/2018   TRIG 86 01/06/2018   HDL 41 01/06/2018   LDLDIRECT 146.2 08/30/2011   LDLCALC 192 (H) 01/06/2018   ALT 31 01/05/2018   AST 31 01/05/2018     NA 142 01/05/2018   K 3.6 01/05/2018   CL 110 01/05/2018   CREATININE 1.10 01/05/2018   BUN 17 01/05/2018   CO2 18 (L) 01/05/2018   TSH 1.077 01/06/2018   PSA 1.90 11/01/2017   INR 0.98 01/05/2018   HGBA1C 5.5 01/06/2018          Assessment & Plan:

## 2018-01-16 NOTE — Assessment & Plan Note (Signed)
Very mild, to avoid nsaids on eliquis, for colchicine prn

## 2018-01-16 NOTE — Telephone Encounter (Signed)
Should be ok as he will likely only take twice per year as needed

## 2018-01-16 NOTE — Assessment & Plan Note (Signed)
BP Readings from Last 3 Encounters:  01/16/18 118/76  01/07/18 (!) 142/79  11/01/17 116/68  well controlled, cont same tx

## 2018-01-16 NOTE — Assessment & Plan Note (Signed)
In light of acute cva, to restart the lipitor he has not been taking this past wk, and cont zetia as well

## 2018-01-16 NOTE — Assessment & Plan Note (Signed)
With atenolol and dilt changed to metoprolol, also on eliquis for prevention, tolerating meds, no overt bleeding, to cont same tx, f/u cards as planned

## 2018-01-16 NOTE — Assessment & Plan Note (Addendum)
Improved clinically, I reviewed Dr Leonie Man note 7/22 with family and I am convinced he meant for eliquis as secondary stroke prevention only; to d/c asa 325, to cont eliquis and restart the statin which apparently was inadvertantly advised pt to stop by nursing staff at pt d/c (she had marked it off with pen), f/u PT and neuro as planned  Note:  Total time for pt hx, exam, review of record with pt in the room, determination of diagnoses and plan for further eval and tx is > 40 min, with over 50% spent in coordination and counseling of patient including the differential dx, tx, further evaluation and other management of acute stroke, afib, HTN, acute gout, HLD, hyperglycemia

## 2018-01-16 NOTE — Patient Instructions (Addendum)
OK to stop the aspirin 325 mg  Please take all new medication as prescribed - the lipitor (restart)  Please take all new medication as prescribed - the colchicine as needed for gout  Please continue all other medications as before, and refills have been done if requested.  Please have the pharmacy call with any other refills you may need.  Please continue your efforts at being more active, low cholesterol diet, and weight control.  Please keep your appointments with your specialists as you may have planned - cardiology and neurology as planned

## 2018-01-17 ENCOUNTER — Other Ambulatory Visit: Payer: Self-pay | Admitting: *Deleted

## 2018-01-17 NOTE — Patient Outreach (Addendum)
[facility]) Care Management  01/17/2018  Richard [name] 1956/04/10 [id number]   EMMI-  stroke                                                   RED ON EMMI ALERT Day # 9 Date: 01/16/18 Red Alert Reason: feeling worse overall? yes Issued stroke-preventing secondary stroke   Outreach attempt # 1 unsuccessful at his mobile number listed for EMMI And a Successful contact made at the home number  Patient is able to verify HIPAA [place] Management RN reviewed and addressed red alertwith patient  Richard [name] report the answer yes was incorrect He had put the phone down to find something to write with and responded yes thinking EMMI had asked him a question to required a yes response He states :" Actually I am getting better each day.   He is scheduled to start neuro rehab on 01/20/18   Conditions cerebral infarction, CHF, Afib, HTN, hyperlipidemia, alcohol dependence, OSA, major depressive disorder, low back pain, allergies  Richard [name] voiced his appreciation of information reviewed, discussed and services rendered during this call   Reminded Richard [name] of the further low sodium diet and stroke rehab information EMMI education CM sent to his verified e-mail  Advised patient that there will be further automated EMMI-post discharge calls to assess how the patient is doing following the recent hospitalization Advised the patient that another call may be received from a nurse if any of their responses were abnormal. Patient voiced understanding and was appreciative of f/u call.  Plan: [facility] RN CM will follow up within 1 weeks for EMMI stroke and plan for case closure   Landa Mullinax L. Lavina Hamman, RN, BSN, CCM [facility] Telephonic Care Management Care Coordinator Direct number [phone]  Main [facility] number [phone] Fax number [phone]

## 2018-01-17 NOTE — Telephone Encounter (Signed)
Advised tessa/pharmacist of dr Gwynn Burly note

## 2018-01-20 ENCOUNTER — Other Ambulatory Visit: Payer: Self-pay

## 2018-01-20 ENCOUNTER — Ambulatory Visit: Payer: Federal, State, Local not specified - PPO | Admitting: Nurse Practitioner

## 2018-01-20 ENCOUNTER — Encounter: Payer: Self-pay | Admitting: Occupational Therapy

## 2018-01-20 ENCOUNTER — Encounter: Payer: Self-pay | Admitting: Nurse Practitioner

## 2018-01-20 ENCOUNTER — Ambulatory Visit: Payer: Federal, State, Local not specified - PPO | Admitting: Speech Pathology

## 2018-01-20 ENCOUNTER — Ambulatory Visit: Payer: Federal, State, Local not specified - PPO | Attending: Internal Medicine | Admitting: Occupational Therapy

## 2018-01-20 VITALS — BP 130/80 | HR 132 | Ht 72.0 in | Wt 203.4 lb

## 2018-01-20 DIAGNOSIS — R41841 Cognitive communication deficit: Secondary | ICD-10-CM | POA: Insufficient documentation

## 2018-01-20 DIAGNOSIS — R471 Dysarthria and anarthria: Secondary | ICD-10-CM

## 2018-01-20 DIAGNOSIS — R4701 Aphasia: Secondary | ICD-10-CM

## 2018-01-20 DIAGNOSIS — M6281 Muscle weakness (generalized): Secondary | ICD-10-CM | POA: Insufficient documentation

## 2018-01-20 DIAGNOSIS — I4891 Unspecified atrial fibrillation: Secondary | ICD-10-CM | POA: Diagnosis not present

## 2018-01-20 DIAGNOSIS — R0683 Snoring: Secondary | ICD-10-CM

## 2018-01-20 DIAGNOSIS — R278 Other lack of coordination: Secondary | ICD-10-CM | POA: Insufficient documentation

## 2018-01-20 LAB — BASIC METABOLIC PANEL
BUN/Creatinine Ratio: 14 (ref 10–24)
BUN: 17 mg/dL (ref 8–27)
CO2: 20 mmol/L (ref 20–29)
Calcium: 9.8 mg/dL (ref 8.6–10.2)
Chloride: 104 mmol/L (ref 96–106)
Creatinine, Ser: 1.21 mg/dL (ref 0.76–1.27)
GFR calc Af Amer: 73 mL/min/{1.73_m2} (ref >59)
GFR calc non Af Amer: 63 mL/min/{1.73_m2} (ref >59)
Glucose: 86 mg/dL (ref 65–99)
Potassium: 5 mmol/L (ref 3.5–5.2)
Sodium: 138 mmol/L (ref 134–144)

## 2018-01-20 LAB — CBC
Hematocrit: 48.6 % (ref 37.5–51.0)
Hemoglobin: 17.2 g/dL (ref 13.0–17.7)
MCH: 31.4 pg (ref 26.6–33.0)
MCHC: 35.4 g/dL (ref 31.5–35.7)
MCV: 89 fL (ref 79–97)
Platelets: 245 10*3/uL (ref 150–450)
RBC: 5.47 x10E6/uL (ref 4.14–5.80)
RDW: 13.6 % (ref 12.3–15.4)
WBC: 7.4 10*3/uL (ref 3.4–10.8)

## 2018-01-20 MED ORDER — DILTIAZEM HCL ER COATED BEADS 240 MG PO CP24: 240.0000 mg | ORAL_CAPSULE | Freq: Every day | ORAL | 3 refills | Status: DC

## 2018-01-20 NOTE — Patient Instructions (Addendum)
  Coordination Activities  Perform the following activities for 20-30 minutes 1 times per day with right hand(s).   Rotate ball in fingertips (clockwise and counter-clockwise).  Toss ball between hands.  Toss ball in air and catch with the same hand.  Flip cards 1 at a time as fast as you can.  Deal cards with your thumb (Hold deck in hand and push card off top with thumb).  Shuffle cards.  Pick up coins and place in container or coin bank, try not to drag.  Pick up coins and stack.  Pick up coins one at a time until you get 5-10 in your hand, then move coins from palm to fingertips to stack one at a time.

## 2018-01-20 NOTE — Therapy (Signed)
[facility] [address] [place], [place], [address] Phone: [phone]   Fax:  [phone]  Speech Language Pathology Evaluation  Patient Details  Name: Richard [name] MRN: [id number] Date of Birth: 1956/04/17 Referring Provider: Shelly Coss, MD    Encounter Date: 01/20/2018  End of Session - 01/20/18 1743    Visit Number  1    Number of Visits  1    Date for SLP Re-Evaluation  01/20/18    SLP Start Time  7619    SLP Stop Time   1532    SLP Time Calculation (min)  36 min    Activity Tolerance  Patient tolerated treatment well       Past Medical History:  Diagnosis Date  . Alcoholism in remission ([place]) 08/25/2011  . Allergy    seasonal  . CAD (coronary artery disease) 08/25/2011  . Depression   . Hyperlipidemia   . Hypertension   . Sleep apnea    did use cpap about 10 years ago no use now    Past Surgical History:  Procedure Laterality Date  . COLONOSCOPY  10-22-11   3 polyps  . FINGER NEUROPLASTY     left index finger  . left achillies  2003  . POLYPECTOMY      There were no vitals filed for this visit.  Subjective Assessment - 01/20/18 1456    Subjective  "I was starting to put sent-ing, uh sentences, together"    Patient is accompained by:  Family member wife Webb Silversmith    Currently in Pain?  No/denies         SLP Evaluation [facility] - 01/20/18 1456      SLP Visit Information   SLP Received On  01/20/18    Referring Provider  Shelly Coss, MD     Onset Date  01/05/18    Medical Diagnosis  acute ischemic stroke      General Information   HPI  62 yo admitted with dysarthria and RUE weakness with MRI demonstrating left parietotemporal infact with new Afib. PMhx: HTN, HLD, sleep apnea. ST in acute evaluation with findings of mild expressive aphasia and mild dysarthria.     Behavioral/Cognition  alert, cooperative      Balance Screen   Has the patient fallen in the past 6 months  Yes    How many  times?  1 time of CVA      Prior Functional Status   Cognitive/Linguistic Baseline  Within functional limits    Type of Home  House     Lives With  Spouse    Available Support  Family    Vocation  Retired      Pain Assessment   Pain Assessment  No/denies pain      Cognition   Overall Cognitive Status  Within Functional Limits for tasks assessed    Attention  Focused;Sustained;Selective;Alternating    Focused Attention  Appears intact    Sustained Attention  Appears intact    Selective Attention  Appears intact    Alternating Attention  Appears intact    Awareness  Appears intact    Problem Solving  Appears intact    Executive Function  Reasoning;Organizing;Decision Making    Reasoning  Appears intact    Organizing  Appears intact    Decision Making  Appears intact      Auditory Comprehension   Overall Auditory Comprehension  Appears within functional limits for tasks assessed    Yes/No Questions  Within  Functional Limits    Commands  Within Functional Limits    Conversation  Moderately complex      Visual Recognition/Discrimination   Discrimination  Within Function Limits      Reading Comprehension   Reading Status  Within funtional limits complex paragraph level      Expression   Primary Mode of Expression  Verbal      Verbal Expression   Overall Verbal Expression  Appears within functional limits for tasks assessed    Initiation  No impairment    Automatic Speech  Name;Social Response    Level of Generative/Spontaneous Verbalization  Conversation    Repetition  No impairment    Naming  No impairment    Other Naming Comments  2 phonemic paraphasias immediately self-corrected    Pragmatics  No impairment    Non-Verbal Means of Communication  Not applicable      Written Expression   Dominant Hand  Right    Written Expression  Within Functional Limits sentence level; limited by motor impairments; language [facility]      Oral Motor/Sensory Function   Overall Oral  Motor/Sensory Function  Appears within functional limits for tasks assessed      Motor Speech   Overall Motor Speech  Appears within functional limits for tasks assessed    Respiration  Within functional limits    Phonation  Normal wife reports decreased vocal intensity when pt is tired    Resonance  Within functional limits    Articulation  Within functional limitis    Intelligibility  Intelligible    Word  75-100% accurate    Phrase  75-100% accurate    Sentence  75-100% accurate    Conversation  75-100% accurate    Motor Planning  Witnin functional limits    Motor Speech Errors  Not applicable    Phonation  WFL      Standardized Assessments   Standardized Assessments   Montreal Cognitive Assessment (MOCA);[place] Naming Test-2nd edition    Montreal Cognitive Assessment ([place])   27/30 WNL    [place] Naming Test-2nd edition   58/60 WNL                      SLP Education - 01/20/18 1743    Education Details  SLOP strategies for dysarthria, cognitive-linguistic activities for home, compensations for anomia    Person(s) Educated  Patient;Spouse    Methods  Explanation;Handout    Comprehension  Verbalized understanding         SLP Long Term Goals - 01/20/18 1744      SLP LONG TERM GOAL #1   Title  n/a       Plan - 01/20/18 1744    Clinical Impression Statement  Mr. Grizzle cognitive-linguistic abilities are within functional limits for tasks assessed. Pt and wife report he is close to his baseline. Expressive and receptive language are intact for mod complex-complex conversation, reading and writing are [facility] [facility] (portions of RCBA and supplemental writing tasks). Pt scored 58/60 on [place], and 27/30 on MOCA- Basic, which are considered within normal limits. He had 2 paraphasias (one in naming and one during initial conversation), both of which he immediately corrected. Speech is fluent and without dysarthria, though pt/wife report mildly decreased vocal  intensity when pt is fatigued. He is 100% intelligible in conversation today. SLP provided education re: compensations for dysarthria, anomia and activities to support cognition at home. Pt/wife report pt is able to communicate and perform  cognitive tasks at home without difficulty. No further skilled ST is recommended at this time and pt/wife in agreement.    Speech Therapy Frequency  One time visit    Treatment/Interventions  SLP instruction and feedback;Patient/family education    Consulted and Agree with Plan of Care  Patient;Family member/caregiver       Patient will benefit from skilled therapeutic intervention in order to improve the following deficits and impairments:   Aphasia  Cognitive communication deficit  Dysarthria and anarthria    Problem List Patient Active Problem List   Diagnosis Date Noted  . Acute gouty arthritis 01/16/2018  . Hyperglycemia 01/16/2018  . Acute ischemic stroke ([place])   . Stroke ([place]) 01/06/2018  . Atrial fibrillation ([place]) 01/06/2018  . Cough 10/18/2016  . Wheezing 10/18/2016  . Allergic rhinitis 08/31/2014  . Recurrent low back pain 10/15/2012  . Depression 08/30/2011  . Preventative health care 08/30/2011  . CAD (coronary artery disease) 08/25/2011  . Alcoholism in remission ([place]) 08/25/2011  . Hyperlipidemia   . Hypertension   . CHEST PAIN 08/30/2010   Deneise Lever, [place], [place] Speech-Language Pathologist  Aliene Altes 01/20/2018, 5:52 PM  [facility] [address] [place], [place], [address] Phone: [phone]   Fax:  [phone]  Name: Camden Knotek MRN: [id number] Date of Birth: 1955/12/17

## 2018-01-20 NOTE — Patient Instructions (Addendum)
S   Slow L   Loud O  Overarticulate P   Pause   Cognitive Activities you can do at home:   - Solitaire  - [place]ong  - Scrabble  - Chess/Checkers  - Crosswords (easy level)  - [place]  On your computer, tablet or phone: TalkPath Therapy (free activities for language and cognition) Constant Therapy (free trial period, then subscription-based) BrainHQ Brainbashers.[employer] Sport and exercise psychologist Game App [employer] IQ Logic [place] Sort it out (easy) Photo Quiz  - what's the word Mix 2 Words Spot the difference games   Tips for Talking with People who have Aphasia  . Say one thing at a time . Don't  rush - slow down, be patient . Talk face to face . Reduce background noise . Relax - be natural . Use pen and paper . Write down key words . Draw diagrams or pictures . Don't pretend you understand . Ask what helps . Recap - check you both understand . Be a partner, not a therapist   Aphasia does not affect intelligence, only language. The person with aphasia can still: make decisions, have opinions, and socialize.   Describing words  What group does it belong to?  What do I use it for?  Where can I find it?  What does it LOOK like?  What other words go with it?  What is the 1st sound of the word?

## 2018-01-20 NOTE — Progress Notes (Signed)
CARDIOLOGY OFFICE NOTE  Date:  01/20/2018    Richard [name] Date of Birth: Jul 06, 1955 Medical Record #[id number]  PCP:  Richard Borg, MD  Cardiologist:  Richard [name]    Chief Complaint  Patient presents with  . Atrial Fibrillation    Post [name] visit - seen for Dr. Marlou [name]    History of Present Illness: Richard [name] is a 62 y.o. male who presents today for a post [name]/TOC 14 visit.  Former patient of Dr. Doug [name] - not seen since 2012.   He has a history of hypertension, hyperlipidemia, & sleep apnea. He has had a prior cardiac CT showing proximal LAD disease - not obstructive in 2012 (less than 40%).   Presented last month to the ER with dysarthria and right upper extremity weakness. He was also found to be in A. fib with RVR on presentation. MRI brain was performed which revealed small volume acute ischemic cortical infarct involving the left parietal temporal region, no hemorrhage transformation or mass effect noted. Neurology was consulted. Cardiology also consulted  for new onset A. fib with RVR.  His echocardiogram also showed reduced ejection fraction to 40 to 45%.  Medications were adjusted.  Weakness on his right upper extremity improved.   Comes in today. Here with his wife. They have lots of questions. Need review of the echo results. Unsure about the "plan" going forward. Anxious to resume his activities. Wondering if they need to see a dietician. Asking about sleep study. FH + for CAD.   He is doing very well. Very little deficit from the stroke. Was off the statin for about 9 days - says the nurse told him "not to take this" at the time of his discharge - so he did not. Saw PCP last week and this was resumed. Asking about aspirin - he is on Eliquis -  No missed doses. HR log from home shows HR in the 60's - using a FitBit - however HR here today is quite high and does not correlate at all. No chest pain. Breathing is good. Not dizzy or lightheaded. Asking about sleep  study. Does have trouble staying asleep - some snoring - wife notes this really improved with cessation of alcohol over the past 10 to 12 years. Not really using that much caffeine. He has no awareness of his AF - no palpitations.   Past Medical History:  Diagnosis Date  . Alcoholism in remission ([place]) 08/25/2011  . Allergy    seasonal  . CAD (coronary artery disease) 08/25/2011  . Depression   . Hyperlipidemia   . Hypertension   . Sleep apnea    did use cpap about 10 years ago no use now    Past Surgical History:  Procedure Laterality Date  . COLONOSCOPY  10-22-11   3 polyps  . FINGER NEUROPLASTY     left index finger  . left achillies  2003  . POLYPECTOMY       Medications: Current Meds  Medication Sig  . apixaban (ELIQUIS) 5 MG TABS tablet Take 1 tablet (5 mg total) by mouth 2 (two) times daily.  Marland Kitchen aspirin 325 MG tablet Take 325 mg by mouth daily.  Marland Kitchen atorvastatin (LIPITOR) 80 MG tablet Take 1 tablet (80 mg total) by mouth daily.  . colchicine 0.6 MG tablet 1 tab by mouth every hour until pain improved or diarrhea  . ezetimibe (ZETIA) 10 MG tablet Take 1 tablet (10 mg total) by mouth daily.  Marland Kitchen lisinopril (PRINIVIL,ZESTRIL)  5 MG tablet Take 1 tablet (5 mg total) by mouth daily.  . metoprolol succinate (TOPROL-XL) 100 MG 24 hr tablet Take 1 tablet (100 mg total) by mouth daily. Take with or immediately following a meal.  . montelukast (SINGULAIR) 10 MG tablet Take 1 tablet (10 mg total) by mouth daily.  Marland Kitchen triamcinolone (NASACORT AQ) 55 MCG/ACT AERO nasal inhaler Place 2 sprays into the nose daily. (Patient taking differently: Place 2 sprays into the nose daily as needed (congestion). )     Allergies: Allergies  Allergen Reactions  . Penicillins Other (See Comments)    Siblings are allergic Has patient had a PCN reaction causing immediate rash, facial/tongue/throat swelling, SOB or lightheadedness with hypotension: Unknown Has patient had a PCN reaction causing severe rash  involving mucus membranes or skin necrosis: Unknown Has patient had a PCN reaction that required hospitalization: Unknown Has patient had a PCN reaction occurring within the last 10 years: Unknown If all of the above answers are "NO", then may proceed with Cephalosporin use.    Social History: The patient  reports that he has never smoked. He has never used smokeless tobacco. He reports that he does not drink alcohol or use drugs.   Family History: The patient's family history includes Diabetes in his mother; Heart attack in his brother; Hypertension in his mother.   Review of Systems: Please see the history of present illness.   Otherwise, the review of systems is positive for none.   All other systems are reviewed and negative.   Physical Exam: VS:  BP 130/80 (BP Location: Left Arm, Patient Position: Sitting, Cuff Size: Normal)   Pulse (!) 132   Ht 6' (1.829 m)   Wt 203 lb 6.4 oz (92.3 kg)   BMI 27.59 kg/m  .  BMI Body mass index is 27.59 kg/m.  Wt Readings from Last 3 Encounters:  01/20/18 203 lb 6.4 oz (92.3 kg)  01/16/18 204 lb (92.5 kg)  01/05/18 210 lb 5.1 oz (95.4 kg)    General: Pleasant. Well developed, well nourished and in no acute distress.   HEENT: Normal.  Neck: Supple, no JVD, carotid bruits, or masses noted.  Cardiac: Irregular irregular rhythm. His rate is fast.  No murmurs, rubs, or gallops. No edema.  Respiratory:  Lungs are clear to auscultation bilaterally with normal work of breathing.  GI: Soft and nontender.  MS: No deformity or atrophy. Gait and ROM intact.  Skin: Warm and dry. Color is normal.  Neuro:  Strength and sensation are intact and no gross focal deficits noted.  Psych: Alert, appropriate and with normal affect.   LABORATORY DATA:  EKG:  EKG is ordered today. This demonstrates AF with RVR. Reviewed with Dr. Rayann [name] (DOD)  Lab Results  Component Value Date   WBC 9.0 01/07/2018   HGB 15.7 01/07/2018   HCT 46.7 01/07/2018   PLT 161  01/07/2018   GLUCOSE 120 (H) 01/05/2018   CHOL 250 (H) 01/06/2018   TRIG 86 01/06/2018   HDL 41 01/06/2018   LDLDIRECT 146.2 08/30/2011   LDLCALC 192 (H) 01/06/2018   ALT 31 01/05/2018   AST 31 01/05/2018   NA 142 01/05/2018   K 3.6 01/05/2018   CL 110 01/05/2018   CREATININE 1.10 01/05/2018   BUN 17 01/05/2018   CO2 18 (L) 01/05/2018   TSH 1.077 01/06/2018   PSA 1.90 11/01/2017   INR 0.98 01/05/2018   HGBA1C 5.5 01/06/2018     BNP (last 3 results) No  results for input(s): BNP in the last 8760 hours.  ProBNP (last 3 results) No results for input(s): PROBNP in the last 8760 hours.   Other Studies Reviewed Today:  Echo 01/06/18: Study Conclusions - Left ventricle: The cavity size was normal. Systolic function was mildly to moderately reduced. The estimated ejection fraction was in the range of 40% to 45%. Moderate diffuse hypokinesis with no identifiable regional variations. - Aortic valve: There was mild regurgitation. - Left atrium: The atrium was mildly dilated. - Right atrium: The atrium was mildly dilated. - Pulmonary arteries: PA peak pressure: 31 mm Hg (S).    Assessment/Plan:  1. AF with RVR - he is on 100 mg of Toprol - rate is not controlled - discussed with Dr. Rayann [name] - adding back Diltiazem 240 mg a day. See back next week. Primary goal is control HR - hopefully restore NSR and arrange cardioversion after 4 weeks of anticoagulation. Lab today. CHADSVASC is at least 4. Will arrange sleep study.   2. HTN - BP ok on current regimen.   3. Recent stroke - seems to have had nice recovery. Seeing neuro later today. Stopping aspirin since he is on full anticoagulation with Eliquis. No bleeding noted.   4. New LV systolic HF - EF of 40 to 45% - unclear how much of this is tachy mediated. Would plan to recheck echo after HR is controlled/restoration of NSR. He is on low dose ACE with beta blocker.   5. Known CAD - I would plan for repeat coronary CT once we  get his HR more stablizied.   6. HLD - back on statin along with the Zetia.   Current medicines are reviewed with the patient today.  The patient does not have concerns regarding medicines other than what has been noted above.  The following changes have been made:  See above.  Labs/ tests ordered today include:    Orders Placed This Encounter  Procedures  . Basic metabolic panel  . CBC  . EKG 12-Lead     Disposition:   This plan of care was reviewed with Dr. Rayann [name] who is in agreement. FU with me in one week with repeat EKG.   Patient is agreeable to this plan and will call if any problems develop in the interim.   SignedTruitt Merle, NP  01/20/2018 11:06 AM  [facility] [address] [place], [place]  [address] Phone: [phone] Fax: [phone]

## 2018-01-20 NOTE — Patient Instructions (Addendum)
We will be checking the following labs today - BMET and CBC   Medication Instructions:    Continue with your current medicines. BUT  We are stopping aspirin  We are adding Diltiazem 240 mg - to take once a day - this has been sent to your pharmacy      Testing/Procedures To Be Arranged:  Sleep study referral  Follow-Up:   See me in one week with EKG    Other Special Instructions:   Needs sleep study  We will plan on ordering a cardiac CT when I see you back  We will plan on arranging cardioversion when I see you back    If you need a refill on your cardiac medications before your next appointment, please call your pharmacy.   Call the [place] office at [phone] if you have any questions, problems or concerns.

## 2018-01-21 NOTE — Therapy (Signed)
[facility] [address] [place], [place], [address] Phone: [phone]   Fax:  [phone]  Occupational Therapy Evaluation  Patient Details  Name: Richard [name] MRN: [id number] Date of Birth: [birth year] Referring Provider: Dr Shelly Coss   Encounter Date: 01/20/2018  OT End of Session - 01/21/18 1319    Visit Number  1    Number of Visits  7    Date for OT Re-Evaluation  03/07/18    Authorization Type  Federal [employer] 75 visit limit PT, OT, ST with 0 used    Authorization - Visit Number  1    Authorization - Number of Visits  10    OT Start Time  1400    OT Stop Time  1445    OT Time Calculation (min)  45 min    Activity Tolerance  Patient tolerated treatment well    Behavior During Therapy  [facility] for tasks assessed/performed       Past Medical History:  Diagnosis Date  . Alcoholism in remission ([place]) 08/25/2011  . Allergy    seasonal  . CAD (coronary artery disease) 08/25/2011  . Depression   . Hyperlipidemia   . Hypertension   . Sleep apnea    did use cpap about 10 years ago no use now    Past Surgical History:  Procedure Laterality Date  . COLONOSCOPY  10-22-11   3 polyps  . FINGER NEUROPLASTY     left index finger  . left achillies  2003  . POLYPECTOMY      There were no vitals filed for this visit.  Subjective Assessment - 01/20/18 1409    Subjective   pt reports that he is just taking things slow    Patient is accompained by:  Family member wife    Pertinent History  CVA 01/05/18.  PMH:  hyperlipidemia, HTN, CAD, alcoholism in remission, depression, low back pain, a-fib, hyperglycemia    Patient Stated Goals  return to prior activities including driving     Currently in Pain?  No/denies        [facility] OT Assessment - 01/21/18 0001      Assessment   Medical Diagnosis  CVA    Referring Provider  Dr Shelly Coss    Onset Date/Surgical Date  01/05/18    Hand Dominance  Right    Prior Therapy   hospitalized 01/05/18-01/07/18      Precautions   Precautions  None      Balance Screen   Has the patient fallen in the past 6 months  Yes    How many times?  1 time of CVA      Home  Environment   Family/patient expects to be discharged to:  Private residence    Lives With  Spouse      Prior Function   Level of [place]  Retired    Leisure  fish, golf, Technical brewer, garden, walk 3 dogs      IADL   Prior Level of Function Light Housekeeping  independent, shares with wife hasn't attempted yard work    Nurse, adult  -- independent    Prior Level of Function Meal Prep  likes to [employer], hasn't yet attempted much    [employer]  -- not returned to driving yet      Mobility   Mobility Status  Independent      Written Expression   Dominant Hand  Right  Handwriting  75% legible;Increased time      Vision - History   Baseline Vision  Wears glasses only for reading    Additional Comments  pt denies changes to vision      Activity Tolerance   Activity Tolerance Comments  fatigue after 3-4 hours      Cognition   Overall Cognitive Status  Within Functional Limits for tasks assessed      Sensation   Light Touch  Appears Intact    Additional Comments  initial numbness, now resolved      Coordination   9 Hole Peg Test  Right;Left    Right 9 Hole Peg Test  57.62    Left 9 Hole Peg Test  23.25      AROM   Overall AROM   Within functional limits for tasks performed    Overall AROM Comments  BUEs      Strength   Overall Strength  Within functional limits for tasks performed    Overall Strength Comments  BUEs       Hand Function   Right Hand Grip (lbs)  80    Left Hand Grip (lbs)  80                      OT Education - 01/21/18 1757    Education Details  Coordination HEP; recommended no driving until he receives clearance/clarification from MD; recommended avoiding use of power tools at this time but could  perform sanding/staining type tasks also recommended avoiding walking larger dogs that pull    Person(s) Educated  Patient;Spouse    Methods  Explanation;Demonstration;Verbal cues;Handout    Comprehension  Verbalized understanding          OT Long Term Goals - 01/21/18 1827      OT LONG TERM GOAL #1   Title  Pt will be independent with HEP.--check goals 03/06/18    Time  6    Period  Weeks    Status  New      OT LONG TERM GOAL #2   Title  Pt will improve coordination for ADLs/IADLs and leisure tasks as shown by improving time on 9-hole peg test by at least 20sec.    Baseline  57.62sec     Time  6    Period  Weeks    Status  New      OT LONG TERM GOAL #3   Title  Pt will be able to write 3 sentences with 100% legibility.    Baseline  75%    Time  6    Period  Weeks    Status  New      OT LONG TERM GOAL #4   Title  Pt will report ability to perform mod complex cooking tasks mod I.    Time  6    Period  Weeks    Status  New            Plan - 01/21/18 1800    Clinical Impression Statement  Pt is a 62 y.o. male s/p CVA.  Pt presented to the hospital with dysarthria and RUE weakness.  Pt with PMH that includes:  hyperlipidemia, HTN, CAD, alcoholism in remission, depression, low back pain, new a-fib, hyperglycemia.  Pt presents with decr coordination and decr activity tolerance.   Pt would benefit from occupational therapy to improve dominant RUE functional use, improve ADL and IADL performance, and to be able to resume leisure tasks.  Occupational Profile and client history currently impacting functional performance  Pt was independent prior to CVA and enjoyed gardening, Fish farm manager, cooking, fishing, and playing golf.  Pt has not yet returned to all these activities, but is able to perform BADLs mod I.      Occupational performance deficits (Please refer to evaluation for details):  ADL's;IADL's;Leisure;Social Participation    Rehab Potential  Good    OT  Frequency  1x / week    OT Duration  6 weeks +eval    OT Treatment/Interventions  Self-care/ADL training;Cryotherapy;Therapeutic exercise;DME and/or AE instruction;Functional Mobility Training;Manual Therapy;Neuromuscular education;Fluidtherapy;Moist Heat;Energy conservation;Therapeutic activities;Patient/family education    Plan  review coordination HEP and update prn, simulated simple woodworking or gardenting tasks    Clinical Decision Making  Limited treatment options, no task modification necessary    Consulted and Agree with Plan of Care  Patient;Family member/caregiver    Family Member Consulted  wife       Patient will benefit from skilled therapeutic intervention in order to improve the following deficits and impairments:  Decreased activity tolerance, Decreased coordination, Impaired UE functional use, Decreased endurance  Visit Diagnosis: Other lack of coordination  Muscle weakness (generalized)    Problem List Patient Active Problem List   Diagnosis Date Noted  . Acute gouty arthritis 01/16/2018  . Hyperglycemia 01/16/2018  . Acute ischemic stroke ([place])   . Stroke ([place]) 01/06/2018  . Atrial fibrillation ([place]) 01/06/2018  . Cough 10/18/2016  . Wheezing 10/18/2016  . Allergic rhinitis 08/31/2014  . Recurrent low back pain 10/15/2012  . Depression 08/30/2011  . Preventative health care 08/30/2011  . CAD (coronary artery disease) 08/25/2011  . Alcoholism in remission ([place]) 08/25/2011  . Hyperlipidemia   . Hypertension   . CHEST PAIN 08/30/2010    [facility] 01/21/2018, 6:45 PM  [facility] [address] [place], [place], [address] Phone: [phone]   Fax:  [phone]  Name: Richard [name] MRN: [id number] Date of Birth: 01-07-1956   Vianne Bulls, OTR/L [facility] [address] [place], [place]  [address] [phone] phone [phone] 01/21/18 6:46  PM

## 2018-01-22 ENCOUNTER — Telehealth: Payer: Self-pay | Admitting: *Deleted

## 2018-01-22 NOTE — Telephone Encounter (Signed)
Faxed clinical notes to [facility] for sleep study PA request.

## 2018-01-22 NOTE — Telephone Encounter (Signed)
-----   Message from Tamsen Snider sent at 01/20/2018 11:12 AM EDT ----- Pt was seen by Truitt Merle, NP today and needs to be set up for a sleep study please.  Paperwork filled out will give to Gershon Cull.  Thanks danielle

## 2018-01-23 ENCOUNTER — Ambulatory Visit: Payer: Self-pay | Admitting: *Deleted

## 2018-01-24 ENCOUNTER — Other Ambulatory Visit: Payer: Self-pay | Admitting: *Deleted

## 2018-01-24 NOTE — Patient Outreach (Signed)
[facility]) Care Management  01/24/2018  Richard [name] [birth year] [id number]   EMMI-stroke RED ON EMMI ALERT Day #9 Date:01/16/18 Red Alert Reason:feeling worse overall? yes Issued stroke-preventing secondary stroke   Outreach attempt #1 unsuccessful at his mobile number listed for EMMI And a Successful contact made at the home number  Patient is able to verify HIPAA [place] Management RN discussed this follow and closure call for EMMI stroke Richard [name] reports he is doing very well and improving day by day He is presently doing his PT/OT ball hand exercises   Richard [name] report he started neuro rehab on 01/20/18 and has completed ST and was discharged for ST He is scheduled for one more OT session and then will continue therapy exercises at home  He confirms he is to have a sleep apnea test Previously had a CPAP he did not use related to he did not think it was as beneficial as "a drink" Reports he has been 15 years without alcohol and beginning to sleep better and more willing to use CPAP prn  Conditions cerebral infarction, CHF, Afib, HTN, hyperlipidemia, alcohol dependence, OSA, major depressive disorder, low back pain, allergies  Richard [name] voiced his appreciation of information reviewed, discussed and services rendered during this calland previous calls.  He states initially the calls were frustrating but he now appreciates them   Advised patient that there will be further automated EMMI-post discharge calls to assess how the patient is doing following the recent hospitalization Advised the patient that another call may be received from a nurse if any of their responses were abnormal. Patient voiced understanding and was appreciative of f/u call.  Plan: [facility] RN CM reminded Richard [name] of the available professional services of [facility] and encouraged contact to CM if future services needed [facility] RN CM will  close case at this time as patient has been assessed and no needs identified.    Kimberly L. Lavina Hamman, RN, BSN, CCM [facility] Telephonic Care Management Care Coordinator Direct number [phone]  [facility] number [phone] Fax number [phone]

## 2018-01-27 ENCOUNTER — Telehealth: Payer: Self-pay | Admitting: *Deleted

## 2018-01-27 ENCOUNTER — Encounter: Payer: Self-pay | Admitting: Occupational Therapy

## 2018-01-27 ENCOUNTER — Ambulatory Visit: Payer: Federal, State, Local not specified - PPO | Admitting: Occupational Therapy

## 2018-01-27 DIAGNOSIS — R278 Other lack of coordination: Secondary | ICD-10-CM | POA: Diagnosis not present

## 2018-01-27 DIAGNOSIS — R41841 Cognitive communication deficit: Secondary | ICD-10-CM | POA: Diagnosis not present

## 2018-01-27 DIAGNOSIS — R4701 Aphasia: Secondary | ICD-10-CM | POA: Diagnosis not present

## 2018-01-27 DIAGNOSIS — M6281 Muscle weakness (generalized): Secondary | ICD-10-CM

## 2018-01-27 DIAGNOSIS — R471 Dysarthria and anarthria: Secondary | ICD-10-CM | POA: Diagnosis not present

## 2018-01-27 NOTE — Telephone Encounter (Signed)
Per Truitt Merle sleep study ordered for patient

## 2018-01-27 NOTE — Therapy (Signed)
[facility] [address] [place], [place], [address] Phone: [phone]   Fax:  [phone]  Occupational Therapy Treatment  Patient Details  Name: Richard [name] MRN: [id number] Date of Birth: 02-15-1956 Referring Provider: Dr Shelly Coss   Encounter Date: 01/27/2018  OT End of Session - 01/27/18 1326    Visit Number  2    Number of Visits  7    Date for OT Re-Evaluation  03/07/18    Authorization Type  Federal [employer] 75 visit limit PT, OT, ST with 0 used    Authorization - Visit Number  2    Authorization - Number of Visits  10    OT Start Time  1325    OT Stop Time  1406    OT Time Calculation (min)  41 min    Activity Tolerance  Patient tolerated treatment well    Behavior During Therapy  [facility] for tasks assessed/performed       Past Medical History:  Diagnosis Date  . Alcoholism in remission ([place]) 08/25/2011  . Allergy    seasonal  . CAD (coronary artery disease) 08/25/2011  . Depression   . Hyperlipidemia   . Hypertension   . Sleep apnea    did use cpap about 10 years ago no use now    Past Surgical History:  Procedure Laterality Date  . COLONOSCOPY  10-22-11   3 polyps  . FINGER NEUROPLASTY     left index finger  . left achillies  2003  . POLYPECTOMY      There were no vitals filed for this visit.  Subjective Assessment - 01/27/18 1326    Subjective   Been doing my exercises    Patient is accompained by:  Family member   wife   Pertinent History  CVA 01/05/18.  PMH:  hyperlipidemia, HTN, CAD, alcoholism in remission, depression, low back pain, a-fib, hyperglycemia    Patient Stated Goals  return to prior activities including driving     Currently in Pain?  No/denies         Placing o'connor pegs in pegboard with tweezers with min-mod difficulty for incr coordination/control.  Discussed difficulty with grading pressure for coordination and activities to help with this.--see pt  instructions.  Practiced writing with approx 75% legibility and difficulty making letter connections, then practiced loops, continuous l and n.   Distal finger control sheet and coloring simple shapes with min difficulty/cues.  Trial and issued tan foam grip to decr pressure on pen.         OT Education - 01/27/18 1451    Education Details  Added to HEP--see pt instructions    Person(s) Educated  Patient    Methods  Explanation;Demonstration;Verbal cues;Handout    Comprehension  Verbalized understanding          OT Long Term Goals - 01/21/18 1827      OT LONG TERM GOAL #1   Title  Pt will be independent with HEP.--check goals 03/06/18    Time  6    Period  Weeks    Status  New      OT LONG TERM GOAL #2   Title  Pt will improve coordination for ADLs/IADLs and leisure tasks as shown by improving time on 9-hole peg test by at least 20sec.    Baseline  57.62sec     Time  6    Period  Weeks    Status  New      OT  LONG TERM GOAL #3   Title  Pt will be able to write 3 sentences with 100% legibility.    Baseline  75%    Time  6    Period  Weeks    Status  New      OT LONG TERM GOAL #4   Title  Pt will report ability to perform mod complex cooking tasks mod I.    Time  6    Period  Weeks    Status  New            Plan - 01/27/18 1327    Clinical Impression Statement  Pt is progressing towards goals with improving coordination.    Occupational Profile and client history currently impacting functional performance  Pt was independent prior to CVA and enjoyed gardening, Fish farm manager, cooking, fishing, and playing golf.  Pt has not yet returned to all these activities, but is able to perform BADLs mod I.      Occupational performance deficits (Please refer to evaluation for details):  ADL's;IADL's;Leisure;Social Participation    Rehab Potential  Good    OT Frequency  1x / week    OT Duration  6 weeks   +eval   OT Treatment/Interventions  Self-care/ADL  training;Cryotherapy;Therapeutic exercise;DME and/or AE instruction;Functional Mobility Training;Manual Therapy;Neuromuscular education;Fluidtherapy;Moist Heat;Energy conservation;Therapeutic activities;Patient/family education    Plan  review coordination HEP and update prn, simulated simple woodworking or gardenting tasks, coordination, check writing and 9-hole peg test    Clinical Decision Making  Limited treatment options, no task modification necessary    Consulted and Agree with Plan of Care  Patient;Family member/caregiver    Family Member Consulted  wife       Patient will benefit from skilled therapeutic intervention in order to improve the following deficits and impairments:  Decreased activity tolerance, Decreased coordination, Impaired UE functional use, Decreased endurance  Visit Diagnosis: Other lack of coordination  Muscle weakness (generalized)    Problem List Patient Active Problem List   Diagnosis Date Noted  . Acute gouty arthritis 01/16/2018  . Hyperglycemia 01/16/2018  . Acute ischemic stroke ([place])   . Stroke ([place]) 01/06/2018  . Atrial fibrillation ([place]) 01/06/2018  . Cough 10/18/2016  . Wheezing 10/18/2016  . Allergic rhinitis 08/31/2014  . Recurrent low back pain 10/15/2012  . Depression 08/30/2011  . Preventative health care 08/30/2011  . CAD (coronary artery disease) 08/25/2011  . Alcoholism in remission ([place]) 08/25/2011  . Hyperlipidemia   . Hypertension   . CHEST PAIN 08/30/2010    [facility] 01/27/2018, 2:52 PM  [facility] [address] [place], [place], [address] Phone: [phone]   Fax:  [phone]  Name: Richard [name] MRN: [id number] Date of Birth: [birth year]   Vianne Bulls, OTR/L [facility] [address] [place], [place]  [address] [phone] phone [phone] 01/27/18 2:52 PM

## 2018-01-27 NOTE — Patient Instructions (Signed)
   Practice writing: 1.  Practice cursive continuous "l" and loops, and cursive "n/m" trying to work on control and avoid lifting pen off the page. 2.  Try foam grip on pen 3.  Color with long strokes 4.  Write/copy sentences    5.  Start by sanding by hand, paint with right hand 6.  Pick up things with tweezers (paperclips) 7.  Use a screwdriver   8.  Fasten/unfasten button with right hand only

## 2018-01-28 ENCOUNTER — Encounter: Payer: Self-pay | Admitting: Nurse Practitioner

## 2018-01-28 ENCOUNTER — Ambulatory Visit: Payer: Federal, State, Local not specified - PPO | Admitting: Nurse Practitioner

## 2018-01-28 VITALS — BP 118/80 | HR 86 | Ht 72.0 in | Wt 202.8 lb

## 2018-01-28 DIAGNOSIS — I259 Chronic ischemic heart disease, unspecified: Secondary | ICD-10-CM

## 2018-01-28 DIAGNOSIS — I5022 Chronic systolic (congestive) heart failure: Secondary | ICD-10-CM

## 2018-01-28 DIAGNOSIS — I10 Essential (primary) hypertension: Secondary | ICD-10-CM

## 2018-01-28 DIAGNOSIS — I4891 Unspecified atrial fibrillation: Secondary | ICD-10-CM

## 2018-01-28 DIAGNOSIS — E7849 Other hyperlipidemia: Secondary | ICD-10-CM

## 2018-01-28 DIAGNOSIS — Z79899 Other long term (current) drug therapy: Secondary | ICD-10-CM

## 2018-01-28 MED ORDER — ATORVASTATIN CALCIUM 80 MG PO TABS: 80.0000 mg | ORAL_TABLET | Freq: Every day | ORAL | 3 refills | Status: DC

## 2018-01-28 MED ORDER — LISINOPRIL 5 MG PO TABS: 5.0000 mg | ORAL_TABLET | Freq: Every day | ORAL | 3 refills | Status: DC

## 2018-01-28 MED ORDER — COLCHICINE 0.6 MG PO TABS: 0.6000 mg | ORAL_TABLET | Freq: Every day | ORAL | 1 refills | Status: DC | PRN

## 2018-01-28 MED ORDER — APIXABAN 5 MG PO TABS: 5.0000 mg | ORAL_TABLET | Freq: Two times a day (BID) | ORAL | 3 refills | Status: DC

## 2018-01-28 MED ORDER — EZETIMIBE 10 MG PO TABS: 10.0000 mg | ORAL_TABLET | Freq: Every day | ORAL | 3 refills | Status: DC

## 2018-01-28 MED ORDER — METOPROLOL SUCCINATE ER 100 MG PO TB24: 100.0000 mg | ORAL_TABLET | Freq: Every day | ORAL | 3 refills | Status: DC

## 2018-01-28 NOTE — Progress Notes (Signed)
CARDIOLOGY OFFICE NOTE  Date:  01/28/2018    Richard [name] Date of Birth: 1955-09-16 Medical Record #[id number]  PCP:  Biagio Borg, MD  Cardiologist:  Marisa Cyphers    Chief Complaint  Patient presents with  . Atrial Fibrillation    Follow up visit - seen for Dr. Marlou Porch    History of Present Illness: Richard [name] is a 62 y.o. male who presents today for a 10 day check. Former patient of Dr. Doug Sou - not seen since 2012 - now followed by Dr. Marlou Porch.   He has a history of hypertension, hyperlipidemia, & sleep apnea. He has had a prior cardiac CT showing proximal LAD disease - not obstructive in 2012 (less than 40%).   Presented last month to the ER with dysarthria and right upper extremity weakness. He was also found to be in A. fib with RVR on presentation. MRI brain was performed which revealed small volume acute ischemic cortical infarct involving the left parietal temporal region, no hemorrhage transformation or mass effect noted. Neurology was consulted. Cardiology also consulted for new onset A. fib with RVR.His echocardiogram also showed reduced ejection fraction to 40 to 45%. Medications were adjusted. Weakness on his right upper extremity improved.   I then saw him for a post hospital visit - they had lots of questions about the plan going forward. He was doing ok clinically. Arranged sleep study. Will need cardiac CT - already with known CAD per prior study in 2012. To have 4 weeks of anticoagulation and then proceed with cardioversion. We did stop his aspirin. His HR was not controlled by EKG and did not correlate with his FitBit. Discussed with Dr. Rayann Heman - we added back his Diltiazem and stopped aspirin.   Comes in today. Here with his wife. Doing ok. Needs refills. He has no awareness of his AF. He has no chest pain. He is not short of breath. Not dizzy. No missed doses of Eliquis. No bleeding/bruising. Still getting some OT. Seeing neurology on the 21st.  Asking about driving - there is no history of syncope or seizure disorder. He is retired. His gout is basically gone - he is NOT using colchicine at all. He needs his medicines refilled. Sleep study already ordered - waiting to be scheduled.   Past Medical History:  Diagnosis Date  . Alcoholism in remission ([place]) 08/25/2011  . Allergy    seasonal  . CAD (coronary artery disease) 08/25/2011  . Depression   . Hyperlipidemia   . Hypertension   . Sleep apnea    did use cpap about 10 years ago no use now    Past Surgical History:  Procedure Laterality Date  . COLONOSCOPY  10-22-11   3 polyps  . FINGER NEUROPLASTY     left index finger  . left achillies  2003  . POLYPECTOMY       Medications: Current Meds  Medication Sig  . apixaban (ELIQUIS) 5 MG TABS tablet Take 1 tablet (5 mg total) by mouth 2 (two) times daily.  Marland Kitchen atorvastatin (LIPITOR) 80 MG tablet Take 1 tablet (80 mg total) by mouth daily.  . colchicine 0.6 MG tablet Take 1 tablet (0.6 mg total) by mouth daily as needed. 1 tab by mouth every hour until pain improved or diarrhea  . diltiazem (CARDIZEM CD) 240 MG 24 hr capsule Take 1 capsule (240 mg total) by mouth daily.  Marland Kitchen ezetimibe (ZETIA) 10 MG tablet Take 1 tablet (10 mg total) by  mouth daily.  Marland Kitchen lisinopril (PRINIVIL,ZESTRIL) 5 MG tablet Take 1 tablet (5 mg total) by mouth daily.  . metoprolol succinate (TOPROL-XL) 100 MG 24 hr tablet Take 1 tablet (100 mg total) by mouth daily. Take with or immediately following a meal.  . montelukast (SINGULAIR) 10 MG tablet Take 1 tablet (10 mg total) by mouth daily.  Marland Kitchen triamcinolone (NASACORT ALLERGY 24HR CHILDREN) 55 MCG/ACT AERO nasal inhaler Place 2 sprays into the nose as needed (seasonal allergies).  . [DISCONTINUED] apixaban (ELIQUIS) 5 MG TABS tablet Take 1 tablet (5 mg total) by mouth 2 (two) times daily.  . [DISCONTINUED] atorvastatin (LIPITOR) 80 MG tablet Take 1 tablet (80 mg total) by mouth daily.  . [DISCONTINUED] colchicine 0.6  MG tablet 1 tab by mouth every hour until pain improved or diarrhea  . [DISCONTINUED] ezetimibe (ZETIA) 10 MG tablet Take 1 tablet (10 mg total) by mouth daily.  . [DISCONTINUED] lisinopril (PRINIVIL,ZESTRIL) 5 MG tablet Take 1 tablet (5 mg total) by mouth daily.  . [DISCONTINUED] metoprolol succinate (TOPROL-XL) 100 MG 24 hr tablet Take 1 tablet (100 mg total) by mouth daily. Take with or immediately following a meal.  . [DISCONTINUED] triamcinolone (NASACORT AQ) 55 MCG/ACT AERO nasal inhaler Place 2 sprays into the nose daily. (Patient taking differently: Place 2 sprays into the nose daily as needed (congestion). )     Allergies: Allergies  Allergen Reactions  . Penicillins Other (See Comments)    Siblings are allergic Has patient had a PCN reaction causing immediate rash, facial/tongue/throat swelling, SOB or lightheadedness with hypotension: Unknown Has patient had a PCN reaction causing severe rash involving mucus membranes or skin necrosis: Unknown Has patient had a PCN reaction that required hospitalization: Unknown Has patient had a PCN reaction occurring within the last 10 years: Unknown If all of the above answers are "NO", then may proceed with Cephalosporin use.    Social History: The patient  reports that he has never smoked. He has never used smokeless tobacco. He reports that he does not drink alcohol or use drugs.   Family History: The patient's family history includes Diabetes in his mother; Heart attack in his brother; Hypertension in his mother.   Review of Systems: Please see the history of present illness.   Otherwise, the review of systems is positive for none.   All other systems are reviewed and negative.   Physical Exam: VS:  BP 118/80 (BP Location: Left Arm, Patient Position: Sitting, Cuff Size: Normal)   Pulse 86   Ht 6' (1.829 m)   Wt 202 lb 12.8 oz (92 kg)   BMI 27.50 kg/m  .  BMI Body mass index is 27.5 kg/m.  Wt Readings from Last 3 Encounters:    01/28/18 202 lb 12.8 oz (92 kg)  01/20/18 203 lb 6.4 oz (92.3 kg)  01/16/18 204 lb (92.5 kg)    General: Pleasant. Well developed, well nourished and in no acute distress.   HEENT: Normal.  Neck: Supple, no JVD, carotid bruits, or masses noted.  Cardiac: Irregular irregular rhythm. His rate is much better today. Heart tones are distant. No murmurs, rubs, or gallops. No edema.  Respiratory:  Lungs are clear to auscultation bilaterally with normal work of breathing.  GI: Soft and nontender.  MS: No deformity or atrophy. Gait and ROM intact.  Skin: Warm and dry. Color is normal.  Neuro:  Strength and sensation are intact and no gross focal deficits noted.  Psych: Alert, appropriate and with normal affect.  LABORATORY DATA:  EKG:  EKG is ordered today. This demonstrates AF with controlled VR of 86 today - much improved.  Lab Results  Component Value Date   WBC 7.4 01/20/2018   HGB 17.2 01/20/2018   HCT 48.6 01/20/2018   PLT 245 01/20/2018   GLUCOSE 86 01/20/2018   CHOL 250 (H) 01/06/2018   TRIG 86 01/06/2018   HDL 41 01/06/2018   LDLDIRECT 146.2 08/30/2011   LDLCALC 192 (H) 01/06/2018   ALT 31 01/05/2018   AST 31 01/05/2018   NA 138 01/20/2018   K 5.0 01/20/2018   CL 104 01/20/2018   CREATININE 1.21 01/20/2018   BUN 17 01/20/2018   CO2 20 01/20/2018   TSH 1.077 01/06/2018   PSA 1.90 11/01/2017   INR 0.98 01/05/2018   HGBA1C 5.5 01/06/2018     BNP (last 3 results) No results for input(s): BNP in the last 8760 hours.  ProBNP (last 3 results) No results for input(s): PROBNP in the last 8760 hours.   Other Studies Reviewed Today:  Echo 01/06/18: Study Conclusions - Left ventricle: The cavity size was normal. Systolic function was mildly to moderately reduced. The estimated ejection fraction was in the range of 40% to 45%. Moderate diffuse hypokinesis with no identifiable regional variations. - Aortic valve: There was mild regurgitation. - Left atrium:  The atrium was mildly dilated. - Right atrium: The atrium was mildly dilated. - Pulmonary arteries: PA peak pressure: 31 mm Hg (S).    Assessment/Plan:  1. AF with RVR - his rate is more controlled today. He remains on Eliquis - we will be able to proceed with cardioversion after August 23rd. Arranged for August 27th with Dr. Stanford Breed at 12 noon. The procedure/risk/benefits have been reviewed in full detail and he is willing to proceed. Will check lab next week. Reminded of no missed doses of Eliquis. He remains asymptomatic. Hopefully with restoration to NSR - his EF will improve as well. If he fails to convert, may consider EP referral/AAD therapy but he has no symptoms clinically or awareness of his AF.   2. HTN - BP looks good. No changes made.   3. Recent stroke - seeing neurology next week. I do not see a reason why he cannot resume driving from our standpoint.   4. New LV systolic HF - EF of 40 to 45% - unclear how much of this is tachy mediated. Would plan to recheck echo after HR is controlled/restoration of NSR. He is on low dose ACE with beta blocker. He is doing well and has no symptoms at this time.   5. Known CAD - has had remote CT - will plan on rechecking to make sure that this was not the etiology for the AF.   6. HLD - on statin  7. Gout - resolved - not using Colchicine.   Current medicines are reviewed with the patient today.  The patient does not have concerns regarding medicines other than what has been noted above.  The following changes have been made:  See above.  Labs/ tests ordered today include:    Orders Placed This Encounter  Procedures  . CT CORONARY MORPH W/CTA COR W/SCORE W/CA W/CM &/OR WO/CM  . CT CORONARY FRACTIONAL FLOW RESERVE DATA PREP  . CT CORONARY FRACTIONAL FLOW RESERVE FLUID ANALYSIS  . Comprehensive metabolic panel  . CBC  . EKG 12-Lead     Disposition:   FU with Dr. Marlou Porch in one month.    Patient is  agreeable to this  plan and will call if any problems develop in the interim.   SignedTruitt Merle, NP  01/28/2018 3:22 PM  [facility] [address] [place], [place]  [address] Phone: [phone] Fax: [phone]

## 2018-01-28 NOTE — Patient Instructions (Addendum)
We will be checking the following labs today - NONE  Lab next week - CMET and CBC   Medication Instructions:    Continue with your current medicines.   I have sent in your refills today   Testing/Procedures To Be Arranged:  Coronary CT scan  Cardioversion  Follow-Up:   See Dr. Marlou Porch in about a month with EKG    Other Special Instructions:    Your provider has recommended a cardioversion.   You are scheduled for a cardioversion on Tuesday, August 27th at 12NOON with Dr. Stanford Breed or associates. Please go to [facility] ([address] 2nd [place] Stay at 10:30AM.  Enter through the [place] not have any food or drink after midnight on Monday.  You may take your medicines with a sip of water on the day of your procedure.   Hold the following medicines NONE  DO NOT STOP YOUR ANTICOAGULANT (BLOOD THINNER) Eliquis - YOU WILL NEED TO CONTINUE YOUR ANTICOAGULANT AFTER YOUR PROCEDURE   You will need someone to drive you home following your procedure and stay in the waiting room during your procedure. Failure to do so could result in your procedure being cancelled.   Every effort is made to have your procedure done on time. Occasionally there are emergencies that occur at the hospital that may cause delays.   Call the [place] office at [phone] if you have any questions, problems or concerns.     Electrical Cardioversion Electrical cardioversion is the delivery of a jolt of electricity to change the rhythm of the heart. Sticky patches or metal paddles are placed on the chest to deliver the electricity from a device. This is done to restore a normal rhythm. A rhythm that is too fast or not regular keeps the heart from pumping well. Electrical cardioversion is done in an emergency if:   There is low or no blood pressure as a result of the heart rhythm.   Normal rhythm must be restored as fast as possible to  protect the brain and heart from further damage.   It may save a life. Cardioversion may be done for heart rhythms that are not immediately life threatening, such as atrial fibrillation or flutter, in which:   The heart is beating too fast or is not regular.   Medicine to change the rhythm has not worked.   It is safe to wait in order to allow time for preparation.  Symptoms of the abnormal rhythm are bothersome.  The risk of stroke and other serious problems can be reduced.  LET [facility] CARE PROVIDER KNOW ABOUT:   Any allergies you have.  All medicines you are taking, including vitamins, herbs, eye drops, creams, and over-the-counter medicines.  Previous problems you or members of your family have had with the use of anesthetics.   Any blood disorders you have.   Previous surgeries you have had.   Medical conditions you have.   RISKS AND COMPLICATIONS  Generally, this is a safe procedure. However, problems can occur and include:   Breathing problems related to the anesthetic used.  A blood clot that breaks free and travels to other parts of your body. This could cause a stroke or other problems. The risk of this is lowered by use of blood-thinning medicine (anticoagulant) prior to the procedure.  Cardiac arrest (rare).   BEFORE THE PROCEDURE   You may have tests to detect blood clots in your  heart and to evaluate heart function.  You may start taking anticoagulants so your blood does not clot as easily.   Medicines may be given to help stabilize your heart rate and rhythm.   PROCEDURE  You will be given medicine through an IV tube to reduce discomfort and make you sleepy (sedative).   An electrical shock will be delivered.   AFTER THE PROCEDURE Your heart rhythm will be watched to make sure it does not change. You will need someone to drive you home.     INSTRUCTION FOR CORONARY CT  Please arrive at the [facility] main entrance of [facility] at _______________________ ___________________________AM (30-45 minutes prior to test start time)  [facility] [address] [place], [place] [address] [phone]  Proceed to the [facility] Radiology Department (First Floor).  Please follow these instructions carefully (unless otherwise directed):  Hold all erectile dysfunction medications at least 48 hours prior to test.  On the Night Before the Test: . Drink plenty of water. . Do not consume any caffeinated/decaffeinated beverages or chocolate 12 hours prior to your test. . Do not take any antihistamines 12 hours prior to your test.   On the Day of the Test: . Drink plenty of water. Do not drink any water within one hour of the test. . Do not eat any food 4 hours prior to the test. . You may take your regular medications prior to the test.   After the Test: . Drink plenty of water. . After receiving IV contrast, you may experience a mild flushed feeling. This is normal. . On occasion, you may experience a mild rash up to 24 hours after the test. This is not dangerous. If this occurs, you can take Benadryl 25 mg and increase your fluid intake. . If you experience trouble breathing, this can be serious. If it is severe call 911 IMMEDIATELY. If it is mild, please call our office. . If you take any of these medications: Glipizide/Metformin, Avandament, Glucavance, please do not take 48 hours after completing test.     If you need a refill on your cardiac medications before your next appointment, please call your pharmacy.   Call the [place] office at [phone] if you have any questions, problems or concerns.

## 2018-02-05 ENCOUNTER — Ambulatory Visit: Payer: Federal, State, Local not specified - PPO | Admitting: Adult Health

## 2018-02-05 ENCOUNTER — Encounter: Payer: Self-pay | Admitting: Adult Health

## 2018-02-05 ENCOUNTER — Other Ambulatory Visit: Payer: Federal, State, Local not specified - PPO | Admitting: *Deleted

## 2018-02-05 VITALS — BP 114/69 | HR 55 | Ht 72.0 in | Wt 205.6 lb

## 2018-02-05 DIAGNOSIS — I10 Essential (primary) hypertension: Secondary | ICD-10-CM | POA: Diagnosis not present

## 2018-02-05 DIAGNOSIS — I480 Paroxysmal atrial fibrillation: Secondary | ICD-10-CM | POA: Diagnosis not present

## 2018-02-05 DIAGNOSIS — I4891 Unspecified atrial fibrillation: Secondary | ICD-10-CM | POA: Diagnosis not present

## 2018-02-05 DIAGNOSIS — I259 Chronic ischemic heart disease, unspecified: Secondary | ICD-10-CM | POA: Diagnosis not present

## 2018-02-05 DIAGNOSIS — E7800 Pure hypercholesterolemia, unspecified: Secondary | ICD-10-CM

## 2018-02-05 DIAGNOSIS — I639 Cerebral infarction, unspecified: Secondary | ICD-10-CM

## 2018-02-05 DIAGNOSIS — I5022 Chronic systolic (congestive) heart failure: Secondary | ICD-10-CM

## 2018-02-05 NOTE — Patient Instructions (Addendum)
Continue Eliquis (apixaban) daily  and lipitor and zetia  for secondary stroke prevention  Continue taking medications as prescribed  Continue to follow up with PCP regarding cholesterol and blood pressure management - recheck cholesterol levels in 1 month to ensure levels are coming down. If no change, recommend starting Repatha for better control  Continue therapies and continue exercise as well as healthy diet   Continue to monitor blood pressure at home  Maintain strict control of hypertension with blood pressure goal below 130/90, diabetes with hemoglobin A1c goal below 6.5% and cholesterol with LDL cholesterol (bad cholesterol) goal below 70 mg/dL. I also advised the patient to eat a healthy diet with plenty of whole grains, cereals, fruits and vegetables, exercise regularly and maintain ideal body weight.  Followup in the future with me in 3 months or call earlier if needed       Thank you for coming to see [place] at [facility]. I hope we have been able to provide you high quality care today.  You may receive a patient satisfaction survey over the next few weeks. We would appreciate your feedback and comments so that we may continue to improve ourselves and the health of our patients.

## 2018-02-05 NOTE — Telephone Encounter (Signed)
  Suits, Magdalene Patricia, RN  Freada Bergeron, CMA        Please see below authorization,   Schedule sleep study and attach referral. Thank you     ----- Message -----  From: Silverio Lay, RN  Sent: 02/05/2018  9:18 AM EDT  To: Lauralee Evener, CMA, [facility] Sleep [facility]  Subject: Authorization, please schedule          Received notification from [facility], precert authorized. Reference # [id number] date of service 01/22/18-02/21/18.   Please call patient to schedule study Mariann Laster OOO this week)   Thanks!  ----- Message -----  From: Tamsen Snider  Sent: 01/20/2018 11:12 AM EDT  To: [facility] Sleep Studies   Pt was seen by Truitt Merle, NP today and needs to be set up for a sleep study please.

## 2018-02-05 NOTE — Progress Notes (Signed)
[facility] [address] [place]. [place] [address] [phone]       OFFICE FOLLOW UP NOTE  [name] Date of Birth:  [birth year] Medical Record Number:  [id number]   Reason for Referral:  hospital stroke follow up  CHIEF COMPLAINT:  Chief Complaint  Patient presents with  . Follow-up    Stroke follow up room in back with Richard [name] your wife was seen at hospital , had sleep test 15 years ago    HPI: Richard [name] is being seen today for initial visit in the office for left parietal temporal cortical infarcts due to new onset AF on 01/05/2018. History obtained from patient, wife and chart review. Reviewed all radiology images and labs personally.  [name] is a 62 y.o. male with history of ETOH abuse in remission x 12 yrs, HTN, HLD, OSA who presented with right-sided weakness, right facial droop and global aphasia.  During transport to ED, EMS found atrial fibrillation on EKG and patient denies prior history or diagnosis of AF.  AF further confirmed on EKG performed at [facility] ED.  Patient was not on antithrombotic PTA.  CT had reviewed and was negative for acute stroke.  CTA head and neck was negative for significant stenosis.  CT perfusion was negative.  MRI head reviewed and showed patchy small left parietal temporal cortical infarcts with associated petechial hemorrhage.  EEG negative for seizure activity.  2D echo showed an EF of 40 to 45%.  LDL 192 and recommended continuation of Lipitor 80 mg along with addition of Zetia.  HTN stable during admission and recommended long-term BP goal.  A1c satisfactory at 5.5.  Patient does have history of OSA not on CPAP.  Patient was discharged home without therapy needs. Patient was referred to cardiology for follow-up on 01/20/2018.  Referral for sleep study was placed.  Patient was discharged on aspirin 325 along with full anticoagulation with Eliquis and is recommended to stop aspirin as there is no indication for  continuation.  Patient is being seen today for hospital stroke follow-up and is accompanied by his wife.  He continues to do well since hospital discharge with only mild residual deficit of right hand weakness but otherwise states all of the weakness resolved along with dysarthria.  He continues to participate in OT at our neuro rehab clinic.  He has completed PT and speech therapy.  He continues to take Eliquis without side effects of bleeding or bruising and continues to follow-up with cardiologist along with undergoing scheduled cardioversion on 02/11/2018.  Continues to take Lipitor along with Zetia without complaints of myalgias.  Recommended PCP obtain lipid panel in 1 month to ensure decrease in LDL and if LDL not decreased, recommend consideration of starting Repatha.  Patient states that he was not consistently taking Lipitor 80 mg prior to his stroke but has been compliant with all prescribed medications since his stroke.  He also has decrease his sodium intake along with avoiding red meats and increasing intake of fish and occasionally chicken.  Blood pressure today satisfactory 114/69.  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  Denies new or worsening stroke/TIA symptoms.   ROS:   14 system review of systems performed and negative with exception of weight loss  PMH:  Past Medical History:  Diagnosis Date  . Alcoholism in remission ([place]) 08/25/2011  . Allergy    seasonal  . CAD (coronary artery disease) 08/25/2011  . Depression   .  Gout   . Hyperlipidemia   . Hypertension   . Sleep apnea    did use cpap about 10 years ago no use now  . Stroke [facility])     PSH:  Past Surgical History:  Procedure Laterality Date  . COLONOSCOPY  10-22-11   3 polyps  . FINGER NEUROPLASTY     left index finger  . left achillies  2003  . POLYPECTOMY      Social History:  Social History   Socioeconomic History  . Marital status: Married    Spouse name:  Not on file  . Number of children: 2  . Years of education: Not on file  . Highest education level: Not on file  Occupational History  . Occupation: Teacher, adult education    Comment: [employer]  . Financial resource strain: Not on file  . Food insecurity:    Worry: Not on file    Inability: Not on file  . Transportation needs:    Medical: Not on file    Non-medical: Not on file  Tobacco Use  . Smoking status: Never Smoker  . Smokeless tobacco: Never Used  Substance and Sexual Activity  . Alcohol use: No  . Drug use: No  . Sexual activity: Not on file  Lifestyle  . Physical activity:    Days per week: Not on file    Minutes per session: Not on file  . Stress: Not on file  Relationships  . Social connections:    Talks on phone: Not on file    Gets together: Not on file    Attends religious service: Not on file    Active member of club or organization: Not on file    Attends meetings of clubs or organizations: Not on file    Relationship status: Not on file  . Intimate partner violence:    Fear of current or ex partner: Not on file    Emotionally abused: Not on file    Physically abused: Not on file    Forced sexual activity: Not on file  Other Topics Concern  . Not on file  Social History Narrative  . Not on file    Family History:  Family History  Problem Relation Age of Onset  . Hypertension Mother   . Diabetes Mother   . Heart attack Brother   . Colon cancer Neg Hx   . Stomach cancer Neg Hx   . Rectal cancer Neg Hx   . Esophageal cancer Neg Hx     Medications:   Current Outpatient Medications on File Prior to Visit  Medication Sig Dispense Refill  . apixaban (ELIQUIS) 5 MG TABS tablet Take 1 tablet (5 mg total) by mouth 2 (two) times daily. 180 tablet 3  . atorvastatin (LIPITOR) 80 MG tablet Take 1 tablet (80 mg total) by mouth daily. 90 tablet 3  . colchicine 0.6 MG tablet Take 1 tablet (0.6 mg total) by mouth daily as needed. 1 tab by mouth every  hour until pain improved or diarrhea 40 tablet 1  . diltiazem (CARDIZEM CD) 240 MG 24 hr capsule Take 1 capsule (240 mg total) by mouth daily. 90 capsule 3  . ezetimibe (ZETIA) 10 MG tablet Take 1 tablet (10 mg total) by mouth daily. 90 tablet 3  . lisinopril (PRINIVIL,ZESTRIL) 5 MG tablet Take 1 tablet (5 mg total) by mouth daily. 90 tablet 3  . metoprolol succinate (TOPROL-XL) 100 MG 24 hr tablet Take 1 tablet (100 mg  total) by mouth daily. Take with or immediately following a meal. 90 tablet 3  . montelukast (SINGULAIR) 10 MG tablet Take 1 tablet (10 mg total) by mouth daily. 90 tablet 3   No current facility-administered medications on file prior to visit.     Allergies:   Allergies  Allergen Reactions  . Penicillins Other (See Comments)    Siblings are allergic Has patient had a PCN reaction causing immediate rash, facial/tongue/throat swelling, SOB or lightheadedness with hypotension: Unknown Has patient had a PCN reaction causing severe rash involving mucus membranes or skin necrosis: Unknown Has patient had a PCN reaction that required hospitalization: Unknown Has patient had a PCN reaction occurring within the last 10 years: Unknown If all of the above answers are "NO", then may proceed with Cephalosporin use.     Physical Exam  Vitals:   02/05/18 1523  BP: 114/69  Pulse: (!) 55  Weight: 205 lb 9.6 oz (93.3 kg)  Height: 6' (1.829 m)   Body mass index is 27.88 kg/m. No exam data present  General: well developed, well nourished, pleasant middle-aged Caucasian male, seated, in no evident distress Head: head normocephalic and atraumatic.   Neck: supple with no carotid or supraclavicular bruits Cardiovascular: regular rate and rhythm, no murmurs Musculoskeletal: no deformity Skin:  no rash/petichiae Vascular:  Normal pulses all extremities  Neurologic Exam Mental Status: Awake and fully alert. Oriented to place and time. Recent and remote memory intact. Attention  span, concentration and fund of knowledge appropriate. Mood and affect appropriate.  Cranial Nerves: Fundoscopic exam reveals sharp disc margins. Pupils equal, briskly reactive to light. Extraocular movements full without nystagmus. Visual fields full to confrontation. Hearing intact. Facial sensation intact. Face, tongue, palate moves normally and symmetrically.  Motor: Normal bulk and tone. Normal strength in all tested extremity muscles. Sensory.: intact to touch , pinprick , position and vibratory sensation.  Coordination: Rapid alternating movements normal in all extremities. Finger-to-nose and heel-to-shin performed accurately bilaterally.  Decreased right finger dexterity. Gait and Station: Arises from chair without difficulty. Stance is normal. Gait demonstrates normal stride length and balance . Able to heel, toe and tandem walk without difficulty.  Reflexes: 1+ and symmetric. Toes downgoing.    NIHSS  0 Modified Rankin  1 HAS-BLED 2 CHA2DS2-VASc 4   Diagnostic Data (Labs, Imaging, Testing)  CT HEAD WO CONTRAST 01/05/2018 IMPRESSION: 1. Negative exam 2. ASPECTS is 10.  MR BRAIN WO CONTRAST 01/05/2018 IMPRESSION: 1. Patchy small volume acute ischemic cortical infarcts involving the left parietotemporal region as above. Scant associated petechial hemorrhage without hemorrhagic transformation or mass effect. 2. Otherwise normal brain MRI.  CT ANGIO HEAD W OR WO CONTRAST CT ANGIO NECK W OR WO CONTRAST 01/05/2018 IMPRESSION: No intracranial or extracranial stenosis or occlusion. Mild irregularity of the distal MCA branches suggesting intracranial atherosclerotic change.   ECHOCARDIOGRAM 01/06/2018 Study Conclusions  - Left ventricle: The cavity size was normal. Systolic function was   mildly to moderately reduced. The estimated ejection fraction was   in the range of 40% to 45%. Moderate diffuse hypokinesis with no   identifiable regional variations. - Aortic valve:  There was mild regurgitation. - Left atrium: The atrium was mildly dilated. - Right atrium: The atrium was mildly dilated. - Pulmonary arteries: PA peak pressure: 31 mm Hg (S   ASSESSMENT: Rider Ermis is a 62 y.o. year old male here with left parietal temporal region infarct on 01/05/2018 secondary to new onset AF not on AC. Vascular risk factors  include new onset AF, HLD, HTN and OSA.  Patient is being seen today for hospital stroke follow-up and overall is doing well with mild residual left hand weakness.    PLAN: -Continue Eliquis (apixaban) daily  and Lipitor and Zetia for secondary stroke prevention -Recommend lipid panel recheck in 1 month and if LDL is not decreased, recommend starting Repatha for better cholesterol management and stroke prevention -F/u with PCP regarding your LDL and HTN management -f/u with cardiologist for continued management of AF and AC along with cardioversion on 02/11/2018 -Continue outpatient OT along with home exercises -continue to monitor BP at home -advised to continue to stay active and maintain a healthy diet -Maintain strict control of hypertension with blood pressure goal below 130/90, diabetes with hemoglobin A1c goal below 6.5% and cholesterol with LDL cholesterol (bad cholesterol) goal below 70 mg/dL. I also advised the patient to eat a healthy diet with plenty of whole grains, cereals, fruits and vegetables, exercise regularly and maintain ideal body weight.  Follow up in 3 months or call earlier if needed   Greater than 50% of time during this 25 minute visit was spent on counseling,explanation of diagnosis of left parietal temporal region infarct, reviewing risk factor management of AF, HLD, HTN and OSA, planning of further management, discussion with patient and family and coordination of care    Venancio Poisson, AGNP-BC  [facility] [address] [place], [place] [address]  Phone [phone] Fax  [phone] Note: This document was prepared with digital dictation and possible smart phrase technology. Any transcriptional errors that result from this process are unintentional.

## 2018-02-06 ENCOUNTER — Ambulatory Visit: Payer: Federal, State, Local not specified - PPO | Admitting: Occupational Therapy

## 2018-02-06 ENCOUNTER — Encounter: Payer: Self-pay | Admitting: Occupational Therapy

## 2018-02-06 DIAGNOSIS — M6281 Muscle weakness (generalized): Secondary | ICD-10-CM | POA: Diagnosis not present

## 2018-02-06 DIAGNOSIS — R41841 Cognitive communication deficit: Secondary | ICD-10-CM | POA: Diagnosis not present

## 2018-02-06 DIAGNOSIS — R278 Other lack of coordination: Secondary | ICD-10-CM | POA: Diagnosis not present

## 2018-02-06 DIAGNOSIS — R4701 Aphasia: Secondary | ICD-10-CM | POA: Diagnosis not present

## 2018-02-06 DIAGNOSIS — R471 Dysarthria and anarthria: Secondary | ICD-10-CM | POA: Diagnosis not present

## 2018-02-06 LAB — COMPREHENSIVE METABOLIC PANEL
ALT: 36 IU/L (ref 0–44)
AST: 24 IU/L (ref 0–40)
Albumin/Globulin Ratio: 2 (ref 1.2–2.2)
Albumin: 4.3 g/dL (ref 3.6–4.8)
Alkaline Phosphatase: 89 IU/L (ref 39–117)
BUN/Creatinine Ratio: 14 (ref 10–24)
BUN: 15 mg/dL (ref 8–27)
Bilirubin Total: 0.6 mg/dL (ref 0.0–1.2)
CO2: 21 mmol/L (ref 20–29)
Calcium: 9.1 mg/dL (ref 8.6–10.2)
Chloride: 105 mmol/L (ref 96–106)
Creatinine, Ser: 1.11 mg/dL (ref 0.76–1.27)
GFR calc Af Amer: 81 mL/min/{1.73_m2} (ref >59)
GFR calc non Af Amer: 70 mL/min/{1.73_m2} (ref >59)
Globulin, Total: 2.2 g/dL (ref 1.5–4.5)
Glucose: 91 mg/dL (ref 65–99)
Potassium: 4 mmol/L (ref 3.5–5.2)
Sodium: 141 mmol/L (ref 134–144)
Total Protein: 6.5 g/dL (ref 6.0–8.5)

## 2018-02-06 LAB — CBC
Hematocrit: 42.8 % (ref 37.5–51.0)
Hemoglobin: 14.7 g/dL (ref 13.0–17.7)
MCH: 31 pg (ref 26.6–33.0)
MCHC: 34.3 g/dL (ref 31.5–35.7)
MCV: 90 fL (ref 79–97)
Platelets: 169 10*3/uL (ref 150–450)
RBC: 4.74 x10E6/uL (ref 4.14–5.80)
RDW: 13.7 % (ref 12.3–15.4)
WBC: 6.9 10*3/uL (ref 3.4–10.8)

## 2018-02-06 NOTE — Patient Instructions (Signed)
   Flip card between each finger and work on fluid movement   Dribbling basketball  Tossing and catching ball with right hand, elbow by your side and focus on doing movement with wrist/fingers.    Flexor Tendon Gliding (Active Hook Fist)   With fingers and knuckles straight, bend middle and tip joints. Do not bend large knuckles.    MP Flexion (Active Isolated)   Bend ALL fingers at large knuckle, keeping other fingers straight. Do not bend tips.   Flexor Tendon Gliding (Active Straight Fist)   Keep fingertip joints straight to touch base of palm.  Flexor Tendon Gliding (Active Full Fist)   Straighten all fingers, then make a fist, bending all joints.  Perform sequence 5-10 times.

## 2018-02-06 NOTE — Therapy (Signed)
[facility] [address] [place], [place], [address] Phone: [phone]   Fax:  [phone]  Occupational Therapy Treatment  Patient Details  Name: Richard [name] MRN: [id number] Date of Birth: 01-01-1956 Referring Provider: Dr Shelly Coss   Encounter Date: 02/06/2018  OT End of Session - 02/06/18 0755    Visit Number  3    Number of Visits  7    Date for OT Re-Evaluation  03/07/18    Authorization Type  Federal BCBS 75 visit limit PT, OT, ST with 0 used    Authorization - Visit Number  3    Authorization - Number of Visits  10    OT Start Time  0752    OT Stop Time  0835    OT Time Calculation (min)  43 min    Activity Tolerance  Patient tolerated treatment well    Behavior During Therapy  [facility] for tasks assessed/performed       Past Medical History:  Diagnosis Date  . Alcoholism in remission ([place]) 08/25/2011  . Allergy    seasonal  . CAD (coronary artery disease) 08/25/2011  . Depression   . Gout   . Hyperlipidemia   . Hypertension   . Sleep apnea    did use cpap about 10 years ago no use now  . Stroke [facility])     Past Surgical History:  Procedure Laterality Date  . COLONOSCOPY  10-22-11   3 polyps  . FINGER NEUROPLASTY     left index finger  . left achillies  2003  . POLYPECTOMY      There were no vitals filed for this visit.  Subjective Assessment - 02/06/18 0753    Subjective   Writing is not what it was, maybe I'm inpatient    Patient is accompained by:  Family member   wife   Pertinent History  CVA 01/05/18.  PMH:  hyperlipidemia, HTN, CAD, alcoholism in remission, depression, low back pain, a-fib, hyperglycemia    Patient Stated Goals  return to prior activities including driving     Currently in Pain?  No/denies         [facility] OT Assessment - 02/06/18 0001      Coordination   Right 9 Hole Peg Test  31.94        Checked progress towards goals and discussed progress.    Practiced  writing and discussed/educated pt on area to focus on.  Pt verbalized understanding/returned demo.       OT Education - 02/06/18 2395    Education Details  Updates to HEP (coordination and tendon glides)--see pt instructions.    Person(s) Educated  Patient    Methods  Explanation;Demonstration;Handout;Verbal cues    Comprehension  Verbalized understanding;Returned demonstration          OT Long Term Goals - 02/06/18 0803      OT LONG TERM GOAL #1   Title  Pt will be independent with HEP.--check goals 03/06/18    Time  6    Period  Weeks    Status  On-going   02/06/18  met with current, may need updates     OT LONG TERM GOAL #2   Title  Pt will improve coordination for ADLs/IADLs and leisure tasks as shown by improving time on 9-hole peg test by at least 20sec.  Updated 02/06/18:  complete in 26sec or less.    Baseline  57.62sec     Time  6  Period  Weeks    Status  On-going   31.94sec     OT LONG TERM GOAL #3   Title  Pt will be able to write 3 sentences with 100% legibility.    Baseline  75%    Time  6    Period  Weeks    Status  On-going   02/06/18:  85%     OT LONG TERM GOAL #4   Title  Pt will report ability to perform mod complex cooking tasks mod I.    Time  6    Period  Weeks    Status  Achieved   02/06/18           Plan - 02/06/18 0756    Clinical Impression Statement  Pt is progressing towards goals with improving coordination.  Pt has met initial 9-hole peg test goal (updated), met LTG#4 and is independent with initial HEP (updated/educated in additional ex today.    Occupational Profile and client history currently impacting functional performance  Pt was independent prior to CVA and enjoyed gardening, Fish farm manager, cooking, fishing, and playing golf.  Pt has not yet returned to all these activities, but is able to perform BADLs mod I.      Occupational performance deficits (Please refer to evaluation for details):   ADL's;IADL's;Leisure;Social Participation    Rehab Potential  Good    OT Frequency  1x / week    OT Duration  6 weeks   +eval   OT Treatment/Interventions  Self-care/ADL training;Cryotherapy;Therapeutic exercise;DME and/or AE instruction;Functional Mobility Training;Manual Therapy;Neuromuscular education;Fluidtherapy;Moist Heat;Energy conservation;Therapeutic activities;Patient/family education    Plan  update HEP prn, continue with writing and coordination    Clinical Decision Making  Limited treatment options, no task modification necessary    Consulted and Agree with Plan of Care  Patient;Family member/caregiver    Family Member Consulted  wife       Patient will benefit from skilled therapeutic intervention in order to improve the following deficits and impairments:  Decreased activity tolerance, Decreased coordination, Impaired UE functional use, Decreased endurance  Visit Diagnosis: Other lack of coordination  Muscle weakness (generalized)    Problem List Patient Active Problem List   Diagnosis Date Noted  . Acute gouty arthritis 01/16/2018  . Hyperglycemia 01/16/2018  . Acute ischemic stroke ([place])   . Stroke ([place]) 01/06/2018  . Atrial fibrillation ([place]) 01/06/2018  . Cough 10/18/2016  . Wheezing 10/18/2016  . Allergic rhinitis 08/31/2014  . Recurrent low back pain 10/15/2012  . Depression 08/30/2011  . Preventative health care 08/30/2011  . CAD (coronary artery disease) 08/25/2011  . Alcoholism in remission ([place]) 08/25/2011  . Hyperlipidemia   . Hypertension   . CHEST PAIN 08/30/2010    [facility] 02/06/2018, 8:52 AM  [facility] [address] [place], [place], [address] Phone: [phone]   Fax:  [phone]  Name: Richard [name] MRN: [id number] Date of Birth: 1956/05/25   Vianne Bulls, OTR/L [facility] [address] [place], [place]  [address] [phone]  phone [phone] 02/06/18 8:52 AM

## 2018-02-10 NOTE — Telephone Encounter (Signed)
Date of sleep study authorization extended until 03/23/2018.  Appt is scheduled for 03/08/2018.

## 2018-02-11 ENCOUNTER — Encounter: Payer: Federal, State, Local not specified - PPO | Admitting: Occupational Therapy

## 2018-02-11 ENCOUNTER — Encounter (HOSPITAL_COMMUNITY)
Admission: RE | Disposition: A | Discharge status: Home / Self Care | Payer: Self-pay | Source: Ambulatory Visit | Attending: Cardiology

## 2018-02-11 ENCOUNTER — Ambulatory Visit (HOSPITAL_COMMUNITY)
Admission: RE | Admit: 2018-02-11 | Discharge: 2018-02-11 | Disposition: A | Discharge status: Home / Self Care | Payer: Federal, State, Local not specified - PPO | Source: Ambulatory Visit | Attending: Cardiology | Admitting: Cardiology

## 2018-02-11 ENCOUNTER — Encounter (HOSPITAL_COMMUNITY): Payer: Self-pay | Admitting: Anesthesiology

## 2018-02-11 ENCOUNTER — Encounter (HOSPITAL_COMMUNITY): Payer: Self-pay | Admitting: Certified Registered Nurse Anesthetist

## 2018-02-11 DIAGNOSIS — R001 Bradycardia, unspecified: Secondary | ICD-10-CM | POA: Diagnosis not present

## 2018-02-11 SURGERY — CANCELLED PROCEDURE

## 2018-02-11 NOTE — Progress Notes (Signed)
Pt presented for DCCV; however was in sinus on arrival. Procedure canceled. Continue present meds and fu as scheduled. Kirk Ruths

## 2018-02-11 NOTE — Anesthesia Preprocedure Evaluation (Deleted)
Anesthesia Evaluation    Reviewed: reviewed documented beta blocker date and time   Airway        Dental   Pulmonary sleep apnea ,           Cardiovascular hypertension, Pt. on home beta blockers and Pt. on medications + CAD  + dysrhythmias (on Eliquis) Atrial Fibrillation   TTE 01/06/18 Left ventricle: The cavity size was normal. Systolic function was mildly to moderately reduced. The estimated ejection fraction was in the range of 40% to 45%. Moderate diffuse hypokinesis with no identifiable regional variations. - Aortic valve: There was mild regurgitation. - Left atrium: The atrium was mildly dilated. - Right atrium: The atrium was mildly dilated. - Pulmonary arteries: PA peak pressure: 31 mm Hg (S   Neuro/Psych Depression CVA (residual right hand weakness), Residual Symptoms    GI/Hepatic   Endo/Other    Renal/GU      Musculoskeletal  (+) Arthritis ,   Abdominal   Peds  Hematology   Anesthesia Other Findings   Reproductive/Obstetrics                             Anesthesia Physical Anesthesia Plan Anesthesia Quick Evaluation

## 2018-02-11 NOTE — Progress Notes (Signed)
Patient admitted to Endo for cardioversion. Patient placed on monitor and appeared to be in sinus bradycardia. EKG and MD confirmed SB. Patient's case cancelled prior to entering the procedure room. Patient spoke with MD and discharged to home.

## 2018-02-11 NOTE — H&P (Signed)
See progress notes; pt for DCCV but in sinus on arrival; procedure canceled. Kirk Ruths

## 2018-02-14 NOTE — Progress Notes (Signed)
I agree with the above plan 

## 2018-02-25 ENCOUNTER — Ambulatory Visit (HOSPITAL_COMMUNITY): Payer: Federal, State, Local not specified - PPO

## 2018-02-25 ENCOUNTER — Ambulatory Visit (HOSPITAL_COMMUNITY)
Admission: RE | Admit: 2018-02-25 | Discharge: 2018-02-25 | Disposition: A | Discharge status: Home / Self Care | Payer: Federal, State, Local not specified - PPO | Source: Ambulatory Visit | Attending: Nurse Practitioner | Admitting: Nurse Practitioner

## 2018-02-25 DIAGNOSIS — I110 Hypertensive heart disease with heart failure: Secondary | ICD-10-CM | POA: Diagnosis not present

## 2018-02-25 DIAGNOSIS — I259 Chronic ischemic heart disease, unspecified: Secondary | ICD-10-CM

## 2018-02-25 DIAGNOSIS — I10 Essential (primary) hypertension: Secondary | ICD-10-CM | POA: Insufficient documentation

## 2018-02-25 DIAGNOSIS — I4891 Unspecified atrial fibrillation: Secondary | ICD-10-CM | POA: Diagnosis not present

## 2018-02-25 DIAGNOSIS — I5022 Chronic systolic (congestive) heart failure: Secondary | ICD-10-CM | POA: Diagnosis not present

## 2018-02-25 MED ORDER — METOPROLOL TARTRATE 5 MG/5ML IV SOLN
5.0000 mg | Freq: Once | INTRAVENOUS | Status: AC
  Administered 2018-02-25: 5 mg via INTRAVENOUS

## 2018-02-25 MED ORDER — METOPROLOL TARTRATE 5 MG/5ML IV SOLN
5.0000 mg | INTRAVENOUS | Status: AC | PRN
  Administered 2018-02-25 (×4): 5 mg via INTRAVENOUS

## 2018-02-25 MED ORDER — METOPROLOL TARTRATE 5 MG/5ML IV SOLN
INTRAVENOUS | Status: AC
  Filled 2018-02-25: qty 20

## 2018-02-25 MED ORDER — METOPROLOL TARTRATE 5 MG/5ML IV SOLN
INTRAVENOUS | Status: AC
  Filled 2018-02-25: qty 5

## 2018-02-25 MED ORDER — NITROGLYCERIN 0.4 MG SL SUBL: 0.8000 mg | SUBLINGUAL_TABLET | Freq: Once | SUBLINGUAL | Status: DC

## 2018-02-25 MED ORDER — NITROGLYCERIN 0.4 MG SL SUBL
SUBLINGUAL_TABLET | SUBLINGUAL | Status: AC
  Filled 2018-02-25: qty 2

## 2018-02-25 NOTE — Progress Notes (Addendum)
Despite 25mg  lopressor given in total, patient HR between 65-75 and remains in afib. [facility] notified and test canceled due to heart rate.

## 2018-02-25 NOTE — Progress Notes (Signed)
Patient arrived for CT heart. Upon monitor placement, patient noted to be in afib with HR between 72-95. Aundra Dubin, MD, notified and orders given.

## 2018-02-28 ENCOUNTER — Ambulatory Visit: Payer: Federal, State, Local not specified - PPO | Admitting: Cardiology

## 2018-02-28 ENCOUNTER — Encounter: Payer: Self-pay | Admitting: Cardiology

## 2018-02-28 VITALS — BP 110/80 | HR 81 | Ht 72.0 in | Wt 202.8 lb

## 2018-02-28 DIAGNOSIS — I10 Essential (primary) hypertension: Secondary | ICD-10-CM | POA: Diagnosis not present

## 2018-02-28 DIAGNOSIS — I4891 Unspecified atrial fibrillation: Secondary | ICD-10-CM

## 2018-02-28 DIAGNOSIS — E7849 Other hyperlipidemia: Secondary | ICD-10-CM | POA: Diagnosis not present

## 2018-02-28 NOTE — Progress Notes (Signed)
ekg ekg

## 2018-02-28 NOTE — Progress Notes (Signed)
Cardiology Office Note:    Date:  02/28/2018   ID:  Richard [name], DOB [birth year], MRN [id number]  PCP:  Biagio Borg, MD  Cardiologist:  Candee Furbish, MD  Electrophysiologist:  None   Referring MD: Biagio Borg, MD     History of Present Illness:    Richard [name] is a 62 y.o. male seen by Truitt Merle, former patient of Dr. Martinique here for follow-up.  History of hypertension hyperlipidemia sleep apnea with proximal LAD disease on CT scan nonobstructive less than 40% in 2012.  Back in July 2019 went to the ER with right upper extremity weakness and dysarthria and was found to be in atrial fibrillation.  MRI showed a small stroke left parietal region, no hemorrhage.  Cardiology consulted for new weights A. fib.  EF on echocardiogram was 40 to 45%.  Weakness improved.  At a prior visit with Cecille Rubin, there were many questions about what to do going forward.  His heart rate did not correlate with Fitbit.  Dr. Rayann Heman was part of discussion.  Diltiazem was added back and aspirin was stopped.  Previously had no awareness of these atrial fibrillation, no chest pain no shortness of breath, no bleeding.  Gout is gone.  Not using colchicine.  02/28/2018- we had lengthy discussion about his progress and stroke.  He is been doing quite well except for subtle proprioception on the fingertips.  He is not symptomatic with his atrial fibrillation.  He has been in and out.  In fact, he went for a cardioversion but was in normal rhythm at that time.  A couple days later he went for a CT scan and he was in atrial fibrillation and could not have this performed.  We had lengthy discussion about rate versus rhythm control and given that he is asymptomatic, we will continue with rate control strategy.  Denies any fevers chills nausea vomiting syncope bleeding.  Past Medical History:  Diagnosis Date  . Alcoholism in remission ([place]) 08/25/2011  . Allergy    seasonal  . CAD (coronary artery disease) 08/25/2011  .  Depression   . Gout   . Hyperlipidemia   . Hypertension   . Sleep apnea    did use cpap about 10 years ago no use now  . Stroke [facility])     Past Surgical History:  Procedure Laterality Date  . COLONOSCOPY  10-22-11   3 polyps  . FINGER NEUROPLASTY     left index finger  . left achillies  2003  . POLYPECTOMY      Current Medications: Current Meds  Medication Sig  . apixaban (ELIQUIS) 5 MG TABS tablet Take 1 tablet (5 mg total) by mouth 2 (two) times daily.  Marland Kitchen atorvastatin (LIPITOR) 80 MG tablet Take 1 tablet (80 mg total) by mouth daily.  . colchicine 0.6 MG tablet Take 1 tablet (0.6 mg total) by mouth daily as needed. 1 tab by mouth every hour until pain improved or diarrhea  . ezetimibe (ZETIA) 10 MG tablet Take 1 tablet (10 mg total) by mouth daily.  Marland Kitchen lisinopril (PRINIVIL,ZESTRIL) 5 MG tablet Take 1 tablet (5 mg total) by mouth daily.  . metoprolol succinate (TOPROL-XL) 100 MG 24 hr tablet Take 1 tablet (100 mg total) by mouth daily. Take with or immediately following a meal.  . montelukast (SINGULAIR) 10 MG tablet Take 10 mg by mouth daily as needed (allergies).     Allergies:   Penicillins   Social History  Socioeconomic History  . Marital status: Married    Spouse name: Not on file  . Number of children: 2  . Years of education: Not on file  . Highest education level: Not on file  Occupational History  . Occupation: Teacher, adult education    Comment: [employer]  . Financial resource strain: Not on file  . Food insecurity:    Worry: Not on file    Inability: Not on file  . Transportation needs:    Medical: Not on file    Non-medical: Not on file  Tobacco Use  . Smoking status: Never Smoker  . Smokeless tobacco: Never Used  Substance and Sexual Activity  . Alcohol use: No  . Drug use: No  . Sexual activity: Not on file  Lifestyle  . Physical activity:    Days per week: Not on file    Minutes per session: Not on file  . Stress: Not on file    Relationships  . Social connections:    Talks on phone: Not on file    Gets together: Not on file    Attends religious service: Not on file    Active member of club or organization: Not on file    Attends meetings of clubs or organizations: Not on file    Relationship status: Not on file  Other Topics Concern  . Not on file  Social History Narrative  . Not on file     Family History: The patient's family history includes Diabetes in his mother; Heart attack in his brother; Hypertension in his mother. There is no history of Colon cancer, Stomach cancer, Rectal cancer, or Esophageal cancer.  ROS:   Please see the history of present illness.     All other systems reviewed and are negative.  EKGs/Labs/Other Studies Reviewed:    The following studies were reviewed today: Echo 01/06/18: Study Conclusions - Left ventricle: The cavity size was normal. Systolic function was mildly to moderately reduced. The estimated ejection fraction was in the range of 40% to 45%. Moderate diffuse hypokinesis with no identifiable regional variations. - Aortic valve: There was mild regurgitation. - Left atrium: The atrium was mildly dilated. - Right atrium: The atrium was mildly dilated. - Pulmonary arteries: PA peak pressure: 31 mm Hg (S).    EKG: 02/28/2018-atrial fibrillation heart rate 81 bpm no other abnormalities.  Recent Labs: 01/06/2018: TSH 1.077 02/05/2018: ALT 36; BUN 15; Creatinine, Ser 1.11; Hemoglobin 14.7; Platelets 169; Potassium 4.0; Sodium 141  Recent Lipid Panel    Component Value Date/Time   CHOL 250 (H) 01/06/2018 0438   TRIG 86 01/06/2018 0438   HDL 41 01/06/2018 0438   CHOLHDL 6.1 01/06/2018 0438   VLDL 17 01/06/2018 0438   LDLCALC 192 (H) 01/06/2018 0438   LDLDIRECT 146.2 08/30/2011 1350    Physical Exam:    VS:  BP 110/80   Pulse 81   Ht 6' (1.829 m)   Wt 202 lb 12.8 oz (92 kg)   BMI 27.50 kg/m     Wt Readings from Last 3 Encounters:  02/28/18  202 lb 12.8 oz (92 kg)  02/05/18 205 lb 9.6 oz (93.3 kg)  01/28/18 202 lb 12.8 oz (92 kg)     GEN:  Well nourished, well developed in no acute distress HEENT: Normal NECK: No JVD; No carotid bruits LYMPHATICS: No lymphadenopathy CARDIAC: IRRR, no murmurs, rubs, gallops RESPIRATORY:  Clear to auscultation without rales, wheezing or rhonchi  ABDOMEN: Soft, non-tender, non-distended MUSCULOSKELETAL:  No edema; No deformity  SKIN: Warm and dry NEUROLOGIC:  Alert and oriented x 3 PSYCHIATRIC:  Normal affect   ASSESSMENT:    1. Atrial fibrillation with RVR ([place])   2. Essential hypertension   3. Other hyperlipidemia    PLAN:    In order of problems listed above:  Atrial fibrillation with rapid ventricular response previously, persistent - Cardioversion discussed, Truitt Merle.  When he presented for his cardioversion, he was actually back in normal rhythm so this was not performed.  However when he went for his CT scan several days later, he was back in atrial fibrillation.  Because he is asymptomatic with his atrial fibrillation, we will continue with rate control strategy.  We discussed affirm trial at length.  No changes made.  Good rate control.  Coronary artery disease -Unable to perform secondary to atrial fibrillation CT scan of coronary arteries.  At this point, he is not having any anginal symptoms.  I am comfortable with continuing current medical management.  Exercise.  Repeating echocardiogram 1 year post would be reasonable.  Continue with statin therapy.  Stroke - Has made a remarkable recovery.   Medication Adjustments/Labs and Tests Ordered: Current medicines are reviewed at length with the patient today.  Concerns regarding medicines are outlined above.  Orders Placed This Encounter  Procedures  . EKG 12-Lead   No orders of the defined types were placed in this encounter.   Patient Instructions  Medication Instructions:  The current medical regimen is  effective;  continue present plan and medications.  Follow-Up: Follow up in 3 months with Truitt Merle, NP  Follow up in 6 months with Dr. Marlou Porch.  You will receive a letter in the mail 2 months before you are due.  Please call us when you receive this letter to schedule your follow up appointment.  If you need a refill on your cardiac medications before your next appointment, please call your pharmacy.  Thank you for choosing [facility]!!        Signed, Candee Furbish, MD  02/28/2018 10:07 AM    [place]

## 2018-02-28 NOTE — Patient Instructions (Signed)
Medication Instructions:  The current medical regimen is effective;  continue present plan and medications.  Follow-Up: Follow up in 3 months with Truitt Merle, NP  Follow up in 6 months with Dr. Marlou Porch.  You will receive a letter in the mail 2 months before you are due.  Please call us when you receive this letter to schedule your follow up appointment.  If you need a refill on your cardiac medications before your next appointment, please call your pharmacy.  Thank you for choosing [facility]!!

## 2018-03-03 ENCOUNTER — Ambulatory Visit: Payer: Federal, State, Local not specified - PPO | Admitting: Occupational Therapy

## 2018-03-08 ENCOUNTER — Ambulatory Visit (HOSPITAL_BASED_OUTPATIENT_CLINIC_OR_DEPARTMENT_OTHER): Payer: Federal, State, Local not specified - PPO | Attending: Nurse Practitioner | Admitting: Cardiology

## 2018-03-08 VITALS — Ht 72.0 in | Wt 194.0 lb

## 2018-03-08 DIAGNOSIS — I4891 Unspecified atrial fibrillation: Secondary | ICD-10-CM | POA: Diagnosis not present

## 2018-03-08 DIAGNOSIS — I493 Ventricular premature depolarization: Secondary | ICD-10-CM | POA: Diagnosis not present

## 2018-03-08 DIAGNOSIS — R0683 Snoring: Secondary | ICD-10-CM

## 2018-03-08 DIAGNOSIS — G4733 Obstructive sleep apnea (adult) (pediatric): Secondary | ICD-10-CM | POA: Diagnosis not present

## 2018-03-09 NOTE — Procedures (Addendum)
   Patient Name: Richard [name], Richard [name] Date: 03/08/2018 Gender: Male D.O.B: 1956-01-18 Age (years): 62 Referring Provider: Truitt Merle Height (inches): 6 Interpreting Physician: Fransico Him MD, ABSM Weight (lbs): 197 RPSGT: [place], [place] BMI: 27 MRN: [id number] Neck Size: 17.00  CLINICAL INFORMATION Sleep Study Type: NPSG  Indication for sleep study: Hypertension, Snoring  Epworth Sleepiness Score: 2  SLEEP STUDY TECHNIQUE As per the AASM Manual for the Scoring of Sleep and Associated Events v2.3 (April 2016) with a hypopnea requiring 4% desaturations.  The channels recorded and monitored were frontal, central and occipital EEG, electrooculogram (EOG), submentalis EMG (chin), nasal and oral airflow, thoracic and abdominal wall motion, anterior tibialis EMG, snore microphone, electrocardiogram, and pulse oximetry.  MEDICATIONS Medications self-administered by patient taken the night of the study : N/A  SLEEP ARCHITECTURE The study was initiated at 11:44:04 PM and ended at 5:45:00 AM.  Sleep onset time was 31.4 minutes and the sleep efficiency was 54.6%%. The total sleep time was 197 minutes.  Stage REM latency was 61.0 minutes.  The patient spent 24.4%% of the night in stage N1 sleep, 47.0%% in stage N2 sleep, 0.8%% in stage N3 and 27.9% in REM.  Alpha intrusion was absent.  Supine sleep was 32.99%.  RESPIRATORY PARAMETERS The overall apnea/hypopnea index (AHI) was 7.9 per hour. There were 13 total apneas, including 8 obstructive, 5 central and 0 mixed apneas. There were 13 hypopneas and 58 RERAs.  The AHI during Stage REM sleep was 17.5 per hour.  AHI while supine was 21.2 per hour.  The mean oxygen saturation was 94.7%. The minimum SpO2 during sleep was 88.0%.  soft snoring was noted during this study.  CARDIAC DATA The 2 lead EKG demonstrated sinus rhythm. The mean heart rate was 45.2 beats per minute. Other EKG findings include: PVCs.  LEG MOVEMENT  DATA The total PLMS were 0 with a resulting PLMS index of 0.0. Associated arousal with leg movement index was 0.0 .  IMPRESSIONS - Mild obstructive sleep apnea occurred during this study (AHI = 7.9/h). - No significant central sleep apnea occurred during this study (CAI = 1.5/h). - The patient had minimal or no oxygen desaturation during the study (Min O2 = 88.0%) - The patient snored with soft snoring volume. - EKG findings include PVCs. - Clinically significant periodic limb movements did not occur during sleep. No significant associated arousals.  DIAGNOSIS - Obstructive Sleep Apnea (327.23 [G47.33 ICD-10])  RECOMMENDATIONS - Recommend CPAP titration due to significantly elevated AHI during REM sleep despite only mild OSA overall.   - Positional therapy avoiding supine position during sleep. - Avoid alcohol, sedatives and other CNS depressants that may worsen sleep apnea and disrupt normal sleep architecture. - Sleep hygiene should be reviewed to assess factors that may improve sleep quality. - Weight management and regular exercise should be initiated or continued if appropriate.  [Electronically signed] 03/09/2018 07:09 PM  Fransico Him MD, ABSM Diplomate, American Board of Sleep Medicine

## 2018-03-14 ENCOUNTER — Telehealth: Payer: Self-pay | Admitting: *Deleted

## 2018-03-14 DIAGNOSIS — G4733 Obstructive sleep apnea (adult) (pediatric): Secondary | ICD-10-CM

## 2018-03-14 NOTE — Telephone Encounter (Signed)
-----   Message from Sueanne Margarita, MD sent at 03/09/2018  7:11 PM EDT ----- Please let patient know that they have sleep apnea and recommend CPAP titration. Please set up titration in the sleep lab.

## 2018-03-17 NOTE — Telephone Encounter (Signed)
Called results lmtcb on home phone. 

## 2018-03-21 NOTE — Telephone Encounter (Signed)
Informed patient of sleep study results and patient understanding was verbalized. Patient understands his sleep study showed  they have sleep apnea and recommend CPAP titration.  Pt is aware and agreeable to being set up titration in the sleep lab.

## 2018-03-26 ENCOUNTER — Telehealth: Payer: Self-pay | Admitting: *Deleted

## 2018-03-26 NOTE — Telephone Encounter (Signed)
-----   Message from Freada Bergeron, [place] sent at 03/21/2018  3:06 PM EDT ----- Regarding: pre cert cpap titration

## 2018-03-26 NOTE — Telephone Encounter (Signed)
PA submitted for CPAP titration over the phone with [place] faxed to [phone] per their request. Reference # [id number].

## 2018-03-27 ENCOUNTER — Telehealth: Payer: Self-pay | Admitting: *Deleted

## 2018-03-27 NOTE — Telephone Encounter (Signed)
Staff message sent to [place] received. Ok to schedule CPAP titration. Auth # [id number]. Valid dates 04/28/18 to 05/28/18.

## 2018-03-27 NOTE — Telephone Encounter (Signed)
Patient is scheduled for CPAP Titration on 05/03/18. Patient understands his titration study will be done at [facility] sleep lab. Patient understands he will receive a letter in a week or so detailing appointment, date, time, and location. Patient understands to call if he does not receive the letter  in a timely manner. Patient agrees with treatment and thanked me for call.

## 2018-03-27 NOTE — Telephone Encounter (Signed)
-----   Message from Freada Bergeron, [place] sent at 03/21/2018  3:06 PM EDT ----- Regarding: pre cert cpap titration

## 2018-04-07 DIAGNOSIS — K080 Exfoliation of teeth due to systemic causes: Secondary | ICD-10-CM | POA: Diagnosis not present

## 2018-04-15 DIAGNOSIS — K080 Exfoliation of teeth due to systemic causes: Secondary | ICD-10-CM | POA: Diagnosis not present

## 2018-04-18 DIAGNOSIS — D2261 Melanocytic nevi of right upper limb, including shoulder: Secondary | ICD-10-CM | POA: Diagnosis not present

## 2018-04-18 DIAGNOSIS — D2262 Melanocytic nevi of left upper limb, including shoulder: Secondary | ICD-10-CM | POA: Diagnosis not present

## 2018-04-18 DIAGNOSIS — L821 Other seborrheic keratosis: Secondary | ICD-10-CM | POA: Diagnosis not present

## 2018-04-18 DIAGNOSIS — L814 Other melanin hyperpigmentation: Secondary | ICD-10-CM | POA: Diagnosis not present

## 2018-05-03 ENCOUNTER — Ambulatory Visit (HOSPITAL_BASED_OUTPATIENT_CLINIC_OR_DEPARTMENT_OTHER): Payer: Federal, State, Local not specified - PPO | Attending: Cardiology | Admitting: Cardiology

## 2018-05-03 VITALS — Ht 72.0 in | Wt 197.0 lb

## 2018-05-03 DIAGNOSIS — I4891 Unspecified atrial fibrillation: Secondary | ICD-10-CM | POA: Diagnosis not present

## 2018-05-03 DIAGNOSIS — G4733 Obstructive sleep apnea (adult) (pediatric): Secondary | ICD-10-CM | POA: Diagnosis not present

## 2018-05-05 NOTE — Progress Notes (Signed)
Noted. Has documented PAF - on anticoagulation.

## 2018-05-05 NOTE — Procedures (Signed)
   Patient Name: Richard [name], Richard [name] Date: 05/03/2018 Gender: Male D.O.B: July 21, 1955 Age (years): 8 Referring Provider: Fransico Him MD, ABSM Height (inches): 72 Interpreting Physician: Fransico Him MD, ABSM Weight (lbs): 197 RPSGT: Lanae Boast BMI: 27 MRN: [id number] Neck Size: 17.00  CLINICAL INFORMATION  The patient is referred for a CPAP titration to treat sleep apnea.  SLEEP STUDY TECHNIQUE  As per the AASM Manual for the Scoring of Sleep and Associated Events v2.3 (April 2016) with a hypopnea requiring 4% desaturations. The channels recorded and monitored were frontal, central and occipital EEG, electrooculogram (EOG), submentalis EMG (chin), nasal and oral airflow, thoracic and abdominal wall motion, anterior tibialis EMG, snore microphone, electrocardiogram, and pulse oximetry. Continuous positive airway pressure (CPAP) was initiated at the beginning of the study and titrated to treat sleep-disordered breathing.  MEDICATIONS  Medications self-administered by patient taken the night of the study : N/A  TECHNICIAN COMMENTS  Comments added by technician: Patient had difficulty initiating sleep. Comments added by scorer: N/A  RESPIRATORY PARAMETERS  Optimal PAP Pressure (cm):11 AHI at Optimal Pressure (/hr):2.8 Overall Minimal O2 (%):91.0 Supine % at Optimal Pressure (%):100 Minimal O2 at Optimal Pressure (%):92.0  SLEEP ARCHITECTURE  The study was initiated at 9:47:38 PM and ended at 3:50:42 AM. Sleep onset time was 30.6 minutes and the sleep efficiency was 42.6%%. The total sleep time was 154.5 minutes. The patient spent 43.0%% of the night in stage N1 sleep, 46.0%% in stage N2 sleep, 0.0%% in stage N3 and 11% in REM.Stage REM latency was 241.5 minutes Wake after sleep onset was 177.9. Alpha intrusion was absent. Supine sleep was 65.37%.  CARDIAC DATA  The 2 lead EKG demonstrated sinus rhythm. The mean heart rate was 65.3 beats per minute. Other EKG  findings include: Atrial Fibrillation.  LEG MOVEMENT DATA  The total Periodic Limb Movements of Sleep (PLMS) were 0. The PLMS index was 0.0. A PLMS index of <15 is considered normal in adults.  IMPRESSIONS  - The optimal PAP pressure was 11 cm of water. - Central sleep apnea was not noted during this titration (CAI = 1.6/h). - Significant oxygen desaturations were not observed during this titration (min O2 = 91.0%). - The patient snored with soft snoring volume during this titration study. - 2-lead EKG demonstrated: NSR at the start of procedure but converted to atrial fibrillation and remained in this for the duration of the study.  - Clinically significant periodic limb movements were not noted during this study. Arousals associated with PLMs were rare.  DIAGNOSIS  - Obstructive Sleep Apnea (327.23 [G47.33 ICD-10]) - Atrial Fibrillation  RECOMMENDATIONS  - Trial of CPAP therapy on 11 cm H2O with a Medium Wide size Philips Respironics Full Face Mask Dreamwear mask and heated humidification. - Avoid alcohol, sedatives and other CNS depressants that may worsen sleep apnea and disrupt normal sleep architecture. - Sleep hygiene should be reviewed to assess factors that may improve sleep quality. - Weight management and regular exercise should be initiated or continued. - Return to Sleep Center for re-evaluation after 10 weeks of therapy  [Electronically signed] 05/05/2018 11:17 AM  Fransico Him MD, ABSM Diplomate, American Board of Sleep Medicine

## 2018-05-07 NOTE — Progress Notes (Signed)
[facility] [address] [place]. [place] [address] [phone]       OFFICE FOLLOW UP NOTE  [name] Date of Birth:  April 01, 1956 Medical Record Number:  [id number]   Reason for Referral:  hospital stroke follow up  CHIEF COMPLAINT:  Chief Complaint  Patient presents with  . Follow-up    3 month follow up. Wife present. Treatment room. Patient would like the difference between indigestion and chest pain.    HPI: Richard [name] is being seen today for initial visit in the office for left parietal temporal cortical infarcts due to new onset AF on 01/05/2018. History obtained from patient, wife and chart review. Reviewed all radiology images and labs personally.  Mr. Abdoulaye Drum is a 62 y.o. male with history of ETOH abuse in remission x 12 yrs, HTN, HLD, OSA who presented with right-sided weakness, right facial droop and global aphasia.  During transport to ED, EMS found atrial fibrillation on EKG and patient denies prior history or diagnosis of AF.  AF further confirmed on EKG performed at [facility] ED.  Patient was not on antithrombotic PTA.  CT had reviewed and was negative for acute stroke.  CTA head and neck was negative for significant stenosis.  CT perfusion was negative.  MRI head reviewed and showed patchy small left parietal temporal cortical infarcts with associated petechial hemorrhage.  EEG negative for seizure activity.  2D echo showed an EF of 40 to 45%.  LDL 192 and recommended continuation of Lipitor 80 mg along with addition of Zetia.  HTN stable during admission and recommended long-term BP goal.  A1c satisfactory at 5.5.  Patient does have history of OSA not on CPAP.  Patient was discharged home without therapy needs. Patient was referred to cardiology for follow-up on 01/20/2018.  Referral for sleep study was placed.  Patient was discharged on aspirin 325 along with full anticoagulation with Eliquis and is recommended to stop aspirin as there is no  indication for continuation.  02/05/2018 visit: Patient is being seen today for hospital stroke follow-up and is accompanied by his wife.  He continues to do well since hospital discharge with only mild residual deficit of right hand weakness but otherwise states all of the weakness resolved along with dysarthria.  He continues to participate in OT at our neuro rehab clinic.  He has completed PT and speech therapy.  He continues to take Eliquis without side effects of bleeding or bruising and continues to follow-up with cardiologist along with undergoing scheduled cardioversion on 02/11/2018.  Continues to take Lipitor along with Zetia without complaints of myalgias.  Recommended PCP obtain lipid panel in 1 month to ensure decrease in LDL and if LDL not decreased, recommend consideration of starting Repatha.  Patient states that he was not consistently taking Lipitor 80 mg prior to his stroke but has been compliant with all prescribed medications since his stroke.  He also has decrease his sodium intake along with avoiding red meats and increasing intake of fish and occasionally chicken.  Blood pressure today satisfactory 114/69.  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  Denies new or worsening stroke/TIA symptoms.  Interval history 05/08/2018: Patient is being seen today for 13-month follow-up visit and is accompanied by his wife. Patient has been doing well. Has completed OT with improvement in hand writing. He denies any additional weakness. Continues to take eliquis without bleeding or bruising. Continues on lipitor and Zetia without  side effects of myalgias. Blood pressure satisfactory at 119/78. He does continue to monitor at home and this is a typical level for him. He doe s have hx of OSA and states compliance with CPAP. He has returned back to all his prior activities including wood working. Denies new or worsening stroke/TIA symptoms.      ROS:   14  system review of systems performed and negative with exception of no complaints  PMH:  Past Medical History:  Diagnosis Date  . Alcoholism in remission ([place]) 08/25/2011  . Allergy    seasonal  . CAD (coronary artery disease) 08/25/2011  . Depression   . Gout   . Hyperlipidemia   . Hypertension   . Sleep apnea    did use cpap about 10 years ago no use now  . Stroke [facility])     PSH:  Past Surgical History:  Procedure Laterality Date  . COLONOSCOPY  10-22-11   3 polyps  . FINGER NEUROPLASTY     left index finger  . left achillies  2003  . POLYPECTOMY      Social History:  Social History   Socioeconomic History  . Marital status: Married    Spouse name: Not on file  . Number of children: 2  . Years of education: Not on file  . Highest education level: Not on file  Occupational History  . Occupation: Teacher, adult education    Comment: [employer]  . Financial resource strain: Not on file  . Food insecurity:    Worry: Not on file    Inability: Not on file  . Transportation needs:    Medical: Not on file    Non-medical: Not on file  Tobacco Use  . Smoking status: Never Smoker  . Smokeless tobacco: Never Used  Substance and Sexual Activity  . Alcohol use: No  . Drug use: No  . Sexual activity: Not on file  Lifestyle  . Physical activity:    Days per week: Not on file    Minutes per session: Not on file  . Stress: Not on file  Relationships  . Social connections:    Talks on phone: Not on file    Gets together: Not on file    Attends religious service: Not on file    Active member of club or organization: Not on file    Attends meetings of clubs or organizations: Not on file    Relationship status: Not on file  . Intimate partner violence:    Fear of current or ex partner: Not on file    Emotionally abused: Not on file    Physically abused: Not on file    Forced sexual activity: Not on file  Other Topics Concern  . Not on file  Social History Narrative  .  Not on file    Family History:  Family History  Problem Relation Age of Onset  . Hypertension Mother   . Diabetes Mother   . Heart attack Brother   . Colon cancer Neg Hx   . Stomach cancer Neg Hx   . Rectal cancer Neg Hx   . Esophageal cancer Neg Hx     Medications:   Current Outpatient Medications on File Prior to Visit  Medication Sig Dispense Refill  . apixaban (ELIQUIS) 5 MG TABS tablet Take 1 tablet (5 mg total) by mouth 2 (two) times daily. 180 tablet 3  . atorvastatin (LIPITOR) 80 MG tablet Take 1 tablet (80 mg total) by  mouth daily. 90 tablet 3  . diltiazem (CARDIZEM CD) 240 MG 24 hr capsule Take 240 mg by mouth daily.  3  . ezetimibe (ZETIA) 10 MG tablet Take 1 tablet (10 mg total) by mouth daily. 90 tablet 3  . lisinopril (PRINIVIL,ZESTRIL) 5 MG tablet Take 1 tablet (5 mg total) by mouth daily. 90 tablet 3  . metoprolol succinate (TOPROL-XL) 100 MG 24 hr tablet Take 1 tablet (100 mg total) by mouth daily. Take with or immediately following a meal. 90 tablet 3  . colchicine 0.6 MG tablet Take 1 tablet (0.6 mg total) by mouth daily as needed. 1 tab by mouth every hour until pain improved or diarrhea (Patient not taking: Reported on 05/08/2018) 40 tablet 1  . colchicine 0.6 MG tablet Take 0.6 mg by mouth daily as needed (gout).    . montelukast (SINGULAIR) 10 MG tablet Take 10 mg by mouth daily as needed (allergies).     No current facility-administered medications on file prior to visit.     Allergies:   Allergies  Allergen Reactions  . Penicillins Other (See Comments)    Siblings are allergic Has patient had a PCN reaction causing immediate rash, facial/tongue/throat swelling, SOB or lightheadedness with hypotension: Unknown Has patient had a PCN reaction causing severe rash involving mucus membranes or skin necrosis: Unknown Has patient had a PCN reaction that required hospitalization: Unknown Has patient had a PCN reaction occurring within the last 10 years:  Unknown If all of the above answers are "NO", then may proceed with Cephalosporin use.     Physical Exam  Vitals:   05/08/18 1306  BP: 119/78  Pulse: 73  Weight: 203 lb (92.1 kg)  Height: 6' (1.829 m)   Body mass index is 27.53 kg/m. No exam data present  General: well developed, well nourished, pleasant middle-aged Caucasian male, seated, in no evident distress Head: head normocephalic and atraumatic.   Neck: supple with no carotid or supraclavicular bruits Cardiovascular: irregular rate and rhythm, no murmurs Musculoskeletal: no deformity Skin:  no rash/petichiae Vascular:  Normal pulses all extremities  Neurologic Exam Mental Status: Awake and fully alert. Oriented to place and time. Recent and remote memory intact. Attention span, concentration and fund of knowledge appropriate. Mood and affect appropriate.  Cranial Nerves: Fundoscopic exam reveals sharp disc margins. Pupils equal, briskly reactive to light. Extraocular movements full without nystagmus. Visual fields full to confrontation. Hearing intact. Facial sensation intact. Face, tongue, palate moves normally and symmetrically.  Motor: Normal bulk and tone. Normal strength in all tested extremity muscles. Sensory.: intact to touch , pinprick , position and vibratory sensation.  Coordination: Rapid alternating movements normal in all extremities. Finger-to-nose and heel-to-shin performed accurately bilaterally.   Gait and Station: Arises from chair without difficulty. Stance is normal. Gait demonstrates normal stride length and balance . Able to heel, toe and tandem walk without difficulty.  Reflexes: 1+ and symmetric. Toes downgoing.      Diagnostic Data (Labs, Imaging, Testing)  CT HEAD WO CONTRAST 01/05/2018 IMPRESSION: 1. Negative exam 2. ASPECTS is 10.  MR BRAIN WO CONTRAST 01/05/2018 IMPRESSION: 1. Patchy small volume acute ischemic cortical infarcts involving the left parietotemporal region as above.  Scant associated petechial hemorrhage without hemorrhagic transformation or mass effect. 2. Otherwise normal brain MRI.  CT ANGIO HEAD W OR WO CONTRAST CT ANGIO NECK W OR WO CONTRAST 01/05/2018 IMPRESSION: No intracranial or extracranial stenosis or occlusion. Mild irregularity of the distal MCA branches suggesting intracranial atherosclerotic change.  ECHOCARDIOGRAM 01/06/2018 Study Conclusions  - Left ventricle: The cavity size was normal. Systolic function was   mildly to moderately reduced. The estimated ejection fraction was   in the range of 40% to 45%. Moderate diffuse hypokinesis with no   identifiable regional variations. - Aortic valve: There was mild regurgitation. - Left atrium: The atrium was mildly dilated. - Right atrium: The atrium was mildly dilated. - Pulmonary arteries: PA peak pressure: 31 mm Hg (S   ASSESSMENT: KEIDEN DESKIN is a 62 y.o. year old male here with left parietal temporal region infarct on 01/05/2018 secondary to new onset AF not on AC. Vascular risk factors include new onset AF, HLD, HTN and OSA.  Patient is being seen today for follow-up visit and overall has was recovered well from stroke standpoint without residual deficits or recurring of symptoms.    PLAN: -Continue Eliquis (apixaban) daily  and Lipitor and Zetia for secondary stroke prevention -Check lipid panel today and if remains elevated, consider initiating Repatha for HLD management -F/u with PCP regarding your HTN and HTN management -f/u with cardiologist for continued management of AF and AC  -continue to monitor BP at home -advised to continue to stay active and maintain a healthy diet -Maintain strict control of hypertension with blood pressure goal below 130/90, diabetes with hemoglobin A1c goal below 6.5% and cholesterol with LDL cholesterol (bad cholesterol) goal below 70 mg/dL. I also advised the patient to eat a healthy diet with plenty of whole grains, cereals, fruits and  vegetables, exercise regularly and maintain ideal body weight.  Follow up in 6 months or call earlier if needed   Greater than 50% of time during this 25 minute visit was spent on counseling,explanation of diagnosis of left parietal temporal region infarct, reviewing risk factor management of AF, HLD, HTN and OSA, planning of further management, discussion with patient and family and coordination of care    Venancio Poisson, AGNP-BC  [facility] [address] [place], [place] [address]  Phone [phone] Fax [phone] Note: This document was prepared with digital dictation and possible smart phrase technology. Any transcriptional errors that result from this process are unintentional.

## 2018-05-08 ENCOUNTER — Encounter: Payer: Self-pay | Admitting: Adult Health

## 2018-05-08 ENCOUNTER — Ambulatory Visit: Payer: Federal, State, Local not specified - PPO | Admitting: Adult Health

## 2018-05-08 VITALS — BP 119/78 | HR 73 | Ht 72.0 in | Wt 203.0 lb

## 2018-05-08 DIAGNOSIS — I480 Paroxysmal atrial fibrillation: Secondary | ICD-10-CM

## 2018-05-08 DIAGNOSIS — I10 Essential (primary) hypertension: Secondary | ICD-10-CM

## 2018-05-08 DIAGNOSIS — E7800 Pure hypercholesterolemia, unspecified: Secondary | ICD-10-CM

## 2018-05-08 DIAGNOSIS — I639 Cerebral infarction, unspecified: Secondary | ICD-10-CM

## 2018-05-08 NOTE — Patient Instructions (Signed)
Continue Eliquis (apixaban) daily  and lipitor and zetia  for secondary stroke prevention  We will check cholesterol panel today to ensure adequate therapy for current regimen  Continue to follow up with PCP regarding cholesterol and blood pressure management   Continue to follow up with cardiologist for atrial fibrillation and eliquis management  Continue to stay active and maintain a healthy diet  Continue to monitor blood pressure at home  Maintain strict control of hypertension with blood pressure goal below 130/90, diabetes with hemoglobin A1c goal below 6.5% and cholesterol with LDL cholesterol (bad cholesterol) goal below 70 mg/dL. I also advised the patient to eat a healthy diet with plenty of whole grains, cereals, fruits and vegetables, exercise regularly and maintain ideal body weight.  Followup in the future with me in 6 months or call earlier if needed       Thank you for coming to see [place] at [facility]. I hope we have been able to provide you high quality care today.  You may receive a patient satisfaction survey over the next few weeks. We would appreciate your feedback and comments so that we may continue to improve ourselves and the health of our patients.

## 2018-05-08 NOTE — Progress Notes (Signed)
I agree with the above plan 

## 2018-05-09 ENCOUNTER — Telehealth: Payer: Self-pay | Admitting: *Deleted

## 2018-05-09 LAB — LIPID PANEL
CHOLESTEROL TOTAL: 116 mg/dL (ref 100–199)
Chol/HDL Ratio: 2.7 ratio (ref 0.0–5.0)
HDL: 43 mg/dL (ref >39)
LDL Calculated: 58 mg/dL (ref 0–99)
TRIGLYCERIDES: 76 mg/dL (ref 0–149)
VLDL Cholesterol Cal: 15 mg/dL (ref 5–40)

## 2018-05-09 NOTE — Telephone Encounter (Signed)
Informed patient of sleep study results and patient understanding was verbalized. Patient understands his sleep study showed they had a successful PAP titration and orders are in EPIC. Please set up 10 week OV with me.   Upon patient request DME selection is [facility]. Patient understands he will be contacted by [facility] to set up his cpap. Patient understands to call if [facility] does not contact him with new setup in a timely manner. Patient understands they will be called once confirmation has been received from [facility] that they have received their new machine to schedule 10 week follow up appointment.  [facility] notified of new cpap order  Please add to airview Patient was grateful for the call and thanked me. 

## 2018-05-09 NOTE — Telephone Encounter (Signed)
-----   Message from Sueanne Margarita, MD sent at 05/05/2018 11:20 AM EST ----- Please let patient know that they had a successful PAP titration and let DME know that orders are in EPIC.  Please set up 10 week OV with me.

## 2018-05-12 ENCOUNTER — Telehealth: Payer: Self-pay

## 2018-05-12 NOTE — Telephone Encounter (Signed)
-----   Message from Venancio Poisson, NP sent at 05/09/2018  7:18 AM EST ----- Please let patient know that his cholesterol levels look great! His bad cholesterol (LDL) is at 58 and total cholesterol 116. We will continue current treatment plan.

## 2018-05-12 NOTE — Telephone Encounter (Signed)
Unable to get in contact with Richard [name]. A detailed message was left on his answer machine( DPR verified) with his results. Office number was provided in case he has any questions or concerns.

## 2018-05-12 NOTE — Telephone Encounter (Signed)
Spoke with the patient and he verbalized understanding his results. No questions or concerns at this time.   

## 2018-05-27 DIAGNOSIS — G4733 Obstructive sleep apnea (adult) (pediatric): Secondary | ICD-10-CM | POA: Diagnosis not present

## 2018-06-09 ENCOUNTER — Ambulatory Visit (INDEPENDENT_AMBULATORY_CARE_PROVIDER_SITE_OTHER): Payer: Federal, State, Local not specified - PPO | Admitting: Nurse Practitioner

## 2018-06-09 ENCOUNTER — Encounter: Payer: Self-pay | Admitting: Nurse Practitioner

## 2018-06-09 VITALS — BP 104/70 | HR 72 | Ht 72.0 in | Wt 203.0 lb

## 2018-06-09 DIAGNOSIS — I4891 Unspecified atrial fibrillation: Secondary | ICD-10-CM | POA: Diagnosis not present

## 2018-06-09 DIAGNOSIS — Z79899 Other long term (current) drug therapy: Secondary | ICD-10-CM | POA: Diagnosis not present

## 2018-06-09 NOTE — Patient Instructions (Addendum)
We will be checking the following labs today - BMET & CBC  If you have labs (blood work) drawn today and your tests are completely normal, you will receive your results only by: Marland Kitchen MyChart Message (if you have MyChart) OR . A paper copy in the mail If you have any lab test that is abnormal or we need to change your treatment, we will call you to review the results.   Medication Instructions:    Continue with your current medicines.    If you need a refill on your cardiac medications before your next appointment, please call your pharmacy.     Testing/Procedures To Be Arranged:  N/A  Follow-Up:   See Dr. Marlou Porch in 6 months.     At [facility], you and your health needs are our priority.  As part of our continuing mission to provide you with exceptional heart care, we have created designated Provider Care Teams.  These Care Teams include your primary Cardiologist (physician) and Advanced Practice Providers (APPs -  Physician Assistants and Nurse Practitioners) who all work together to provide you with the care you need, when you need it.  Special Instructions:  . None  Call the [place] office at [phone] if you have any questions, problems or concerns.

## 2018-06-09 NOTE — Progress Notes (Signed)
CARDIOLOGY OFFICE NOTE  Date:  06/09/2018    Richard [name] Date of Birth: [birth year] Medical Record #[id number]  PCP:  Biagio Borg, MD  Cardiologist:  Marisa Cyphers    Chief Complaint  Patient presents with  . Atrial Fibrillation    3 month check - seen for Dr.Skains    History of Present Illness: Richard [name] is a 62 y.o. male who presents today for a follow up visit. Former patient of Dr. Doug Sou - not seen since 2012 - now followed by Dr. Marlou Porch.   He has a history ofhypertension, hyperlipidemia,&sleep apnea. He has had a prior cardiac CT showing proximal LAD disease - not obstructive in 2012 (less than 40%).   Presented back in the fall of 2019 with dysarthria and right upper extremity weakness. He was also found to be in A. fib with RVR on presentation. MRI brain was performed which revealed small volume acute ischemic cortical infarct involving the left parietal temporal region, no hemorrhage transformation or mass effect noted. Neurology was consulted. Cardiology also consulted for new onset A. fib with RVR.His echocardiogram also showed reduced ejection fraction to 40 to 45%. Medicationswereadjusted. Weakness on his right upper extremityimproved.   I saw him post op - tried to get a cardiac CT - plan was to try cardioversion as well after 4 weeks of anticoagulation - then was in NSR for the DCCV - this was cancelled. His FitBit has tended to NOT correlate with HR. He was sent for sleep study. He is unaware of his AF.   Last seen in September of 2019 by Dr. Marlou Porch - was doing well. Plan was to be for rate control and continued anticoagulation.   Comes in today. Here with his wife. 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 Handwriting has improved. No chest pain. Some right shoulder pain - clearly with movement. No bleeding/excessive bruising.  Lots of stress with some family  issues. He has questions about his CPAP - says the tech told him at his sleep study that he did not meet criteria - then called and told that CPAP was ordered. Sleep study read by Dr. Radford Pax and did indicate OSA - he is a little frustrated about the communication with this. Otherwise, felt to be doing ok.   Past Medical History:  Diagnosis Date  . Alcoholism in remission ([place]) 08/25/2011  . Allergy    seasonal  . CAD (coronary artery disease) 08/25/2011  . Depression   . Gout   . Hyperlipidemia   . Hypertension   . Sleep apnea    did use cpap about 10 years ago no use now  . Stroke [facility])     Past Surgical History:  Procedure Laterality Date  . COLONOSCOPY  10-22-11   3 polyps  . FINGER NEUROPLASTY     left index finger  . left achillies  2003  . POLYPECTOMY       Medications: Current Meds  Medication Sig  . apixaban (ELIQUIS) 5 MG TABS tablet Take 1 tablet (5 mg total) by mouth 2 (two) times daily.  Marland Kitchen atorvastatin (LIPITOR) 80 MG tablet Take 1 tablet (80 mg total) by mouth daily.  . colchicine 0.6 MG tablet Take 1 tablet (0.6 mg total) by mouth daily as needed. 1 tab by mouth every hour until pain improved or diarrhea  . colchicine 0.6 MG tablet Take 0.6 mg by mouth  daily as needed (gout).  Marland Kitchen diltiazem (CARDIZEM CD) 240 MG 24 hr capsule Take 240 mg by mouth daily.  Marland Kitchen ezetimibe (ZETIA) 10 MG tablet Take 1 tablet (10 mg total) by mouth daily.  Marland Kitchen lisinopril (PRINIVIL,ZESTRIL) 5 MG tablet Take 1 tablet (5 mg total) by mouth daily.  . metoprolol succinate (TOPROL-XL) 100 MG 24 hr tablet Take 1 tablet (100 mg total) by mouth daily. Take with or immediately following a meal.  . montelukast (SINGULAIR) 10 MG tablet Take 10 mg by mouth daily as needed (allergies).     Allergies: Allergies  Allergen Reactions  . Penicillins Other (See Comments)    Siblings are allergic Has patient had a PCN reaction causing immediate rash, facial/tongue/throat swelling, SOB or lightheadedness with  hypotension: Unknown Has patient had a PCN reaction causing severe rash involving mucus membranes or skin necrosis: Unknown Has patient had a PCN reaction that required hospitalization: Unknown Has patient had a PCN reaction occurring within the last 10 years: Unknown If all of the above answers are "NO", then may proceed with Cephalosporin use.    Social History: The patient  reports that he has never smoked. He has never used smokeless tobacco. He reports that he does not drink alcohol or use drugs.   Family History: The patient's family history includes Diabetes in his mother; Heart attack in his brother; Hypertension in his mother.   Review of Systems: Please see the history of present illness.   Otherwise, the review of systems is positive for none.   All other systems are reviewed and negative.   Physical Exam: VS:  BP 104/70   Pulse 72   Ht 6' (1.829 m)   Wt 203 lb (92.1 kg)   BMI 27.53 kg/m  .  BMI Body mass index is 27.53 kg/m.  Wt Readings from Last 3 Encounters:  06/09/18 203 lb (92.1 kg)  05/08/18 203 lb (92.1 kg)  05/03/18 197 lb (89.4 kg)    General: Pleasant. Well developed, well nourished and in no acute distress.   HEENT: Normal.  Neck: Supple, no JVD, carotid bruits, or masses noted.  Cardiac: Regular rate and rhythm. No murmurs, rubs, or gallops. No edema.  Respiratory:  Lungs are clear to auscultation bilaterally with normal work of breathing.  GI: Soft and nontender.  MS: No deformity or atrophy. Gait and ROM intact.  Skin: Warm and dry. Color is normal.  Neuro:  Strength and sensation are intact and no gross focal deficits noted.  Psych: Alert, appropriate and with normal affect.   LABORATORY DATA:  EKG:  EKG is not ordered today.  Lab Results  Component Value Date   WBC 6.9 02/05/2018   HGB 14.7 02/05/2018   HCT 42.8 02/05/2018   PLT 169 02/05/2018   GLUCOSE 91 02/05/2018   CHOL 116 05/08/2018   TRIG 76 05/08/2018   HDL 43 05/08/2018    LDLDIRECT 146.2 08/30/2011   LDLCALC 58 05/08/2018   ALT 36 02/05/2018   AST 24 02/05/2018   NA 141 02/05/2018   K 4.0 02/05/2018   CL 105 02/05/2018   CREATININE 1.11 02/05/2018   BUN 15 02/05/2018   CO2 21 02/05/2018   TSH 1.077 01/06/2018   PSA 1.90 11/01/2017   INR 0.98 01/05/2018   HGBA1C 5.5 01/06/2018     BNP (last 3 results) No results for input(s): BNP in the last 8760 hours.  ProBNP (last 3 results) No results for input(s): PROBNP in the last 8760 hours.  Other Studies Reviewed Today:  Echo 01/06/18: Study Conclusions - Left ventricle: The cavity size was normal. Systolic function was mildly to moderately reduced. The estimated ejection fraction was in the range of 40% to 45%. Moderate diffuse hypokinesis with no identifiable regional variations. - Aortic valve: There was mild regurgitation. - Left atrium: The atrium was mildly dilated. - Right atrium: The atrium was mildly dilated. - Pulmonary arteries: PA peak pressure: 31 mm Hg (S).    Assessment/Plan:  1. AF with RVR - paroxysmal - in sinus on exam today. No changes made today. Remains on anticoagulation without any issue. Rate control is the strategy - he has not been symptomatic.   2. HTN - BP looks ok. No changes made today.   3. Prior stroke - excellent recovery  4. Situational stress - discussed at length  5. New LV systolic HF - EF of 40 to 45% - unclear how much of this is tachy mediated. Would plan to recheck echo after HR is controlled/restoration of NSR. He is on low dose ACE with beta blocker. He is doing well and has no symptoms at this time.   6. Known CAD - has had remote CT - was not able to get repeated due to having AF - does need echo in 01/2019  7. HLD - on statin  8. OSA - starting CPAP - has several questions - will see if Dr. Theodosia Blender staff can address.   Current medicines are reviewed with the patient today.  The patient does not have concerns regarding  medicines other than what has been noted above.  The following changes have been made:  See above.  Labs/ tests ordered today include:    Orders Placed This Encounter  Procedures  . Basic metabolic panel  . CBC     Disposition:   FU with Dr. Marlou Porch in 6 months - I am happy to see back as needed.   Patient is agreeable to this plan and will call if any problems develop in the interim.   SignedTruitt Merle, NP  06/09/2018 9:44 AM  [facility] [address] [place], [place]  [address] Phone: [phone] Fax: [phone]

## 2018-06-09 NOTE — Telephone Encounter (Addendum)
Patient has a 10 week follow up appointment scheduled for 08/20/18. Patient set up date was 05/27/18. Patient understands he needs to keep this appointment for insurance compliance. Left detailed message on voicemail with date and time of appt. and informed patient to call back to confirm or reschedule.

## 2018-06-10 LAB — BASIC METABOLIC PANEL
BUN/Creatinine Ratio: 11 (ref 10–24)
BUN: 12 mg/dL (ref 8–27)
CO2: 21 mmol/L (ref 20–29)
Calcium: 9.5 mg/dL (ref 8.6–10.2)
Chloride: 107 mmol/L — ABNORMAL HIGH (ref 96–106)
Creatinine, Ser: 1.1 mg/dL (ref 0.76–1.27)
GFR calc Af Amer: 82 mL/min/{1.73_m2} (ref >59)
GFR calc non Af Amer: 71 mL/min/{1.73_m2} (ref >59)
Glucose: 88 mg/dL (ref 65–99)
Potassium: 4.5 mmol/L (ref 3.5–5.2)
Sodium: 143 mmol/L (ref 134–144)

## 2018-06-10 LAB — CBC
Hematocrit: 48.2 % (ref 37.5–51.0)
Hemoglobin: 16.4 g/dL (ref 13.0–17.7)
MCH: 31.2 pg (ref 26.6–33.0)
MCHC: 34 g/dL (ref 31.5–35.7)
MCV: 92 fL (ref 79–97)
Platelets: 211 10*3/uL (ref 150–450)
RBC: 5.25 x10E6/uL (ref 4.14–5.80)
RDW: 12.7 % (ref 12.3–15.4)
WBC: 7.8 10*3/uL (ref 3.4–10.8)

## 2018-06-27 ENCOUNTER — Telehealth: Payer: Self-pay | Admitting: Cardiology

## 2018-06-27 DIAGNOSIS — G4733 Obstructive sleep apnea (adult) (pediatric): Secondary | ICD-10-CM | POA: Diagnosis not present

## 2018-06-27 NOTE — Telephone Encounter (Signed)
° °  Patient calling for sleep study results and to determine next step in managing his care under Dr Radford Pax

## 2018-07-03 NOTE — Telephone Encounter (Signed)
Patient has a 10 week follow up appointment scheduled for 08/20/18. Patient set up date was 05/27/18. Patient understands he needs to keep this appointment for insurance compliance. Left detailed message on voicemail with date and time of appt. and informed patient to call back to confirm or reschedule.

## 2018-07-08 DIAGNOSIS — G4733 Obstructive sleep apnea (adult) (pediatric): Secondary | ICD-10-CM | POA: Diagnosis not present

## 2018-07-28 DIAGNOSIS — G4733 Obstructive sleep apnea (adult) (pediatric): Secondary | ICD-10-CM | POA: Diagnosis not present

## 2018-07-29 ENCOUNTER — Encounter: Payer: Self-pay | Admitting: Occupational Therapy

## 2018-07-29 NOTE — Therapy (Signed)
[facility] [address] [place], [place], [address] Phone: [phone]   Fax:  [phone]  Patient Details  Name: Richard [name] MRN: [id number] Date of Birth: 08-24-1955 Referring Provider:  No ref. provider found  Encounter Date: 07/29/2018  OCCUPATIONAL THERAPY DISCHARGE SUMMARY  Visits from Start of Care: 3  Current functional level related to goals / functional outcomes:   OT Long Term Goals - 02/06/18 0803      OT LONG TERM GOAL #1   Title  Pt will be independent with HEP.--check goals 03/06/18    Time  6    Period  Weeks    Status  On-going   02/06/18  met with current, did not return for updates     OT LONG TERM GOAL #2   Title  Pt will improve coordination for ADLs/IADLs and leisure tasks as shown by improving time on 9-hole peg test by at least 20sec.  Updated 02/06/18:  complete in 26sec or less.    Baseline  57.62sec     Time  6    Period  Weeks    Status Met initial goal, did not met updated goal  31.94sec     OT LONG TERM GOAL #3   Title  Pt will be able to write 3 sentences with 100% legibility.    Baseline  75%    Time  6    Period  Weeks    Status  Not met. 02/06/18:  85%     OT LONG TERM GOAL #4   Title  Pt will report ability to perform mod complex cooking tasks mod I.    Time  6    Period  Weeks    Status  Achieved   02/06/18        Remaining deficits: decr RUE coordination   Education / Equipment: Pt instructed in HEP and recommendations for home.  All education not completed due to pt not returning to occupational therapy.  Plan: Patient agrees to discharge.  Patient goals were partially met. Patient is being discharged due to not returning since the last visit.  ?????        [facility] 07/29/2018, 9:01 AM  [facility] [address] [place], [place], [address] Phone: [phone]   Fax:  [place], OTR/L [facility] [address] [place], [place]  [address] [phone] phone [phone] 07/29/18 9:01 AM

## 2018-08-06 DIAGNOSIS — H25013 Cortical age-related cataract, bilateral: Secondary | ICD-10-CM | POA: Diagnosis not present

## 2018-08-06 DIAGNOSIS — H52203 Unspecified astigmatism, bilateral: Secondary | ICD-10-CM | POA: Diagnosis not present

## 2018-08-20 ENCOUNTER — Ambulatory Visit: Payer: Federal, State, Local not specified - PPO | Admitting: Cardiology

## 2018-08-20 NOTE — Progress Notes (Signed)
Cardiology Office Note:    Date:  08/21/2018   ID:  Richard [name], DOB Nov 14, 1955, MRN [id number]  PCP:  Biagio Borg, MD  Cardiologist:  Candee Furbish, MD    Referring MD: Biagio Borg, MD   Chief Complaint  Patient presents with  . Sleep Apnea    History of Present Illness:    Richard [name] is a 63 y.o. male with a hx of ASCAD, PAF and CVA.  He was referred for sleep study which showed mild OSA with an AHI of 7.9/hr overall and moderate during REM sleep at 17.5/hr.  O2 sats dropped to 94.7%.  He underwent CPAP titration to 11cm H2O  He is doing well with his CPAP device and thinks that he has gotten used to it.  He tolerates the mask and feels the pressure is adequate.  Since going on CPAP he feels rested in the am and has no significant daytime sleepiness.  He denies any significant mouth or nasal dryness or nasal congestion.  He does not think that he snores.  He says that he can definitely notice a difference in the frequency of PAF he is having which has significantly decreased since going on CPAP.   Past Medical History:  Diagnosis Date  . Alcoholism in remission ([place]) 08/25/2011  . Allergy    seasonal  . CAD (coronary artery disease) 08/25/2011  . Depression   . Gout   . Hyperlipidemia   . Hypertension   . OSA on CPAP 08/21/2018   Mild OSA at 11/hr now on CPAP at 11cm H2O.   . Sleep apnea    did use cpap about 10 years ago no use now  . Stroke [facility])     Past Surgical History:  Procedure Laterality Date  . COLONOSCOPY  10-22-11   3 polyps  . FINGER NEUROPLASTY     left index finger  . left achillies  2003  . POLYPECTOMY      Current Medications: Current Meds  Medication Sig  . apixaban (ELIQUIS) 5 MG TABS tablet Take 1 tablet (5 mg total) by mouth 2 (two) times daily.  Marland Kitchen atorvastatin (LIPITOR) 80 MG tablet Take 1 tablet (80 mg total) by mouth daily.  Marland Kitchen diltiazem (CARDIZEM CD) 240 MG 24 hr capsule Take 240 mg by mouth daily.  Marland Kitchen ezetimibe (ZETIA) 10 MG tablet Take  1 tablet (10 mg total) by mouth daily.  Marland Kitchen lisinopril (PRINIVIL,ZESTRIL) 5 MG tablet Take 1 tablet (5 mg total) by mouth daily.  . metoprolol succinate (TOPROL-XL) 100 MG 24 hr tablet Take 1 tablet (100 mg total) by mouth daily. Take with or immediately following a meal.  . montelukast (SINGULAIR) 10 MG tablet Take 10 mg by mouth daily as needed (allergies).     Allergies:   Penicillins   Social History   Socioeconomic History  . Marital status: Married    Spouse name: Not on file  . Number of children: 2  . Years of education: Not on file  . Highest education level: Not on file  Occupational History  . Occupation: Teacher, adult education    Comment: [employer]  . Financial resource strain: Not on file  . Food insecurity:    Worry: Not on file    Inability: Not on file  . Transportation needs:    Medical: Not on file    Non-medical: Not on file  Tobacco Use  . Smoking status: Never Smoker  . Smokeless tobacco: Never Used  Substance and Sexual Activity  . Alcohol use: No  . Drug use: No  . Sexual activity: Not on file  Lifestyle  . Physical activity:    Days per week: Not on file    Minutes per session: Not on file  . Stress: Not on file  Relationships  . Social connections:    Talks on phone: Not on file    Gets together: Not on file    Attends religious service: Not on file    Active member of club or organization: Not on file    Attends meetings of clubs or organizations: Not on file    Relationship status: Not on file  Other Topics Concern  . Not on file  Social History Narrative  . Not on file     Family History: The patient's family history includes Diabetes in his mother; Heart attack in his brother; Hypertension in his mother. There is no history of Colon cancer, Stomach cancer, Rectal cancer, or Esophageal cancer.  ROS:   Please see the history of present illness.    ROS  All other systems reviewed and negative.   EKGs/Labs/Other Studies Reviewed:     The following studies were reviewed today: PAP download  EKG:  EKG is not ordered today.    Recent Labs: 01/06/2018: TSH 1.077 02/05/2018: ALT 36 06/09/2018: BUN 12; Creatinine, Ser 1.10; Hemoglobin 16.4; Platelets 211; Potassium 4.5; Sodium 143   Recent Lipid Panel    Component Value Date/Time   CHOL 116 05/08/2018 1346   TRIG 76 05/08/2018 1346   HDL 43 05/08/2018 1346   CHOLHDL 2.7 05/08/2018 1346   CHOLHDL 6.1 01/06/2018 0438   VLDL 17 01/06/2018 0438   LDLCALC 58 05/08/2018 1346   LDLDIRECT 146.2 08/30/2011 1350    Physical Exam:    VS:  BP 132/75   Pulse (!) 59   Ht 6' (1.829 m)   Wt 207 lb 3.2 oz (94 kg)   BMI 28.10 kg/m     Wt Readings from Last 3 Encounters:  08/21/18 207 lb 3.2 oz (94 kg)  06/09/18 203 lb (92.1 kg)  05/08/18 203 lb (92.1 kg)     GEN:  Well nourished, well developed in no acute distress HEENT: Normal NECK: No JVD; No carotid bruits LYMPHATICS: No lymphadenopathy CARDIAC: RRR, no murmurs, rubs, gallops RESPIRATORY:  Clear to auscultation without rales, wheezing or rhonchi  ABDOMEN: Soft, non-tender, non-distended MUSCULOSKELETAL:  No edema; No deformity  SKIN: Warm and dry NEUROLOGIC:  Alert and oriented x 3 PSYCHIATRIC:  Normal affect   ASSESSMENT:    1. OSA on CPAP   2. Essential hypertension   3. Persistent atrial fibrillation    PLAN:    In order of problems listed above:  1.  OSA - the patient is tolerating PAP therapy well without any problems. I will get a download from the DME.The patient has been using and benefiting from PAP use and will continue to benefit from therapy.   2.  HTN - BP is controlled on exam today.  He will continue on Toprol, Cardizem and Lisinopril.  3.  Paroxysmal atrial fibrillation -  He has noticed a significant decreased in episodes of afib since going on PAP therapy.  He will continue on Toprol, Cardizem and apixaban.     Medication Adjustments/Labs and Tests Ordered: Current medicines are  reviewed at length with the patient today.  Concerns regarding medicines are outlined above.  No orders of the defined types were placed in  this encounter.  No orders of the defined types were placed in this encounter.   Signed, Fransico Him, MD  08/21/2018 10:20 PM    [place]

## 2018-08-21 ENCOUNTER — Ambulatory Visit: Payer: Federal, State, Local not specified - PPO | Admitting: Cardiology

## 2018-08-21 ENCOUNTER — Encounter: Payer: Self-pay | Admitting: Cardiology

## 2018-08-21 VITALS — BP 132/75 | HR 59 | Ht 72.0 in | Wt 207.2 lb

## 2018-08-21 DIAGNOSIS — I10 Essential (primary) hypertension: Secondary | ICD-10-CM | POA: Diagnosis not present

## 2018-08-21 DIAGNOSIS — I4819 Other persistent atrial fibrillation: Secondary | ICD-10-CM | POA: Diagnosis not present

## 2018-08-21 DIAGNOSIS — Z9989 Dependence on other enabling machines and devices: Secondary | ICD-10-CM

## 2018-08-21 DIAGNOSIS — G4733 Obstructive sleep apnea (adult) (pediatric): Secondary | ICD-10-CM | POA: Insufficient documentation

## 2018-08-21 HISTORY — DX: Obstructive sleep apnea (adult) (pediatric): G47.33

## 2018-08-21 NOTE — Patient Instructions (Signed)

## 2018-08-26 DIAGNOSIS — G4733 Obstructive sleep apnea (adult) (pediatric): Secondary | ICD-10-CM | POA: Diagnosis not present

## 2018-08-28 ENCOUNTER — Ambulatory Visit (INDEPENDENT_AMBULATORY_CARE_PROVIDER_SITE_OTHER): Payer: Federal, State, Local not specified - PPO | Admitting: Cardiology

## 2018-08-28 ENCOUNTER — Other Ambulatory Visit: Payer: Self-pay

## 2018-08-28 DIAGNOSIS — I10 Essential (primary) hypertension: Secondary | ICD-10-CM

## 2018-08-28 DIAGNOSIS — E7849 Other hyperlipidemia: Secondary | ICD-10-CM | POA: Diagnosis not present

## 2018-08-28 DIAGNOSIS — I259 Chronic ischemic heart disease, unspecified: Secondary | ICD-10-CM

## 2018-08-28 DIAGNOSIS — I4819 Other persistent atrial fibrillation: Secondary | ICD-10-CM

## 2018-08-28 NOTE — Patient Instructions (Signed)
Medication Instructions:  The current medical regimen is effective;  continue present plan and medications.  If you need a refill on your cardiac medications before your next appointment, please call your pharmacy.   Follow-Up: At [facility], you and your health needs are our priority.  As part of our continuing mission to provide you with exceptional heart care, we have created designated Provider Care Teams.  These Care Teams include your primary Cardiologist (physician) and Advanced Practice Providers (APPs -  Physician Assistants and Nurse Practitioners) who all work together to provide you with the care you need, when you need it. You will need a follow up appointment in 12 months.  Please call our office 2 months in advance to schedule this appointment.  You may see Mark Skains, MD or one of the following Advanced Practice Providers on your designated Care Team:   Lori Gerhardt, NP Laura Ingold, NP . Jill McDaniel, NP  Thank you for choosing [facility]!!      

## 2018-08-29 ENCOUNTER — Encounter: Payer: Self-pay | Admitting: Cardiology

## 2018-08-29 NOTE — Progress Notes (Signed)
Cardiology Office Note:    Date:  08/29/2018   ID:  Richard [name], DOB 26-Dec-1955, MRN [id number]  PCP:  Biagio Borg, MD  Cardiologist:  Candee Furbish, MD  Electrophysiologist:  None   Referring MD: Biagio Borg, MD     History of Present Illness:    Richard [name] is a 63 y.o. male seen by Truitt Merle, former patient of Dr. Martinique here for follow-up.  History of hypertension hyperlipidemia sleep apnea with proximal LAD disease on CT scan nonobstructive less than 40% in 2012.  Back in July 2019 went to the ER with right upper extremity weakness and dysarthria and was found to be in atrial fibrillation.  MRI showed a small stroke left parietal region, no hemorrhage.  Cardiology consulted for new weights A. fib.  EF on echocardiogram was 40 to 45%.  Weakness improved.  At a prior visit with Cecille Rubin, there were many questions about what to do going forward.  His heart rate did not correlate with Fitbit.  Dr. Rayann Heman was part of discussion.  Diltiazem was added back and aspirin was stopped.  Previously had no awareness of these atrial fibrillation, no chest pain no shortness of breath, no bleeding.  Gout is gone.  Not using colchicine.  02/28/2018- we had lengthy discussion about his progress and stroke.  He is been doing quite well except for subtle proprioception on the fingertips.  He is not symptomatic with his atrial fibrillation.  He has been in and out.  In fact, he went for a cardioversion but was in normal rhythm at that time.  A couple days later he went for a CT scan and he was in atrial fibrillation and could not have this performed.  We had lengthy discussion about rate versus rhythm control and given that he is asymptomatic, we will continue with rate control strategy.  Denies any fevers chills nausea vomiting syncope bleeding.  08/29/2018  - here for the follow up AFIB  - feeling better. Doing well. Less palpitations.  - denies syncope, cp, sob, n/v.  - meds reviewed and no  side effects  Past Medical History:  Diagnosis Date  . Alcoholism in remission ([place]) 08/25/2011  . Allergy    seasonal  . CAD (coronary artery disease) 08/25/2011  . Depression   . Gout   . Hyperlipidemia   . Hypertension   . OSA on CPAP 08/21/2018   Mild OSA at 11/hr now on CPAP at 11cm H2O.   . Sleep apnea    did use cpap about 10 years ago no use now  . Stroke [facility])     Past Surgical History:  Procedure Laterality Date  . COLONOSCOPY  10-22-11   3 polyps  . FINGER NEUROPLASTY     left index finger  . left achillies  2003  . POLYPECTOMY      Current Medications: No outpatient medications have been marked as taking for the 08/28/18 encounter (Office Visit) with Jerline Pain, MD.     Allergies:   Penicillins   Social History   Socioeconomic History  . Marital status: Married    Spouse name: Not on file  . Number of children: 2  . Years of education: Not on file  . Highest education level: Not on file  Occupational History  . Occupation: Teacher, adult education    Comment: [employer]  . Financial resource strain: Not on file  . Food insecurity:    Worry: Not on file  Inability: Not on file  . Transportation needs:    Medical: Not on file    Non-medical: Not on file  Tobacco Use  . Smoking status: Never Smoker  . Smokeless tobacco: Never Used  Substance and Sexual Activity  . Alcohol use: No  . Drug use: No  . Sexual activity: Not on file  Lifestyle  . Physical activity:    Days per week: Not on file    Minutes per session: Not on file  . Stress: Not on file  Relationships  . Social connections:    Talks on phone: Not on file    Gets together: Not on file    Attends religious service: Not on file    Active member of club or organization: Not on file    Attends meetings of clubs or organizations: Not on file    Relationship status: Not on file  Other Topics Concern  . Not on file  Social History Narrative  . Not on file     Family History: The  patient's family history includes Diabetes in his mother; Heart attack in his brother; Hypertension in his mother. There is no history of Colon cancer, Stomach cancer, Rectal cancer, or Esophageal cancer.  ROS:   Please see the history of present illness.     All other systems reviewed and are negative.  EKGs/Labs/Other Studies Reviewed:    The following studies were reviewed today: Echo 01/06/18: Study Conclusions - Left ventricle: The cavity size was normal. Systolic function was mildly to moderately reduced. The estimated ejection fraction was in the range of 40% to 45%. Moderate diffuse hypokinesis with no identifiable regional variations. - Aortic valve: There was mild regurgitation. - Left atrium: The atrium was mildly dilated. - Right atrium: The atrium was mildly dilated. - Pulmonary arteries: PA peak pressure: 31 mm Hg (S).    EKG: 02/28/2018-atrial fibrillation heart rate 81 bpm no other abnormalities.  Recent Labs: 01/06/2018: TSH 1.077 02/05/2018: ALT 36 06/09/2018: BUN 12; Creatinine, Ser 1.10; Hemoglobin 16.4; Platelets 211; Potassium 4.5; Sodium 143  Recent Lipid Panel    Component Value Date/Time   CHOL 116 05/08/2018 1346   TRIG 76 05/08/2018 1346   HDL 43 05/08/2018 1346   CHOLHDL 2.7 05/08/2018 1346   CHOLHDL 6.1 01/06/2018 0438   VLDL 17 01/06/2018 0438   LDLCALC 58 05/08/2018 1346   LDLDIRECT 146.2 08/30/2011 1350    Physical Exam:    VS:  There were no vitals taken for this visit.   110/72, pulse 71, o2 sat 95%, 208lbs  Wt Readings from Last 3 Encounters:  08/21/18 207 lb 3.2 oz (94 kg)  06/09/18 203 lb (92.1 kg)  05/08/18 203 lb (92.1 kg)     GEN: Well nourished, well developed, in no acute distress  HEENT: normal  Neck: no JVD, carotid bruits, or masses Cardiac: RRR; no murmurs, rubs, or gallops,no edema  Respiratory:  clear to auscultation bilaterally, normal work of breathing GI: soft, nontender, nondistended, + BS MS: no  deformity or atrophy  Skin: warm and dry, no rash Neuro:  Alert and Oriented x 3, Strength and sensation are intact Psych: euthymic mood, full affect   ASSESSMENT:    1. Persistent atrial fibrillation   2. Other hyperlipidemia   3. Ischemic heart disease   4. Essential hypertension    PLAN:    In order of problems listed above:  Atrial fibrillation with rapid ventricular response previously, persistent - Cardioversion discussed, Truitt Merle.  When he  presented for his cardioversion, he was actually back in normal rhythm so this was not performed.  However when he went for his CT scan several days later, he was back in atrial fibrillation.  Because he is asymptomatic with his atrial fibrillation, we will continue with rate control strategy.  We discussed affirm trial at length.  No changes made.  Good rate control.   - no changes in meds, decreased sensation of afib.   Coronary artery disease -Unable to perform secondary to atrial fibrillation CT scan of coronary arteries.  At this point, he is not having any anginal symptoms.  I am comfortable with continuing current medical management.  Exercise.  Repeating echocardiogram in the future would be reasonable.  Continue with statin therapy. No changes  Stroke - Has made a remarkable recovery. Feels well. No changes.    Medication Adjustments/Labs and Tests Ordered: Current medicines are reviewed at length with the patient today.  Concerns regarding medicines are outlined above.  No orders of the defined types were placed in this encounter.  No orders of the defined types were placed in this encounter.   Patient Instructions  Medication Instructions:  The current medical regimen is effective;  continue present plan and medications.  If you need a refill on your cardiac medications before your next appointment, please call your pharmacy.   Follow-Up: At [facility], you and your health needs are our priority.  As part of our  continuing mission to provide you with exceptional heart care, we have created designated Provider Care Teams.  These Care Teams include your primary Cardiologist (physician) and Advanced Practice Providers (APPs -  Physician Assistants and Nurse Practitioners) who all work together to provide you with the care you need, when you need it. You will need a follow up appointment in 12 months.  Please call our office 2 months in advance to schedule this appointment.  You may see Candee Furbish, MD or one of the following Advanced Practice Providers on your designated Care Team:   Truitt Merle, NP Cecilie Kicks, NP . Kathyrn Drown, NP  Thank you for choosing [facility]!!         Signed, Candee Furbish, MD  08/29/2018 11:24 AM    [place]

## 2018-09-26 DIAGNOSIS — G4733 Obstructive sleep apnea (adult) (pediatric): Secondary | ICD-10-CM | POA: Diagnosis not present

## 2018-10-26 DIAGNOSIS — G4733 Obstructive sleep apnea (adult) (pediatric): Secondary | ICD-10-CM | POA: Diagnosis not present

## 2018-11-06 ENCOUNTER — Ambulatory Visit: Payer: Federal, State, Local not specified - PPO | Admitting: Adult Health

## 2018-11-07 ENCOUNTER — Ambulatory Visit: Payer: Federal, State, Local not specified - PPO | Admitting: Internal Medicine

## 2018-11-26 DIAGNOSIS — G4733 Obstructive sleep apnea (adult) (pediatric): Secondary | ICD-10-CM | POA: Diagnosis not present

## 2018-12-10 ENCOUNTER — Telehealth: Payer: Self-pay

## 2018-12-10 NOTE — Telephone Encounter (Signed)
Unable to reach pt with appt being change to mychart video due to covid 19. Could not leave message vm not set up.

## 2018-12-11 ENCOUNTER — Encounter: Payer: Federal, State, Local not specified - PPO | Admitting: Internal Medicine

## 2018-12-11 NOTE — Telephone Encounter (Signed)
I called pt that his appt was change to mychart video visit due to COVID 19. Pt gave verbal consent to do video and to file insurance. He already has a mychart account, and a device with a camera on it. I updated pts meds, allergies,and pcp. Pt verbalized understanding and knows to log onto account 10 minutes early.

## 2018-12-15 ENCOUNTER — Encounter: Payer: Self-pay | Admitting: Adult Health

## 2018-12-15 ENCOUNTER — Telehealth (INDEPENDENT_AMBULATORY_CARE_PROVIDER_SITE_OTHER): Payer: Federal, State, Local not specified - PPO | Admitting: Adult Health

## 2018-12-15 VITALS — BP 116/68 | Ht 72.0 in | Wt 204.0 lb

## 2018-12-15 DIAGNOSIS — I480 Paroxysmal atrial fibrillation: Secondary | ICD-10-CM

## 2018-12-15 DIAGNOSIS — I10 Essential (primary) hypertension: Secondary | ICD-10-CM

## 2018-12-15 DIAGNOSIS — I639 Cerebral infarction, unspecified: Secondary | ICD-10-CM

## 2018-12-15 DIAGNOSIS — E7800 Pure hypercholesterolemia, unspecified: Secondary | ICD-10-CM

## 2018-12-15 NOTE — Progress Notes (Signed)
[facility] [address] [place]. [place] [address] [phone]       FOLLOW UP NOTE  [name] Date of Birth:  1956/05/02 Medical Record Number:  [id number]   Reason for visit: Stroke follow up  Virtual Visit via Video Note  I connected with Richard [name] on 12/15/18 at 12:45 PM EDT by a video enabled telemedicine application located at [facility] and verified that I am speaking with the correct person using two identifiers who was located at their own home.   I discussed the limitations of evaluation and management by telemedicine and the availability of in person appointments. The patient expressed understanding and agreed to proceed.   HPI: Hospital admission 01/05/2018: Richard [name] is a 63 y.o. male with history of ETOH abuse in remission x 12 yrs, HTN, HLD, OSA who presented with right-sided weakness, right facial droop and global aphasia.  During transport to ED, EMS found atrial fibrillation on EKG and patient denies prior history or diagnosis of AF.  AF further confirmed on EKG performed at [facility] ED.  Patient was not on antithrombotic PTA.  CT had reviewed and was negative for acute stroke.  CTA head and neck was negative for significant stenosis.  CT perfusion was negative.  MRI head reviewed and showed patchy small left parietal temporal cortical infarcts with associated petechial hemorrhage.  EEG negative for seizure activity.  2D echo showed an EF of 40 to 45%.  LDL 192 and recommended continuation of Lipitor 80 mg along with addition of Zetia.  HTN stable during admission and recommended long-term BP goal.  A1c satisfactory at 5.5.  Patient does have history of OSA not on CPAP.  Patient was discharged home without therapy needs. Patient was referred to cardiology for follow-up on 01/20/2018.  Referral for sleep study was placed.  Patient was discharged on aspirin 325 along with full anticoagulation with Eliquis and is  recommended to stop aspirin as there is no indication for continuation.  02/05/2018 visit: Patient is being seen today for hospital stroke follow-up and is accompanied by his wife.  He continues to do well since hospital discharge with only mild residual deficit of right hand weakness but otherwise states all of the weakness resolved along with dysarthria.  He continues to participate in OT at our neuro rehab clinic.  He has completed PT and speech therapy.  He continues to take Eliquis without side effects of bleeding or bruising and continues to follow-up with cardiologist along with undergoing scheduled cardioversion on 02/11/2018.  Continues to take Lipitor along with Zetia without complaints of myalgias.  Recommended PCP obtain lipid panel in 1 month to ensure decrease in LDL and if LDL not decreased, recommend consideration of starting Repatha.  Patient states that he was not consistently taking Lipitor 80 mg prior to his stroke but has been compliant with all prescribed medications since his stroke.  He also has decrease his sodium intake along with avoiding red meats and increasing intake of fish and occasionally chicken.  Blood pressure today satisfactory 114/69.  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  Denies new or worsening stroke/TIA symptoms.  05/08/2018 visit: Patient is being seen today for 21-month follow-up visit and is accompanied by his wife. Patient has been doing well. Has completed OT with improvement in hand writing. He denies any additional weakness. Continues to take eliquis without bleeding or bruising. Continues on lipitor and Zetia without side  effects of myalgias. Blood pressure satisfactory at 119/78. He does continue to monitor at home and this is a typical level for him. He doe s have hx of OSA and states compliance with CPAP. He has returned back to all his prior activities including wood working. Denies new or worsening stroke/TIA  symptoms.   12/15/2018 VIRTUAL VISIT: Richard [name] is a 63 year old male who is being seen today for follow-up regarding left parietal temporal cortical infarct in 12/2017.  He was initially scheduled today for face-to-face office visit but due to COVID-19 safety precautions, visit transition to telemedicine via MyChart with patient's consent.   Denies residual deficits - continues to do daily activities working with wood and Futures trader on Eliquis for secondary stroke prevention and atrial fibrillation without bleeding or bruising Continues on atorvastatin without side effects myalgias - plans on getting labs at follow up with Dr. Jenny Reichmann in 12/22/2018 Blood pressure stable with todays reading 116/68 Ongoing compliance with CPAP for OSA     ROS:   14 system review of systems performed and negative with exception of no complaints  PMH:  Past Medical History:  Diagnosis Date  . Alcoholism in remission ([place]) 08/25/2011  . Allergy    seasonal  . CAD (coronary artery disease) 08/25/2011  . Depression   . Gout   . Hyperlipidemia   . Hypertension   . OSA on CPAP 08/21/2018   Mild OSA at 11/hr now on CPAP at 11cm H2O.   . Sleep apnea    did use cpap about 10 years ago no use now  . Stroke [facility])     PSH:  Past Surgical History:  Procedure Laterality Date  . COLONOSCOPY  10-22-11   3 polyps  . FINGER NEUROPLASTY     left index finger  . left achillies  2003  . POLYPECTOMY      Social History:  Social History   Socioeconomic History  . Marital status: Married    Spouse name: Not on file  . Number of children: 2  . Years of education: Not on file  . Highest education level: Not on file  Occupational History  . Occupation: Teacher, adult education    Comment: [employer]  . Financial resource strain: Not on file  . Food insecurity    Worry: Not on file    Inability: Not on file  . Transportation needs    Medical: Not on file    Non-medical: Not on file  Tobacco Use  .  Smoking status: Never Smoker  . Smokeless tobacco: Never Used  Substance and Sexual Activity  . Alcohol use: No  . Drug use: No  . Sexual activity: Not on file  Lifestyle  . Physical activity    Days per week: Not on file    Minutes per session: Not on file  . Stress: Not on file  Relationships  . Social Herbalist on phone: Not on file    Gets together: Not on file    Attends religious service: Not on file    Active member of club or organization: Not on file    Attends meetings of clubs or organizations: Not on file    Relationship status: Not on file  . Intimate partner violence    Fear of current or ex partner: Not on file    Emotionally abused: Not on file    Physically abused: Not on file    Forced sexual activity: Not on  file  Other Topics Concern  . Not on file  Social History Narrative  . Not on file    Family History:  Family History  Problem Relation Age of Onset  . Hypertension Mother   . Diabetes Mother   . Heart attack Brother   . Colon cancer Neg Hx   . Stomach cancer Neg Hx   . Rectal cancer Neg Hx   . Esophageal cancer Neg Hx     Medications:   Current Outpatient Medications on File Prior to Visit  Medication Sig Dispense Refill  . apixaban (ELIQUIS) 5 MG TABS tablet Take 1 tablet (5 mg total) by mouth 2 (two) times daily. 180 tablet 3  . atorvastatin (LIPITOR) 80 MG tablet Take 1 tablet (80 mg total) by mouth daily. 90 tablet 3  . diltiazem (CARDIZEM CD) 240 MG 24 hr capsule Take 240 mg by mouth daily.  3  . ezetimibe (ZETIA) 10 MG tablet Take 1 tablet (10 mg total) by mouth daily. 90 tablet 3  . lisinopril (PRINIVIL,ZESTRIL) 5 MG tablet Take 1 tablet (5 mg total) by mouth daily. 90 tablet 3  . metoprolol succinate (TOPROL-XL) 100 MG 24 hr tablet Take 1 tablet (100 mg total) by mouth daily. Take with or immediately following a meal. 90 tablet 3  . montelukast (SINGULAIR) 10 MG tablet Take 10 mg by mouth daily as needed (allergies).      No current facility-administered medications on file prior to visit.     Allergies:   Allergies  Allergen Reactions  . Penicillins Other (See Comments)    Siblings are allergic Has patient had a PCN reaction causing immediate rash, facial/tongue/throat swelling, SOB or lightheadedness with hypotension: Unknown Has patient had a PCN reaction causing severe rash involving mucus membranes or skin necrosis: Unknown Has patient had a PCN reaction that required hospitalization: Unknown Has patient had a PCN reaction occurring within the last 10 years: Unknown If all of the above answers are "NO", then may proceed with Cephalosporin use. Told to avoid by parents     Physical Exam  Vitals:   12/15/18 1258  BP: 116/68  Weight: 204 lb (92.5 kg)  Height: 6' (1.829 m)   Body mass index is 27.67 kg/m. No exam data present  General: well developed, well nourished, pleasant middle aged , seated, in no evident distress Head: head normocephalic and atraumatic.    Neurologic Exam Mental Status: Awake and fully alert. Oriented to place and time. Recent and remote memory intact. Attention span, concentration and fund of knowledge appropriate. Mood and affect appropriate.  Cranial Nerves: Extraocular movements full without nystagmus. Hearing intact to voice. Facial sensation intact. Face, tongue, palate moves normally and symmetrically.  Shoulder shrug symmetric. Motor: No evidence of weakness per drift assessment Sensory.: intact to light touch Coordination: Rapid alternating movements normal in all extremities. Finger-to-nose and heel-to-shin performed accurately bilaterally. Gait and Station: Arises from chair without difficulty. Stance is normal. Gait demonstrates normal stride length and balance .  Reflexes: UTA    Diagnostic Data (Labs, Imaging, Testing)  CT HEAD WO CONTRAST 01/05/2018 IMPRESSION: 1. Negative exam 2. ASPECTS is 10.  MR BRAIN WO CONTRAST 01/05/2018 IMPRESSION: 1.  Patchy small volume acute ischemic cortical infarcts involving the left parietotemporal region as above. Scant associated petechial hemorrhage without hemorrhagic transformation or mass effect. 2. Otherwise normal brain MRI.  CT ANGIO HEAD W OR WO CONTRAST CT ANGIO NECK W OR WO CONTRAST 01/05/2018 IMPRESSION: No intracranial or extracranial stenosis or  occlusion. Mild irregularity of the distal MCA branches suggesting intracranial atherosclerotic change.   ECHOCARDIOGRAM 01/06/2018 Study Conclusions  - Left ventricle: The cavity size was normal. Systolic function was   mildly to moderately reduced. The estimated ejection fraction was   in the range of 40% to 45%. Moderate diffuse hypokinesis with no   identifiable regional variations. - Aortic valve: There was mild regurgitation. - Left atrium: The atrium was mildly dilated. - Right atrium: The atrium was mildly dilated. - Pulmonary arteries: PA peak pressure: 31 mm Hg (S   ASSESSMENT: Richard [name] is a 63 y.o. year old male here with left parietal temporal region infarct on 01/05/2018 secondary to new onset AF not on AC. Vascular risk factors include new onset AF, HLD, HTN and OSA.  No residual deficits.    PLAN: -Continue Eliquis (apixaban) daily  and Lipitor and Zetia for secondary stroke prevention -F/u with PCP regarding your HTN and HTN management -f/u with cardiologist for continued management of AF and AC  -continue to monitor BP at home -advised to continue to stay active and maintain a healthy diet -Maintain strict control of hypertension with blood pressure goal below 130/90, diabetes with hemoglobin A1c goal below 6.5% and cholesterol with LDL cholesterol (bad cholesterol) goal below 70 mg/dL. I also advised the patient to eat a healthy diet with plenty of whole grains, cereals, fruits and vegetables, exercise regularly and maintain ideal body weight.  Stable from stroke standpoint recommend follow-up as needed    Greater than 50% of time during this 15 minute non-face-to-face visit was spent on counseling,explanation of diagnosis of left parietal temporal region infarct, reviewing risk factor management of AF, HLD, HTN and OSA, planning of further management, discussion with patient and family and coordination of care    Venancio Poisson, AGNP-BC  [facility] [address] [place], [place] [address]  Phone [phone] Fax [phone] Note: This document was prepared with digital dictation and possible smart phrase technology. Any transcriptional errors that result from this process are unintentional.

## 2018-12-16 NOTE — Progress Notes (Signed)
I agree with the above plan 

## 2018-12-22 ENCOUNTER — Other Ambulatory Visit (INDEPENDENT_AMBULATORY_CARE_PROVIDER_SITE_OTHER): Payer: Federal, State, Local not specified - PPO

## 2018-12-22 ENCOUNTER — Ambulatory Visit (INDEPENDENT_AMBULATORY_CARE_PROVIDER_SITE_OTHER): Payer: Federal, State, Local not specified - PPO | Admitting: Internal Medicine

## 2018-12-22 ENCOUNTER — Other Ambulatory Visit: Payer: Self-pay

## 2018-12-22 ENCOUNTER — Encounter: Payer: Self-pay | Admitting: Internal Medicine

## 2018-12-22 VITALS — BP 112/76 | HR 64 | Temp 98.1°F | Ht 72.0 in | Wt 209.0 lb

## 2018-12-22 DIAGNOSIS — Z0000 Encounter for general adult medical examination without abnormal findings: Secondary | ICD-10-CM

## 2018-12-22 DIAGNOSIS — E611 Iron deficiency: Secondary | ICD-10-CM

## 2018-12-22 DIAGNOSIS — R739 Hyperglycemia, unspecified: Secondary | ICD-10-CM

## 2018-12-22 DIAGNOSIS — E538 Deficiency of other specified B group vitamins: Secondary | ICD-10-CM

## 2018-12-22 DIAGNOSIS — M25511 Pain in right shoulder: Secondary | ICD-10-CM

## 2018-12-22 DIAGNOSIS — E559 Vitamin D deficiency, unspecified: Secondary | ICD-10-CM | POA: Diagnosis not present

## 2018-12-22 LAB — LIPID PANEL
Cholesterol: 109 mg/dL (ref 0–200)
HDL: 45.4 mg/dL (ref >39.00)
LDL Cholesterol: 51 mg/dL (ref 0–99)
NonHDL: 63.84
Total CHOL/HDL Ratio: 2
Triglycerides: 64 mg/dL (ref 0.0–149.0)
VLDL: 12.8 mg/dL (ref 0.0–40.0)

## 2018-12-22 LAB — CBC WITH DIFFERENTIAL/PLATELET
Basophils Absolute: 0.1 10*3/uL (ref 0.0–0.1)
Basophils Relative: 0.7 % (ref 0.0–3.0)
Eosinophils Absolute: 0.1 10*3/uL (ref 0.0–0.7)
Eosinophils Relative: 1.3 % (ref 0.0–5.0)
HCT: 48.5 % (ref 39.0–52.0)
Hemoglobin: 16.4 g/dL (ref 13.0–17.0)
Lymphocytes Relative: 26.4 % (ref 12.0–46.0)
Lymphs Abs: 2.1 10*3/uL (ref 0.7–4.0)
MCHC: 33.8 g/dL (ref 30.0–36.0)
MCV: 93.3 fl (ref 78.0–100.0)
Monocytes Absolute: 0.8 10*3/uL (ref 0.1–1.0)
Monocytes Relative: 10.1 % (ref 3.0–12.0)
Neutro Abs: 4.9 10*3/uL (ref 1.4–7.7)
Neutrophils Relative %: 61.5 % (ref 43.0–77.0)
Platelets: 187 10*3/uL (ref 150.0–400.0)
RBC: 5.2 Mil/uL (ref 4.22–5.81)
RDW: 13.3 % (ref 11.5–15.5)
WBC: 8 10*3/uL (ref 4.0–10.5)

## 2018-12-22 LAB — BASIC METABOLIC PANEL
BUN: 13 mg/dL (ref 6–23)
CO2: 22 mEq/L (ref 19–32)
Calcium: 9.1 mg/dL (ref 8.4–10.5)
Chloride: 107 mEq/L (ref 96–112)
Creatinine, Ser: 1.04 mg/dL (ref 0.40–1.50)
GFR: 71.79 mL/min (ref >60.00)
Glucose, Bld: 86 mg/dL (ref 70–99)
Potassium: 4 mEq/L (ref 3.5–5.1)
Sodium: 139 mEq/L (ref 135–145)

## 2018-12-22 LAB — URINALYSIS, ROUTINE W REFLEX MICROSCOPIC
Bilirubin Urine: NEGATIVE
Hgb urine dipstick: NEGATIVE
Ketones, ur: NEGATIVE
Leukocytes,Ua: NEGATIVE
Nitrite: NEGATIVE
RBC / HPF: NONE SEEN (ref 0–?)
Specific Gravity, Urine: <=1.005 — AB (ref 1.000–1.030)
Total Protein, Urine: NEGATIVE
Urine Glucose: NEGATIVE
Urobilinogen, UA: 0.2 (ref 0.0–1.0)
pH: 5.5 (ref 5.0–8.0)

## 2018-12-22 LAB — TSH: TSH: 1.73 u[IU]/mL (ref 0.35–4.50)

## 2018-12-22 LAB — HEPATIC FUNCTION PANEL
ALT: 37 U/L (ref 0–53)
AST: 25 U/L (ref 0–37)
Albumin: 4.6 g/dL (ref 3.5–5.2)
Alkaline Phosphatase: 84 U/L (ref 39–117)
Bilirubin, Direct: 0.1 mg/dL (ref 0.0–0.3)
Total Bilirubin: 0.9 mg/dL (ref 0.2–1.2)
Total Protein: 7.2 g/dL (ref 6.0–8.3)

## 2018-12-22 LAB — IBC PANEL
Iron: 127 ug/dL (ref 42–165)
Saturation Ratios: 37.3 % (ref 20.0–50.0)
Transferrin: 243 mg/dL (ref 212.0–360.0)

## 2018-12-22 LAB — HEMOGLOBIN A1C: Hgb A1c MFr Bld: 5.8 % (ref 4.6–6.5)

## 2018-12-22 LAB — VITAMIN D 25 HYDROXY (VIT D DEFICIENCY, FRACTURES): VITD: 18.92 ng/mL — ABNORMAL LOW (ref 30.00–100.00)

## 2018-12-22 LAB — VITAMIN B12: Vitamin B-12: 206 pg/mL — ABNORMAL LOW (ref 211–911)

## 2018-12-22 MED ORDER — ZOSTER VAC RECOMB ADJUVANTED 50 MCG/0.5ML IM SUSR: 0.5000 mL | Freq: Once | INTRAMUSCULAR | 1 refills | Status: AC

## 2018-12-22 NOTE — Patient Instructions (Addendum)
Your shingles shot prescription was sent to [facility]  You will be contacted regarding the referral for: Dr Tamala Julian for the right shoulder  Please continue all other medications as before, and refills have been done if requested.  Please have the pharmacy call with any other refills you may need.  Please continue your efforts at being more active, low cholesterol diet, and weight control.  You are otherwise up to date with prevention measures today.  Please keep your appointments with your specialists as you may have planned  Please go to the LAB in the Basement (turn left off the elevator) for the tests to be done today  You will be contacted by phone if any changes need to be made immediately.  Otherwise, you will receive a letter about your results with an explanation, but please check with MyChart first.  Please remember to sign up for MyChart if you have not done so, as this will be important to you in the future with finding out test results, communicating by private email, and scheduling acute appointments online when needed.  Please return in 6 months, or sooner if needed, with Lab testing done 3-5 days before

## 2018-12-22 NOTE — Progress Notes (Signed)
Subjective:    Patient ID: Richard [name], male    DOB: 01-26-1956, 63 y.o.   MRN: [id number]  HPI  Here for wellness and f/u;  Overall doing ok;  Pt denies Chest pain, worsening SOB, DOE, wheezing, orthopnea, PND, worsening LE edema, palpitations, dizziness or syncope.  Pt denies neurological change such as new headache, facial or extremity weakness.  Pt denies polydipsia, polyuria, or low sugar symptoms. Pt states overall good compliance with treatment and medications, good tolerability, and has been trying to follow appropriate diet.  Pt denies worsening depressive symptoms, suicidal ideation or panic. No fever, night sweats, wt loss, loss of appetite, or other constitutional symptoms.  Pt states good ability with ADL's, has low fall risk, home safety reviewed and adequate, no other significant changes in hearing or vision, and only occasionally active with exercise. Good compliance with CPAP, tends to help the afib.   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  Also has right shoulder  Pain mild intermittent after playing ball with grandkids, worse to abduct or forward elevate, nothing else makes better or worse Past Medical History:  Diagnosis Date  . Alcoholism in remission ([place]) 08/25/2011  . Allergy    seasonal  . CAD (coronary artery disease) 08/25/2011  . Depression   . Gout   . Hyperlipidemia   . Hypertension   . OSA on CPAP 08/21/2018   Mild OSA at 11/hr now on CPAP at 11cm H2O.   . Sleep apnea    did use cpap about 10 years ago no use now  . Stroke [facility])    Past Surgical History:  Procedure Laterality Date  . COLONOSCOPY  10-22-11   3 polyps  . FINGER NEUROPLASTY     left index finger  . left achillies  2003  . POLYPECTOMY      reports that he has never smoked. He has never used smokeless tobacco. He reports that he does not drink alcohol or use drugs. family history includes Diabetes in his mother; Heart attack in his brother; Hypertension in his mother. Allergies   Allergen Reactions  . Penicillins Other (See Comments)    Siblings are allergic Has patient had a PCN reaction causing immediate rash, facial/tongue/throat swelling, SOB or lightheadedness with hypotension: Unknown Has patient had a PCN reaction causing severe rash involving mucus membranes or skin necrosis: Unknown Has patient had a PCN reaction that required hospitalization: Unknown Has patient had a PCN reaction occurring within the last 10 years: Unknown If all of the above answers are "NO", then may proceed with Cephalosporin use. Told to avoid by parents   Current Outpatient Medications on File Prior to Visit  Medication Sig Dispense Refill  . apixaban (ELIQUIS) 5 MG TABS tablet Take 1 tablet (5 mg total) by mouth 2 (two) times daily. 180 tablet 3  . atorvastatin (LIPITOR) 80 MG tablet Take 1 tablet (80 mg total) by mouth daily. 90 tablet 3  . diltiazem (CARDIZEM CD) 240 MG 24 hr capsule Take 240 mg by mouth daily.  3  . ezetimibe (ZETIA) 10 MG tablet Take 1 tablet (10 mg total) by mouth daily. 90 tablet 3  . lisinopril (PRINIVIL,ZESTRIL) 5 MG tablet Take 1 tablet (5 mg total) by mouth daily. 90 tablet 3  . metoprolol succinate (TOPROL-XL) 100 MG 24 hr tablet Take 1 tablet (100 mg total) by mouth daily. Take with or immediately following a meal. 90 tablet 3   No current facility-administered medications  on file prior to visit.    Review of Systems Constitutional: Negative for other unusual diaphoresis, sweats, appetite or weight changes HENT: Negative for other worsening hearing loss, ear pain, facial swelling, mouth sores or neck stiffness.   Eyes: Negative for other worsening pain, redness or other visual disturbance.  Respiratory: Negative for other stridor or swelling Cardiovascular: Negative for other palpitations or other chest pain  Gastrointestinal: Negative for worsening diarrhea or loose stools, blood in stool, distention or other pain Genitourinary: Negative for  hematuria, flank pain or other change in urine volume.  Musculoskeletal: Negative for myalgias or other joint swelling.  Skin: Negative for other color change, or other wound or worsening drainage.  Neurological: Negative for other syncope or numbness. Hematological: Negative for other adenopathy or swelling Psychiatric/Behavioral: Negative for hallucinations, other worsening agitation, SI, self-injury, or new decreased concentration All other system neg per pt    Objective:   Physical Exam BP 112/76   Pulse 64   Temp 98.1 F (36.7 C) (Oral)   Ht 6' (1.829 m)   Wt 209 lb (94.8 kg)   SpO2 97%   BMI 28.35 kg/m  VS noted,  Constitutional: Pt is oriented to person, place, and time. Appears well-developed and well-nourished, in no significant distress and comfortable Head: Normocephalic and atraumatic  Eyes: Conjunctivae and EOM are normal. Pupils are equal, round, and reactive to light Right Ear: External ear normal without discharge Left Ear: External ear normal without discharge Nose: Nose without discharge or deformity Mouth/Throat: Oropharynx is without other ulcerations and moist  Neck: Normal range of motion. Neck supple. No JVD present. No tracheal deviation present or significant neck LA or mass Cardiovascular: Normal rate, regular rhythm, normal heart sounds and intact distal pulses.   Pulmonary/Chest: WOB normal and breath sounds without rales or wheezing  Abdominal: Soft. Bowel sounds are normal. NT. No HSM  Musculoskeletal: Normal range of motion. Exhibits no edema Lymphadenopathy: Has no other cervical adenopathy.  Neurological: Pt is alert and oriented to person, place, and time. Pt has normal reflexes. No cranial nerve deficit. Motor grossly intact, Gait intact Skin: Skin is warm and dry. No rash noted or new ulcerations Psychiatric:  Has normal mood and affect. Behavior is normal without agitation No other exam findings Lab Results  Component Value Date   WBC 8.0  12/22/2018   HGB 16.4 12/22/2018   HCT 48.5 12/22/2018   PLT 187.0 12/22/2018   GLUCOSE 86 12/22/2018   CHOL 109 12/22/2018   TRIG 64.0 12/22/2018   HDL 45.40 12/22/2018   LDLDIRECT 146.2 08/30/2011   LDLCALC 51 12/22/2018   ALT 37 12/22/2018   AST 25 12/22/2018   NA 139 12/22/2018   K 4.0 12/22/2018   CL 107 12/22/2018   CREATININE 1.04 12/22/2018   BUN 13 12/22/2018   CO2 22 12/22/2018   TSH 1.73 12/22/2018   PSA 1.90 11/01/2017   INR 0.98 01/05/2018   HGBA1C 5.8 12/22/2018      Assessment & Plan:

## 2018-12-23 ENCOUNTER — Other Ambulatory Visit: Payer: Self-pay | Admitting: Internal Medicine

## 2018-12-23 ENCOUNTER — Telehealth: Payer: Self-pay

## 2018-12-23 MED ORDER — VITAMIN D (ERGOCALCIFEROL) 1.25 MG (50000 UNIT) PO CAPS: 50000.0000 [IU] | ORAL_CAPSULE | ORAL | 0 refills | Status: DC

## 2018-12-23 NOTE — Telephone Encounter (Signed)
Pt has viewed results via MyChart  

## 2018-12-23 NOTE — Telephone Encounter (Signed)
-----   Message from Biagio Borg, MD sent at 12/23/2018  6:41 AM EDT ----- Left message on MyChart, pt to cont same tx except  The test results show that your current treatment is OK, except the Vitamin D and Vitamin B12 levels are low  We need to: 1)  Please take Vitamin D 50000 units weekly for 12 weeks, then plan to change to OTC Vitamin D3 at 2000 units per day, indefinitely. 2)  Start monthly B12 injections for 6 months, then change to OTC B12 supplement 1 per day    Betzabeth Derringer to please inform pt, I will do rx x 1, and to start B12 shots

## 2018-12-24 ENCOUNTER — Ambulatory Visit (INDEPENDENT_AMBULATORY_CARE_PROVIDER_SITE_OTHER): Payer: Federal, State, Local not specified - PPO

## 2018-12-24 DIAGNOSIS — E538 Deficiency of other specified B group vitamins: Secondary | ICD-10-CM

## 2018-12-24 MED ORDER — CYANOCOBALAMIN 1000 MCG/ML IJ SOLN
1000.0000 ug | Freq: Once | INTRAMUSCULAR | Status: AC
  Administered 2018-12-24: 1000 ug via INTRAMUSCULAR

## 2018-12-24 NOTE — Progress Notes (Signed)
Medical screening examination/treatment/procedure(s) were performed by non-physician practitioner and as supervising physician I was immediately available for consultation/collaboration. I agree with above. James John, MD   

## 2018-12-26 DIAGNOSIS — G4733 Obstructive sleep apnea (adult) (pediatric): Secondary | ICD-10-CM | POA: Diagnosis not present

## 2018-12-27 ENCOUNTER — Encounter: Payer: Self-pay | Admitting: Internal Medicine

## 2018-12-27 NOTE — Assessment & Plan Note (Signed)
Mild,? rotater cuff dz, for sport med referral

## 2018-12-27 NOTE — Assessment & Plan Note (Signed)

## 2019-01-19 ENCOUNTER — Other Ambulatory Visit: Payer: Self-pay | Admitting: Cardiology

## 2019-01-19 MED ORDER — METOPROLOL SUCCINATE ER 100 MG PO TB24: 100.0000 mg | ORAL_TABLET | Freq: Every day | ORAL | 2 refills | Status: DC

## 2019-01-19 MED ORDER — DILTIAZEM HCL ER COATED BEADS 240 MG PO CP24: 240.0000 mg | ORAL_CAPSULE | Freq: Every day | ORAL | 2 refills | Status: DC

## 2019-01-19 MED ORDER — ATORVASTATIN CALCIUM 80 MG PO TABS: 80.0000 mg | ORAL_TABLET | Freq: Every day | ORAL | 2 refills | Status: DC

## 2019-01-19 MED ORDER — APIXABAN 5 MG PO TABS: 5.0000 mg | ORAL_TABLET | Freq: Two times a day (BID) | ORAL | 1 refills | Status: DC

## 2019-01-19 MED ORDER — LISINOPRIL 5 MG PO TABS: 5.0000 mg | ORAL_TABLET | Freq: Every day | ORAL | 2 refills | Status: DC

## 2019-01-19 MED ORDER — EZETIMIBE 10 MG PO TABS: 10.0000 mg | ORAL_TABLET | Freq: Every day | ORAL | 2 refills | Status: DC

## 2019-01-19 NOTE — Telephone Encounter (Signed)
Pt calling requesting a 90 day supply of Eliquis. Please address

## 2019-01-19 NOTE — Telephone Encounter (Signed)
Pt last saw Dr Marlou Porch 08/28/18, last labs 12/22/18 Creat 1.04, age 63, weight 94.8kg, based on specified criteria pt is on appropriate dosage of Eliquis 5mg  BID.  Will refill rx.

## 2019-01-23 ENCOUNTER — Ambulatory Visit (INDEPENDENT_AMBULATORY_CARE_PROVIDER_SITE_OTHER): Payer: Federal, State, Local not specified - PPO

## 2019-01-23 DIAGNOSIS — E538 Deficiency of other specified B group vitamins: Secondary | ICD-10-CM

## 2019-01-23 MED ORDER — CYANOCOBALAMIN 1000 MCG/ML IJ SOLN
1000.0000 ug | Freq: Once | INTRAMUSCULAR | Status: AC
  Administered 2019-01-23: 1000 ug via INTRAMUSCULAR

## 2019-01-23 NOTE — Progress Notes (Signed)
Medical screening examination/treatment/procedure(s) were performed by non-physician practitioner and as supervising physician I was immediately available for consultation/collaboration. I agree with above. James John, MD   

## 2019-01-26 DIAGNOSIS — G4733 Obstructive sleep apnea (adult) (pediatric): Secondary | ICD-10-CM | POA: Diagnosis not present

## 2019-01-31 NOTE — Progress Notes (Signed)
[name] [address] [place], [place] [address] Phone: [phone] Subjective:   I Richard [name] am serving as a Education administrator for Dr. Hulan Saas.  I'm seeing this patient by the request  of:  Biagio Borg, MD    CC: Right shoulder pain  PPI:[id number]  Richard [name] is a 63 y.o. male coming in with complaint of right shoulder pain. ROM is limited. States the shoulder gets better and will then get worse. 2 weeks ago his ROM was limited.   Onset- 1 year Location - joint, posterior  Duration-  Character- Aggravating factors- flexion  Reliving factors- Asprin  Therapies tried-  Severity-   X-rays of patient's shoulder taken on January 05, 2018 were independently visualized by me showing no significant bony abnormality  Past Medical History:  Diagnosis Date  . Alcoholism in remission ([place]) 08/25/2011  . Allergy    seasonal  . CAD (coronary artery disease) 08/25/2011  . Depression   . Gout   . Hyperlipidemia   . Hypertension   . OSA on CPAP 08/21/2018   Mild OSA at 11/hr now on CPAP at 11cm H2O.   . Sleep apnea    did use cpap about 10 years ago no use now  . Stroke [facility])    Past Surgical History:  Procedure Laterality Date  . COLONOSCOPY  10-22-11   3 polyps  . FINGER NEUROPLASTY     left index finger  . left achillies  2003  . POLYPECTOMY     Social History   Socioeconomic History  . Marital status: Married    Spouse name: Not on file  . Number of children: 2  . Years of education: Not on file  . Highest education level: Not on file  Occupational History  . Occupation: Teacher, adult education    Comment: [employer]  . Financial resource strain: Not on file  . Food insecurity    Worry: Not on file    Inability: Not on file  . Transportation needs    Medical: Not on file    Non-medical: Not on file  Tobacco Use  . Smoking status: Never Smoker  . Smokeless tobacco: Never Used  Substance and Sexual Activity  . Alcohol use: No   . Drug use: No  . Sexual activity: Not on file  Lifestyle  . Physical activity    Days per week: Not on file    Minutes per session: Not on file  . Stress: Not on file  Relationships  . Social Herbalist on phone: Not on file    Gets together: Not on file    Attends religious service: Not on file    Active member of club or organization: Not on file    Attends meetings of clubs or organizations: Not on file    Relationship status: Not on file  Other Topics Concern  . Not on file  Social History Narrative  . Not on file   Allergies  Allergen Reactions  . Penicillins Other (See Comments)    Siblings are allergic Has patient had a PCN reaction causing immediate rash, facial/tongue/throat swelling, SOB or lightheadedness with hypotension: Unknown Has patient had a PCN reaction causing severe rash involving mucus membranes or skin necrosis: Unknown Has patient had a PCN reaction that required hospitalization: Unknown Has patient had a PCN reaction occurring within the last 10 years: Unknown If all of the above answers are "NO", then may  proceed with Cephalosporin use. Told to avoid by parents   Family History  Problem Relation Age of Onset  . Hypertension Mother   . Diabetes Mother   . Heart attack Brother   . Colon cancer Neg Hx   . Stomach cancer Neg Hx   . Rectal cancer Neg Hx   . Esophageal cancer Neg Hx      Current Outpatient Medications (Cardiovascular):  .  atorvastatin (LIPITOR) 80 MG tablet, Take 1 tablet (80 mg total) by mouth daily. Marland Kitchen  diltiazem (CARDIZEM CD) 240 MG 24 hr capsule, Take 1 capsule (240 mg total) by mouth daily. Marland Kitchen  ezetimibe (ZETIA) 10 MG tablet, Take 1 tablet (10 mg total) by mouth daily. Marland Kitchen  lisinopril (ZESTRIL) 5 MG tablet, Take 1 tablet (5 mg total) by mouth daily. .  metoprolol succinate (TOPROL-XL) 100 MG 24 hr tablet, Take 1 tablet (100 mg total) by mouth daily. Take with or immediately following a meal.    Current Outpatient  Medications (Hematological):  .  apixaban (ELIQUIS) 5 MG TABS tablet, Take 1 tablet (5 mg total) by mouth 2 (two) times daily.  Current Outpatient Medications (Other):  Marland Kitchen  Vitamin D, Ergocalciferol, (DRISDOL) 1.25 MG (50000 UT) CAPS capsule, Take 1 capsule (50,000 Units total) by mouth every 7 (seven) days.    Past medical history, social, surgical and family history all reviewed in electronic medical record.  No pertanent information unless stated regarding to the chief complaint.   Review of Systems:  No headache, visual changes, nausea, vomiting, diarrhea, constipation, dizziness, abdominal pain, skin rash, fevers, chills, night sweats, weight loss, swollen lymph nodes, body aches, joint swelling, muscle aches, chest pain, shortness of breath, mood changes.   Objective  There were no vitals taken for this visit. Systems examined below as of    General: No apparent distress alert and oriented x3 mood and affect normal, dressed appropriately.  HEENT: Pupils equal, extraocular movements intact  Respiratory: Patient's speak in full sentences and does not appear short of breath  Cardiovascular: No lower extremity edema, non tender, no erythema  Skin: Warm dry intact with no signs of infection or rash on extremities or on axial skeleton.  Abdomen: Soft nontender  Neuro: Cranial nerves II through XII are intact, neurovascularly intact in all extremities with 2+ DTRs and 2+ pulses.  Lymph: No lymphadenopathy of posterior or anterior cervical chain or axillae bilaterally.  Gait normal with good balance and coordination.  MSK:  Non tender with full range of motion and good stability and symmetric strength and tone of  elbows, wrist, hip, knee and ankles bilaterally.  Shoulder: Right Inspection reveals no abnormalities, atrophy or asymmetry. Palpation is normal with no tenderness over AC joint or bicipital groove. ROM is full in all planes passively. Rotator cuff strength normal throughout.  signs of impingement with positive Neer and Hawkin's tests, but negative empty can sign. Speeds and Yergason's tests normal. No labral pathology noted with negative Obrien's, negative clunk and good stability.  Positive crossover Normal scapular function observed. No painful arc and no drop arm sign. No apprehension sign  MSK US performed of: Right This study was ordered, performed, and interpreted by Charlann Boxer D.O.  Shoulder:   Supraspinatus: Partial tear noted but no significant retraction bursal bulge seen with shoulder abduction on impingement view. Infraspinatus:  Appears normal on long and transverse views. Significant increase in Doppler flow AC joint: Severe arthritis and capsulitis Glenohumeral Joint:  Appears normal without effusion. Glenoid Labrum:  Intact without visualized tears. Biceps Tendon:  Appears normal on long and transverse views, no fraying of tendon, tendon located in intertubercular groove, no subluxation with shoulder internal or external rotation.  Impression: Partial tear of the rotator cuff with acromioclavicular arthritis  97110; 15 additional minutes spent for Therapeutic exercises as stated in above notes.  This included exercises focusing on stretching, strengthening, with significant focus on eccentric aspects.   Long term goals include an improvement in range of motion, strength, endurance as well as avoiding reinjury. Patient's frequency would include in 1-2 times a day, 3-5 times a week for a duration of 6-12 weeks. Shoulder Exercises that included:  Basic scapular stabilization to include adduction and depression of scapula Scaption, focusing on proper movement and good control Internal and External rotation utilizing a theraband, with elbow tucked at side entire time Rows with theraband which was given    Proper technique shown and discussed handout in great detail with ATC.  All questions were discussed and answered.     Impression and  Recommendations:     This case required medical decision making of moderate complexity. The above documentation has been reviewed and is accurate and complete Lyndal Pulley, DO       Note: This dictation was prepared with Dragon dictation along with smaller phrase technology. Any transcriptional errors that result from this process are unintentional.

## 2019-02-02 ENCOUNTER — Other Ambulatory Visit: Payer: Self-pay

## 2019-02-02 ENCOUNTER — Ambulatory Visit (INDEPENDENT_AMBULATORY_CARE_PROVIDER_SITE_OTHER): Payer: Federal, State, Local not specified - PPO | Admitting: Family Medicine

## 2019-02-02 ENCOUNTER — Ambulatory Visit: Payer: Self-pay

## 2019-02-02 ENCOUNTER — Encounter: Payer: Self-pay | Admitting: Family Medicine

## 2019-02-02 VITALS — BP 120/74 | HR 60 | Ht 72.0 in | Wt 210.0 lb

## 2019-02-02 DIAGNOSIS — G8929 Other chronic pain: Secondary | ICD-10-CM

## 2019-02-02 DIAGNOSIS — M19011 Primary osteoarthritis, right shoulder: Secondary | ICD-10-CM | POA: Diagnosis not present

## 2019-02-02 DIAGNOSIS — M75100 Unspecified rotator cuff tear or rupture of unspecified shoulder, not specified as traumatic: Secondary | ICD-10-CM | POA: Insufficient documentation

## 2019-02-02 DIAGNOSIS — M25511 Pain in right shoulder: Secondary | ICD-10-CM

## 2019-02-02 DIAGNOSIS — S46011A Strain of muscle(s) and tendon(s) of the rotator cuff of right shoulder, initial encounter: Secondary | ICD-10-CM

## 2019-02-02 DIAGNOSIS — M19019 Primary osteoarthritis, unspecified shoulder: Secondary | ICD-10-CM | POA: Insufficient documentation

## 2019-02-02 MED ORDER — PENNSAID 2 % TD SOLN: 2.0000 g | Freq: Two times a day (BID) | TRANSDERMAL | 3 refills | Status: DC

## 2019-02-02 NOTE — Assessment & Plan Note (Signed)
Arthritis noted with a partial tear of the rotator cuff.  Discussed with patient about icing regimen, home exercises, topical anti-inflammatories.  Discussed the possibility of injection which patient declined.  Discussed the possibility of physical therapy which patient also declined.  We will start with the conservative therapy and follow-up again in 4 to 6 weeks

## 2019-02-02 NOTE — Patient Instructions (Signed)
Good to see you.  Ice 20 minutes 2 times daily. Usually after activity and before bed. Exercises 3 times a week.  pennsaid pinkie amount topically 2 times daily as needed.  Keep hands within peripheral vision  See me again in 6 weeks and we will consider injection

## 2019-02-02 NOTE — Assessment & Plan Note (Signed)
Partial tear.  Should do well with conservative therapy.  Discussed icing regimen and home exercises, which activities to do which wants to avoid.  Discussed posture and ergonomics.  Worsening pain consider injection and formal physical therapy

## 2019-02-20 ENCOUNTER — Ambulatory Visit (INDEPENDENT_AMBULATORY_CARE_PROVIDER_SITE_OTHER): Payer: Federal, State, Local not specified - PPO

## 2019-02-20 DIAGNOSIS — E538 Deficiency of other specified B group vitamins: Secondary | ICD-10-CM | POA: Diagnosis not present

## 2019-02-20 MED ORDER — CYANOCOBALAMIN 1000 MCG/ML IJ SOLN
1000.0000 ug | Freq: Once | INTRAMUSCULAR | Status: AC
  Administered 2019-02-20: 1000 ug via INTRAMUSCULAR

## 2019-02-20 NOTE — Progress Notes (Signed)
Medical screening examination/treatment/procedure(s) were performed by non-physician practitioner and as supervising physician I was immediately available for consultation/collaboration. I agree with above. James John, MD   

## 2019-02-26 DIAGNOSIS — G4733 Obstructive sleep apnea (adult) (pediatric): Secondary | ICD-10-CM | POA: Diagnosis not present

## 2019-03-11 ENCOUNTER — Other Ambulatory Visit: Payer: Self-pay | Admitting: Internal Medicine

## 2019-03-18 ENCOUNTER — Other Ambulatory Visit: Payer: Self-pay

## 2019-03-18 ENCOUNTER — Encounter: Payer: Self-pay | Admitting: Family Medicine

## 2019-03-18 ENCOUNTER — Ambulatory Visit: Payer: Federal, State, Local not specified - PPO | Admitting: Family Medicine

## 2019-03-18 ENCOUNTER — Ambulatory Visit: Payer: Self-pay

## 2019-03-18 VITALS — BP 100/54 | HR 76 | Ht 72.0 in | Wt 211.0 lb

## 2019-03-18 DIAGNOSIS — M25511 Pain in right shoulder: Secondary | ICD-10-CM

## 2019-03-18 DIAGNOSIS — S46011A Strain of muscle(s) and tendon(s) of the rotator cuff of right shoulder, initial encounter: Secondary | ICD-10-CM

## 2019-03-18 DIAGNOSIS — G8929 Other chronic pain: Secondary | ICD-10-CM | POA: Diagnosis not present

## 2019-03-18 NOTE — Assessment & Plan Note (Signed)
Patient is doing significantly better at this time.  Discussed with patient about posture and ergonomics, keeping hands within peripheral vision.  Do not feel that another imaging was significantly needed at the moment.  Follow-up again 6 to 8 weeks.

## 2019-03-18 NOTE — Progress Notes (Signed)
[name] [place], [place] [address] Phone: [phone] Subjective:   Richard [name], am serving as a scribe for Dr. Hulan Saas.    CC: Right shoulder pain follow-up  [id number]   02/02/2019 Partial tear.  Should do well with conservative therapy.  Discussed icing regimen and home exercises, which activities to do which wants to avoid.  Discussed posture and ergonomics.  Worsening pain consider injection and formal physical therapy  Arthritis noted with a partial tear of the rotator cuff.  Discussed with patient about icing regimen, home exercises, topical anti-inflammatories.  Discussed the possibility of injection which patient declined.  Discussed the possibility of physical therapy which patient also declined.  We will start with the conservative therapy and follow-up again in 4 to 6 weeks  Update 03/18/2019 Richard [name] is a 63 y.o. male coming in with complaint of right shoulder pain. Patient has been doing well. Has not had any pain since last visit. Did notice pain with HEP but the pain subsided quickly. Has not played golf or done anything to test the shoulder.      Previous visit showed the patient did have a partial tear of the rotator cuff as well as acromioclavicular arthritis.  Past Medical History:  Diagnosis Date  . Alcoholism in remission ([place]) 08/25/2011  . Allergy    seasonal  . CAD (coronary artery disease) 08/25/2011  . Depression   . Gout   . Hyperlipidemia   . Hypertension   . OSA on CPAP 08/21/2018   Mild OSA at 11/hr now on CPAP at 11cm H2O.   . Sleep apnea    did use cpap about 10 years ago [name] use now  . Stroke [facility])    Past Surgical History:  Procedure Laterality Date  . COLONOSCOPY  10-22-11   3 polyps  . FINGER NEUROPLASTY     left index finger  . left achillies  2003  . POLYPECTOMY     Social History   Socioeconomic History  . Marital status: Married    Spouse name: Not on file  . Number  of children: 2  . Years of education: Not on file  . Highest education level: Not on file  Occupational History  . Occupation: Teacher, adult education    Comment: [employer]  . Financial resource strain: Not on file  . Food insecurity    Worry: Not on file    Inability: Not on file  . Transportation needs    Medical: Not on file    Non-medical: Not on file  Tobacco Use  . Smoking status: Never Smoker  . Smokeless tobacco: Never Used  Substance and Sexual Activity  . Alcohol use: [name]  . Drug use: [name]  . Sexual activity: Not on file  Lifestyle  . Physical activity    Days per week: Not on file    Minutes per session: Not on file  . Stress: Not on file  Relationships  . Social Herbalist on phone: Not on file    Gets together: Not on file    Attends religious service: Not on file    Active member of club or organization: Not on file    Attends meetings of clubs or organizations: Not on file    Relationship status: Not on file  Other Topics Concern  . Not on file  Social History Narrative  . Not on file   Allergies  Allergen  Reactions  . Penicillins Other (See Comments)    Siblings are allergic Has patient had a PCN reaction causing immediate rash, facial/tongue/throat swelling, SOB or lightheadedness with hypotension: Unknown Has patient had a PCN reaction causing severe rash involving mucus membranes or skin necrosis: Unknown Has patient had a PCN reaction that required hospitalization: Unknown Has patient had a PCN reaction occurring within the last 10 years: Unknown If all of the above answers are "[name]", then may proceed with Cephalosporin use. Told to avoid by parents   Family History  Problem Relation Age of Onset  . Hypertension Mother   . Diabetes Mother   . Heart attack Brother   . Colon cancer Neg Hx   . Stomach cancer Neg Hx   . Rectal cancer Neg Hx   . Esophageal cancer Neg Hx      Current Outpatient Medications (Cardiovascular):  .   atorvastatin (LIPITOR) 80 MG tablet, Take 1 tablet (80 mg total) by mouth daily. Marland Kitchen  diltiazem (CARDIZEM CD) 240 MG 24 hr capsule, Take 1 capsule (240 mg total) by mouth daily. Marland Kitchen  ezetimibe (ZETIA) 10 MG tablet, Take 1 tablet (10 mg total) by mouth daily. Marland Kitchen  lisinopril (ZESTRIL) 5 MG tablet, Take 1 tablet (5 mg total) by mouth daily. .  metoprolol succinate (TOPROL-XL) 100 MG 24 hr tablet, Take 1 tablet (100 mg total) by mouth daily. Take with or immediately following a meal.    Current Outpatient Medications (Hematological):  .  apixaban (ELIQUIS) 5 MG TABS tablet, Take 1 tablet (5 mg total) by mouth 2 (two) times daily.  Current Outpatient Medications (Other):  Marland Kitchen  Diclofenac Sodium (PENNSAID) 2 % SOLN, Place 2 g onto the skin 2 (two) times daily. .  Vitamin D, Ergocalciferol, (DRISDOL) 1.25 MG (50000 UT) CAPS capsule, Take 1 capsule (50,000 Units total) by mouth every 7 (seven) days.    Past medical history, social, surgical and family history all reviewed in electronic medical record.  [name] pertanent information unless stated regarding to the chief complaint.   Review of Systems:  [name] headache, visual changes, nausea, vomiting, diarrhea, constipation, dizziness, abdominal pain, skin rash, fevers, chills, night sweats, weight loss, swollen lymph nodes, body aches, joint swelling,chest pain, shortness of breath, mood changes.  Positive muscle aches  Objective  Blood pressure (!) 100/54, pulse 76, height 6' (1.829 m), weight 211 lb (95.7 kg), SpO2 97 %.    General: [name] apparent distress alert and oriented x3 mood and affect normal, dressed appropriately.  HEENT: Pupils equal, extraocular movements intact  Respiratory: Patient's speak in full sentences and does not appear short of breath  Cardiovascular: [name] lower extremity edema, non tender, [name] erythema  Skin: Warm dry intact with [name] signs of infection or rash on extremities or on axial skeleton.  Abdomen: Soft nontender  Neuro: Cranial  nerves II through XII are intact, neurovascularly intact in all extremities with 2+ DTRs and 2+ pulses.  Lymph: [name] lymphadenopathy of posterior or anterior cervical chain or axillae bilaterally.  Gait normal with good balance and coordination.  MSK:  Non tender with full range of motion and good stability and symmetric strength and tone of , elbows, wrist, hip, knee and ankles bilaterally.  Right shoulder exam shows the patient has improvement in range of motion.  Still some mild tender to palpation over the lateral joint space.  Mild positive impingement.  Mild positive O'Brien's.    Impression and Recommendations:     . The above documentation has been  reviewed and is accurate and complete Lyndal Pulley, DO       Note: This dictation was prepared with Dragon dictation along with smaller phrase technology. Any transcriptional errors that result from this process are unintentional.

## 2019-03-18 NOTE — Patient Instructions (Signed)
Keep hands in peripheral vision See me again in 6-8 weeks Enjoy golf

## 2019-03-19 ENCOUNTER — Telehealth: Payer: Self-pay

## 2019-03-19 NOTE — Telephone Encounter (Signed)
Copied from [place] [phone]. Topic: General - Other >> Mar 18, 2019 12:45 PM Keene Breath wrote: Reason for CRM: Patient would like for Jonelle Sidle to call him regarding his next B12 shot  lvm advising I am returning this patient's call, ok to transfer to [place] office

## 2019-03-19 NOTE — Telephone Encounter (Signed)
Copied from [place] [id number]. Topic: General - Other >> Mar 19, 2019  2:34 PM Richard [name] wrote: Reason for CRM: pt called in returning [place] call.  Patient added flu shot to his appt on Monday, wanted to make sure both injections could be given on same day

## 2019-03-23 ENCOUNTER — Other Ambulatory Visit: Payer: Self-pay

## 2019-03-23 ENCOUNTER — Ambulatory Visit (INDEPENDENT_AMBULATORY_CARE_PROVIDER_SITE_OTHER): Payer: Federal, State, Local not specified - PPO

## 2019-03-23 DIAGNOSIS — E538 Deficiency of other specified B group vitamins: Secondary | ICD-10-CM | POA: Diagnosis not present

## 2019-03-23 DIAGNOSIS — Z23 Encounter for immunization: Secondary | ICD-10-CM

## 2019-03-23 MED ORDER — CYANOCOBALAMIN 1000 MCG/ML IJ SOLN
1000.0000 ug | Freq: Once | INTRAMUSCULAR | Status: AC
  Administered 2019-03-23: 1000 ug via INTRAMUSCULAR

## 2019-03-23 NOTE — Progress Notes (Signed)
Medical screening examination/treatment/procedure(s) were performed by non-physician practitioner and as supervising physician I was immediately available for consultation/collaboration. I agree with above. James John, MD   

## 2019-03-28 DIAGNOSIS — G4733 Obstructive sleep apnea (adult) (pediatric): Secondary | ICD-10-CM | POA: Diagnosis not present

## 2019-04-23 ENCOUNTER — Ambulatory Visit (INDEPENDENT_AMBULATORY_CARE_PROVIDER_SITE_OTHER): Payer: Federal, State, Local not specified - PPO

## 2019-04-23 ENCOUNTER — Other Ambulatory Visit: Payer: Self-pay

## 2019-04-23 DIAGNOSIS — E538 Deficiency of other specified B group vitamins: Secondary | ICD-10-CM | POA: Diagnosis not present

## 2019-04-23 MED ORDER — CYANOCOBALAMIN 1000 MCG/ML IJ SOLN
1000.0000 ug | Freq: Once | INTRAMUSCULAR | Status: AC
  Administered 2019-04-23: 1000 ug via INTRAMUSCULAR

## 2019-04-23 NOTE — Progress Notes (Signed)
Medical screening examination/treatment/procedure(s) were performed by non-physician practitioner and as supervising physician I was immediately available for consultation/collaboration. I agree with above. Oriah Leinweber, MD   

## 2019-04-28 DIAGNOSIS — G4733 Obstructive sleep apnea (adult) (pediatric): Secondary | ICD-10-CM | POA: Diagnosis not present

## 2019-04-29 ENCOUNTER — Ambulatory Visit: Payer: Federal, State, Local not specified - PPO | Admitting: Family Medicine

## 2019-05-22 ENCOUNTER — Ambulatory Visit (INDEPENDENT_AMBULATORY_CARE_PROVIDER_SITE_OTHER): Payer: Federal, State, Local not specified - PPO

## 2019-05-22 ENCOUNTER — Other Ambulatory Visit: Payer: Self-pay

## 2019-05-22 DIAGNOSIS — E538 Deficiency of other specified B group vitamins: Secondary | ICD-10-CM | POA: Diagnosis not present

## 2019-05-22 MED ORDER — CYANOCOBALAMIN 1000 MCG/ML IJ SOLN
1000.0000 ug | Freq: Once | INTRAMUSCULAR | Status: AC
  Administered 2019-05-22: 1000 ug via INTRAMUSCULAR

## 2019-05-22 NOTE — Progress Notes (Signed)
Medical screening examination/treatment/procedure(s) were performed by non-physician practitioner and as supervising physician I was immediately available for consultation/collaboration. I agree with above. Rumor Sun, MD   

## 2019-06-24 ENCOUNTER — Encounter: Payer: Self-pay | Admitting: Internal Medicine

## 2019-06-24 ENCOUNTER — Other Ambulatory Visit: Payer: Self-pay

## 2019-06-24 ENCOUNTER — Ambulatory Visit (INDEPENDENT_AMBULATORY_CARE_PROVIDER_SITE_OTHER): Payer: Federal, State, Local not specified - PPO | Admitting: Internal Medicine

## 2019-06-24 VITALS — BP 124/82 | HR 54 | Temp 98.6°F | Resp 12 | Ht 72.0 in | Wt 211.0 lb

## 2019-06-24 DIAGNOSIS — R739 Hyperglycemia, unspecified: Secondary | ICD-10-CM | POA: Diagnosis not present

## 2019-06-24 DIAGNOSIS — E538 Deficiency of other specified B group vitamins: Secondary | ICD-10-CM | POA: Diagnosis not present

## 2019-06-24 DIAGNOSIS — Z0000 Encounter for general adult medical examination without abnormal findings: Secondary | ICD-10-CM

## 2019-06-24 DIAGNOSIS — E7800 Pure hypercholesterolemia, unspecified: Secondary | ICD-10-CM

## 2019-06-24 DIAGNOSIS — I10 Essential (primary) hypertension: Secondary | ICD-10-CM | POA: Diagnosis not present

## 2019-06-24 DIAGNOSIS — E559 Vitamin D deficiency, unspecified: Secondary | ICD-10-CM

## 2019-06-24 NOTE — Assessment & Plan Note (Signed)
stable overall by history and exam, recent data reviewed with pt, and pt to continue medical treatment as before,  to f/u any worsening symptoms or concerns  

## 2019-06-24 NOTE — Progress Notes (Signed)
Subjective:    Patient ID: Richard [name], male    DOB: 21-May-1956, 64 y.o.   MRN: [id number]  HPI Here to f/u; overall doing ok,  Pt denies chest pain, increasing sob or doe, wheezing, orthopnea, PND, increased LE swelling, palpitations, dizziness or syncope.  Pt denies new neurological symptoms such as new headache, or facial or extremity weakness or numbness.  Pt denies polydipsia, polyuria, or low sugar episode.  Pt states overall good compliance with meds, mostly trying to follow appropriate diet, with wt overall stable,  but little exercise however.  Tolearting vit d and b12 replacement Past Medical History:  Diagnosis Date  . Alcoholism in remission ([place]) 08/25/2011  . Allergy    seasonal  . CAD (coronary artery disease) 08/25/2011  . Depression   . Gout   . Hyperlipidemia   . Hypertension   . OSA on CPAP 08/21/2018   Mild OSA at 11/hr now on CPAP at 11cm H2O.   . Sleep apnea    did use cpap about 10 years ago no use now  . Stroke [facility])    Past Surgical History:  Procedure Laterality Date  . COLONOSCOPY  10-22-11   3 polyps  . FINGER NEUROPLASTY     left index finger  . left achillies  2003  . POLYPECTOMY      reports that he has never smoked. He has never used smokeless tobacco. He reports that he does not drink alcohol or use drugs. family history includes Diabetes in his mother; Heart attack in his brother; Hypertension in his mother. Allergies  Allergen Reactions  . Penicillins Other (See Comments)    Siblings are allergic Has patient had a PCN reaction causing immediate rash, facial/tongue/throat swelling, SOB or lightheadedness with hypotension: Unknown Has patient had a PCN reaction causing severe rash involving mucus membranes or skin necrosis: Unknown Has patient had a PCN reaction that required hospitalization: Unknown Has patient had a PCN reaction occurring within the last 10 years: Unknown If all of the above answers are "NO", then may proceed with Cephalosporin  use. Told to avoid by parents   Current Outpatient Medications on File Prior to Visit  Medication Sig Dispense Refill  . apixaban (ELIQUIS) 5 MG TABS tablet Take 1 tablet (5 mg total) by mouth 2 (two) times daily. 180 tablet 1  . atorvastatin (LIPITOR) 80 MG tablet Take 1 tablet (80 mg total) by mouth daily. 90 tablet 2  . diltiazem (CARDIZEM CD) 240 MG 24 hr capsule Take 1 capsule (240 mg total) by mouth daily. 90 capsule 2  . ezetimibe (ZETIA) 10 MG tablet Take 1 tablet (10 mg total) by mouth daily. 90 tablet 2  . lisinopril (ZESTRIL) 5 MG tablet Take 1 tablet (5 mg total) by mouth daily. 90 tablet 2  . metoprolol succinate (TOPROL-XL) 100 MG 24 hr tablet Take 1 tablet (100 mg total) by mouth daily. Take with or immediately following a meal. 90 tablet 2  . Diclofenac Sodium (PENNSAID) 2 % SOLN Place 2 g onto the skin 2 (two) times daily. (Patient not taking: Reported on 06/24/2019) 112 g 3  . Vitamin D, Ergocalciferol, (DRISDOL) 1.25 MG (50000 UT) CAPS capsule Take 1 capsule (50,000 Units total) by mouth every 7 (seven) days. (Patient not taking: Reported on 06/24/2019) 12 capsule 0   No current facility-administered medications on file prior to visit.   Review of Systems  Constitutional: Negative for other unusual diaphoresis or sweats HENT: Negative for ear discharge or  swelling Eyes: Negative for other worsening visual disturbances Respiratory: Negative for stridor or other swelling  Gastrointestinal: Negative for worsening distension or other blood Genitourinary: Negative for retention or other urinary change Musculoskeletal: Negative for other MSK pain or swelling Skin: Negative for color change or other new lesions Neurological: Negative for worsening tremors and other numbness  Psychiatric/Behavioral: Negative for worsening agitation or other fatigue All otherwise neg per pt     Objective:   Physical Exam BP 124/82   Pulse (!) 54   Temp 98.6 F (37 C)   Resp 12   Ht 6'  (1.829 m)   Wt 211 lb (95.7 kg)   SpO2 97%   BMI 28.62 kg/m  VS noted,  Constitutional: Pt appears in NAD HENT: Head: NCAT.  Right Ear: External ear normal.  Left Ear: External ear normal.  Eyes: . Pupils are equal, round, and reactive to light. Conjunctivae and EOM are normal Nose: without d/c or deformity Neck: Neck supple. Gross normal ROM Cardiovascular: Normal rate and regular rhythm.   Pulmonary/Chest: Effort normal and breath sounds without rales or wheezing.  Abd:  Soft, NT, ND, + BS, no organomegaly Neurological: Pt is alert. At baseline orientation, motor grossly intact Skin: Skin is warm. No rashes, other new lesions, no LE edema Psychiatric: Pt behavior is normal without agitation  All otherwise neg per pt Lab Results  Component Value Date   WBC 8.0 12/22/2018   HGB 16.4 12/22/2018   HCT 48.5 12/22/2018   PLT 187.0 12/22/2018   GLUCOSE 86 12/22/2018   CHOL 109 12/22/2018   TRIG 64.0 12/22/2018   HDL 45.40 12/22/2018   LDLDIRECT 146.2 08/30/2011   LDLCALC 51 12/22/2018   ALT 37 12/22/2018   AST 25 12/22/2018   NA 139 12/22/2018   K 4.0 12/22/2018   CL 107 12/22/2018   CREATININE 1.04 12/22/2018   BUN 13 12/22/2018   CO2 22 12/22/2018   TSH 1.73 12/22/2018   PSA 1.90 11/01/2017   INR 0.98 01/05/2018   HGBA1C 5.8 12/22/2018         Assessment & Plan:

## 2019-06-24 NOTE — Patient Instructions (Signed)
Please continue all other medications as before, and refills have been done if requested.  Please have the pharmacy call with any other refills you may need.  Please continue your efforts at being more active, low cholesterol diet, and weight control.  You are otherwise up to date with prevention measures today.  Please keep your appointments with your specialists as you may have planned  Please return in 6 months, or sooner if needed, with Lab testing done 3-5 days before at the [facility] - please make appt as you leave today for this

## 2019-06-24 NOTE — Assessment & Plan Note (Signed)
Cont oral replacement 

## 2019-06-24 NOTE — Assessment & Plan Note (Signed)
Ok to change to oral replacement

## 2019-07-08 DIAGNOSIS — L821 Other seborrheic keratosis: Secondary | ICD-10-CM | POA: Diagnosis not present

## 2019-07-08 DIAGNOSIS — D2261 Melanocytic nevi of right upper limb, including shoulder: Secondary | ICD-10-CM | POA: Diagnosis not present

## 2019-07-08 DIAGNOSIS — L812 Freckles: Secondary | ICD-10-CM | POA: Diagnosis not present

## 2019-07-08 DIAGNOSIS — L738 Other specified follicular disorders: Secondary | ICD-10-CM | POA: Diagnosis not present

## 2019-07-17 ENCOUNTER — Other Ambulatory Visit: Payer: Self-pay | Admitting: Cardiology

## 2019-07-17 NOTE — Telephone Encounter (Signed)
Eliquis 5mg  refill request received, pt is 64 yrs old, weight-95.7kg, Crea-1.04 on 7/6/220, Diagnosis-Afib, and last seen by Dr. Marlou Porch on 08/28/2018 and an appt pending on 09/02/2019. Dose is appropriate based on dosing criteria. Will send in refill to requested pharmacy.

## 2019-08-12 DIAGNOSIS — H52203 Unspecified astigmatism, bilateral: Secondary | ICD-10-CM | POA: Diagnosis not present

## 2019-08-12 DIAGNOSIS — H25013 Cortical age-related cataract, bilateral: Secondary | ICD-10-CM | POA: Diagnosis not present

## 2019-09-02 ENCOUNTER — Ambulatory Visit: Payer: Federal, State, Local not specified - PPO | Admitting: Cardiology

## 2019-09-02 ENCOUNTER — Encounter: Payer: Self-pay | Admitting: Gastroenterology

## 2019-09-02 ENCOUNTER — Other Ambulatory Visit: Payer: Self-pay

## 2019-09-02 ENCOUNTER — Encounter: Payer: Self-pay | Admitting: Cardiology

## 2019-09-02 VITALS — BP 120/70 | HR 58 | Ht 72.0 in | Wt 210.0 lb

## 2019-09-02 DIAGNOSIS — Z8673 Personal history of transient ischemic attack (TIA), and cerebral infarction without residual deficits: Secondary | ICD-10-CM

## 2019-09-02 DIAGNOSIS — I480 Paroxysmal atrial fibrillation: Secondary | ICD-10-CM

## 2019-09-02 NOTE — Progress Notes (Signed)
Cardiology Office Note:    Date:  09/02/2019   ID:  Richard [name], DOB 03-29-1956, MRN [id number]  PCP:  Biagio Borg, MD  Cardiologist:  Candee Furbish, MD  Electrophysiologist:  None   Referring MD: Biagio Borg, MD     History of Present Illness:    Richard [name] is a 64 y.o. male here for the follow-up of coronary artery disease.  Proximal LAD disease on CT scan nonobstructive less than 40% in 2012. Atrial fibrillation was also noted.  EF was 40 to 45% on a prior echocardiogram. Dr. Rayann Heman was part of the discussion.  Diltiazem added.  Previously had no awareness of atrial fibrillation.  No chest pain no shortness of breath.  Previously had had a lengthy discussion about rhythm versus rate control.  Asymptomatic, continue with rate control.  Currently in sinus rhythm.  Previously stroke was in garage, fell to the floor.  He is separated his right shoulder after he was told this on his ultrasound.  He did have some fine motor dexterity issues following the stroke.  These have resolved.  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  Doing well.  Past Medical History:  Diagnosis Date  . Alcoholism in remission ([place]) 08/25/2011  . Allergy    seasonal  . CAD (coronary artery disease) 08/25/2011  . Depression   . Gout   . Hyperlipidemia   . Hypertension   . OSA on CPAP 08/21/2018   Mild OSA at 11/hr now on CPAP at 11cm H2O.   . Sleep apnea    did use cpap about 10 years ago no use now  . Stroke [facility])     Past Surgical History:  Procedure Laterality Date  . COLONOSCOPY  10-22-11   3 polyps  . FINGER NEUROPLASTY     left index finger  . left achillies  2003  . POLYPECTOMY      Current Medications: Current Meds  Medication Sig  . atorvastatin (LIPITOR) 80 MG tablet Take 1 tablet (80 mg total) by mouth daily.  Marland Kitchen diltiazem (CARDIZEM CD) 240 MG 24 hr capsule Take 1 capsule (240 mg total) by mouth daily.  Marland Kitchen ELIQUIS 5 MG TABS tablet TAKE 1 TABLET BY MOUTH TWICE A  DAY  . ezetimibe (ZETIA) 10 MG tablet Take 1 tablet (10 mg total) by mouth daily.  Marland Kitchen lisinopril (ZESTRIL) 5 MG tablet Take 1 tablet (5 mg total) by mouth daily.  . metoprolol succinate (TOPROL-XL) 100 MG 24 hr tablet Take 1 tablet (100 mg total) by mouth daily. Take with or immediately following a meal.  . Vitamin D, Ergocalciferol, (DRISDOL) 1.25 MG (50000 UT) CAPS capsule Take 1 capsule (50,000 Units total) by mouth every 7 (seven) days.     Allergies:   Penicillins   Social History   Socioeconomic History  . Marital status: Married    Spouse name: Not on file  . Number of children: 2  . Years of education: Not on file  . Highest education level: Not on file  Occupational History  . Occupation: Teacher, adult education    Comment: [employer]  Tobacco Use  . Smoking status: Never Smoker  . Smokeless tobacco: Never Used  Substance and Sexual Activity  . Alcohol use: No  . Drug use: No  . Sexual activity: Not on file  Other Topics Concern  . Not on file  Social History Narrative  . Not on file   Social Determinants of Health   Financial Resource  Strain:   . Difficulty of Paying Living Expenses:   Food Insecurity:   . Worried About Charity fundraiser in the Last Year:   . Arboriculturist in the Last Year:   Transportation Needs:   . Film/video editor (Medical):   Marland Kitchen Lack of Transportation (Non-Medical):   Physical Activity:   . Days of Exercise per Week:   . Minutes of Exercise per Session:   Stress:   . Feeling of Stress :   Social Connections:   . Frequency of Communication with Friends and Family:   . Frequency of Social Gatherings with Friends and Family:   . Attends Religious Services:   . Active Member of Clubs or Organizations:   . Attends Archivist Meetings:   Marland Kitchen Marital Status:      Family History: The patient's family history includes Diabetes in his mother; Heart attack in his brother; Hypertension in his mother. There is no history of Colon cancer,  Stomach cancer, Rectal cancer, or Esophageal cancer.  ROS:   Please see the history of present illness.     All other systems reviewed and are negative.  EKGs/Labs/Other Studies Reviewed:    The following studies were reviewed today:  ECHO 2019 - Left ventricle: The cavity size was normal. Systolic function was  mildly to moderately reduced. The estimated ejection fraction was  in the range of 40% to 45%. Moderate diffuse hypokinesis with no  identifiable regional variations.  - Aortic valve: There was mild regurgitation.  - Left atrium: The atrium was mildly dilated.  - Right atrium: The atrium was mildly dilated.  - Pulmonary arteries: PA peak pressure: 31 mm Hg (S).   MRI 2019: 1. Patchy small volume acute ischemic cortical infarcts involving the left parietotemporal region as above. Scant associated petechial hemorrhage without hemorrhagic transformation or mass effect. 2. Otherwise normal brain MRI.   EKG:  EKG is  ordered today.  The ekg ordered today demonstrates rhythm 58 no other specific abnormalities.  Recent Labs: 12/22/2018: ALT 37; BUN 13; Creatinine, Ser 1.04; Hemoglobin 16.4; Platelets 187.0; Potassium 4.0; Sodium 139; TSH 1.73  Recent Lipid Panel    Component Value Date/Time   CHOL 109 12/22/2018 1529   CHOL 116 05/08/2018 1346   TRIG 64.0 12/22/2018 1529   HDL 45.40 12/22/2018 1529   HDL 43 05/08/2018 1346   CHOLHDL 2 12/22/2018 1529   VLDL 12.8 12/22/2018 1529   LDLCALC 51 12/22/2018 1529   LDLCALC 58 05/08/2018 1346   LDLDIRECT 146.2 08/30/2011 1350    Physical Exam:    VS:  BP 120/70   Pulse (!) 58   Ht 6' (1.829 m)   Wt 210 lb (95.3 kg)   SpO2 98%   BMI 28.48 kg/m     Wt Readings from Last 3 Encounters:  09/02/19 210 lb (95.3 kg)  06/24/19 211 lb (95.7 kg)  03/18/19 211 lb (95.7 kg)     GEN:  Well nourished, well developed in no acute distress HEENT: Normal NECK: No JVD; No carotid bruits LYMPHATICS: No  lymphadenopathy CARDIAC: RRR, no murmurs, rubs, gallops RESPIRATORY:  Clear to auscultation without rales, wheezing or rhonchi  ABDOMEN: Soft, non-tender, non-distended MUSCULOSKELETAL:  No edema; No deformity  SKIN: Warm and dry NEUROLOGIC:  Alert and oriented x 3 PSYCHIATRIC:  Normal affect   ASSESSMENT:    1. Paroxysmal atrial fibrillation (HCC)   2. History of stroke    PLAN:    In order of  problems listed above:  Paroxysmal atrial fibrillation -When he presented for prior cardioversion, he was actually back in normal rhythm.  Continues to be in sinus bradycardia 58.  Excellent. Sometimes may feel afib with pulse. Most of time in rhythm. If in AFIB may be irratable sooner.   Chronic anticoagulation -Continue with Eliquis.  Coronary artery disease -Continue with medical management.  Statin therapy.  Zetia.  Prior history of stroke. -Remarkable recovery.  Ejection fraction -Previously 40-45, repeat echocardiogram.    Medication Adjustments/Labs and Tests Ordered: Current medicines are reviewed at length with the patient today.  Concerns regarding medicines are outlined above.  Orders Placed This Encounter  Procedures  . Basic metabolic panel  . CBC  . EKG 12-Lead  . ECHOCARDIOGRAM COMPLETE   No orders of the defined types were placed in this encounter.   Patient Instructions  Medication Instructions:  Your physician recommends that you continue on your current medications as directed. Please refer to the Current Medication list given to you today.  *If you need a refill on your cardiac medications before your next appointment, please call your pharmacy*   Lab Work: TODAY: BMET If you have labs (blood work) drawn today and your tests are completely normal, you will receive your results only by: Marland Kitchen MyChart Message (if you have MyChart) OR . A paper copy in the mail If you have any lab test that is abnormal or we need to change your treatment, we will call you  to review the results.   Testing/Procedures: Your physician has requested that you have an echocardiogram. Echocardiography is a painless test that uses sound waves to create images of your heart. It provides your doctor with information about the size and shape of your heart and how well your heart's chambers and valves are working. This procedure takes approximately one hour. There are no restrictions for this procedure.   Follow-Up: At [facility], you and your health needs are our priority.  As part of our continuing mission to provide you with exceptional heart care, we have created designated Provider Care Teams.  These Care Teams include your primary Cardiologist (physician) and Advanced Practice Providers (APPs -  Physician Assistants and Nurse Practitioners) who all work together to provide you with the care you need, when you need it.   Your next appointment:   1 year(s)  The format for your next appointment:   In Person  Provider:   Candee Furbish, MD     Signed, Candee Furbish, MD  09/02/2019 3:33 PM    [place]

## 2019-09-02 NOTE — Patient Instructions (Signed)
Medication Instructions:  Your physician recommends that you continue on your current medications as directed. Please refer to the Current Medication list given to you today.  *If you need a refill on your cardiac medications before your next appointment, please call your pharmacy*   Lab Work: TODAY: BMET If you have labs (blood work) drawn today and your tests are completely normal, you will receive your results only by: Marland Kitchen MyChart Message (if you have MyChart) OR . A paper copy in the mail If you have any lab test that is abnormal or we need to change your treatment, we will call you to review the results.   Testing/Procedures: Your physician has requested that you have an echocardiogram. Echocardiography is a painless test that uses sound waves to create images of your heart. It provides your doctor with information about the size and shape of your heart and how well your heart's chambers and valves are working. This procedure takes approximately one hour. There are no restrictions for this procedure.   Follow-Up: At [facility], you and your health needs are our priority.  As part of our continuing mission to provide you with exceptional heart care, we have created designated Provider Care Teams.  These Care Teams include your primary Cardiologist (physician) and Advanced Practice Providers (APPs -  Physician Assistants and Nurse Practitioners) who all work together to provide you with the care you need, when you need it.   Your next appointment:   1 year(s)  The format for your next appointment:   In Person  Provider:   Candee Furbish, MD

## 2019-09-03 LAB — BASIC METABOLIC PANEL
BUN/Creatinine Ratio: 13 (ref 10–24)
BUN: 12 mg/dL (ref 8–27)
CO2: 20 mmol/L (ref 20–29)
Calcium: 9.3 mg/dL (ref 8.6–10.2)
Chloride: 107 mmol/L — ABNORMAL HIGH (ref 96–106)
Creatinine, Ser: 0.9 mg/dL (ref 0.76–1.27)
GFR calc Af Amer: 103 mL/min/{1.73_m2} (ref >59)
GFR calc non Af Amer: 89 mL/min/{1.73_m2} (ref >59)
Glucose: 84 mg/dL (ref 65–99)
Potassium: 4.3 mmol/L (ref 3.5–5.2)
Sodium: 142 mmol/L (ref 134–144)

## 2019-09-03 LAB — CBC
Hematocrit: 46.2 % (ref 37.5–51.0)
Hemoglobin: 16 g/dL (ref 13.0–17.7)
MCH: 32.1 pg (ref 26.6–33.0)
MCHC: 34.6 g/dL (ref 31.5–35.7)
MCV: 93 fL (ref 79–97)
Platelets: 189 10*3/uL (ref 150–450)
RBC: 4.98 x10E6/uL (ref 4.14–5.80)
RDW: 12.3 % (ref 11.6–15.4)
WBC: 7 10*3/uL (ref 3.4–10.8)

## 2019-09-15 ENCOUNTER — Other Ambulatory Visit: Payer: Self-pay

## 2019-09-15 ENCOUNTER — Ambulatory Visit (HOSPITAL_COMMUNITY): Payer: Federal, State, Local not specified - PPO | Attending: Cardiology

## 2019-09-15 DIAGNOSIS — Z8673 Personal history of transient ischemic attack (TIA), and cerebral infarction without residual deficits: Secondary | ICD-10-CM | POA: Diagnosis not present

## 2019-09-15 DIAGNOSIS — I480 Paroxysmal atrial fibrillation: Secondary | ICD-10-CM | POA: Diagnosis not present

## 2019-09-16 ENCOUNTER — Telehealth: Payer: Self-pay | Admitting: *Deleted

## 2019-09-16 DIAGNOSIS — I77810 Thoracic aortic ectasia: Secondary | ICD-10-CM

## 2019-09-16 NOTE — Telephone Encounter (Signed)
Spoke with the pt and informed him of his echo results and recommendations per Dr. Marlou Porch.  Informed the pt that I will go ahead and place the echo in the system to have repeated in one year, and send a message to our [facility] schedulers/Echo scheduler, to call him back and arrange this for one year out, or call closer to that time frame to have arranged.  Pt verbalized understanding and agrees with this plan.

## 2019-09-16 NOTE — Telephone Encounter (Signed)
-----   Message from Jerline Pain, MD sent at 09/16/2019  9:43 AM EDT ----- Improved pump function from 40-45% to normal 60-65%. Aortic root is mildly dilated-43 mm. Repeat Echo in one year to monitor.   Candee Furbish MD

## 2019-09-17 NOTE — Telephone Encounter (Signed)
Richard [name], Echo [place], left the pt a message to call the office back,  to arrange echo for one year out, for March 2022.

## 2019-10-03 ENCOUNTER — Other Ambulatory Visit: Payer: Self-pay | Admitting: Cardiology

## 2019-10-15 ENCOUNTER — Other Ambulatory Visit: Payer: Self-pay | Admitting: Cardiology

## 2019-11-10 IMAGING — MR MR HEAD W/O CM
10 of 11 series · 42 of 48 positions shown · non-contrast
Comparison: Prior study from earlier the same day.

CLINICAL DATA: Initial evaluation for focal neural deficit.

EXAM:
MRI HEAD WITHOUT CONTRAST
TECHNIQUE: Multiplanar, multiecho pulse sequences of the brain and surrounding
structures were obtained without intravenous contrast.

[Series 5: ax dwi_tracew · axial · 3.0mm · 1.50mm/px · z∈[-48,+104]mm · 9 of 80 slices shown]
[im 1/80]
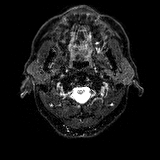
[im 10/80]
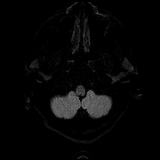
[im 20/80]
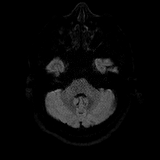
[im 30/80]
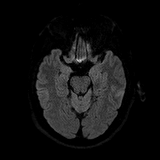
[im 40/80]
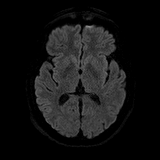
[im 50/80]
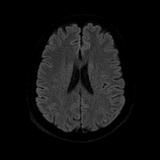
[im 60/80]
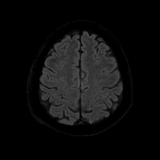
[im 70/80]
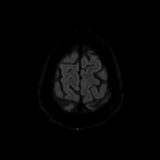
[im 80/80]
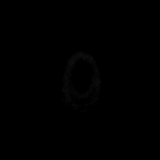

[Series 6: ax dwi_adc · axial · 3.0mm · 1.50mm/px · z∈[-48,+104]mm · 4 of 40 slices shown]
[im 1/40]
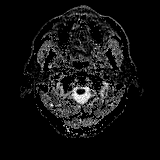
[im 14/40]
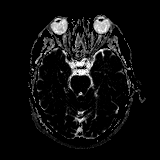
[im 27/40]
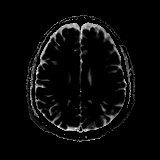
[im 40/40]
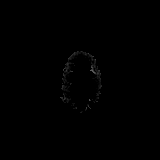

[Series 7: cor dwi_tracew · coronal · 5.0mm · 1.44mm/px · 6 of 68 slices shown]
[im 1/68]
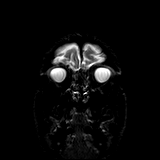
[im 14/68]
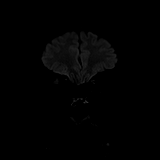
[im 27/68]
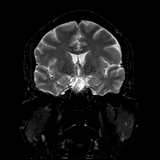
[im 41/68]
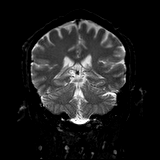
[im 54/68]
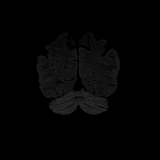
[im 68/68]
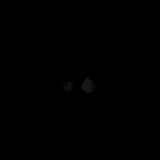

[Series 8: cor dwi_adc · coronal · 5.0mm · 1.44mm/px · 3 of 34 slices shown]
[im 1/34]
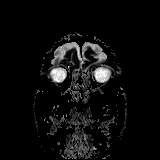
[im 17/34]
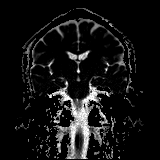
[im 34/34]
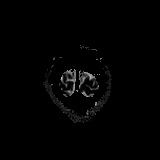

[Series 9: T1 · sagittal · 5.0mm · 0.75mm/px · 2 of 24 slices shown]
[im 1/24]
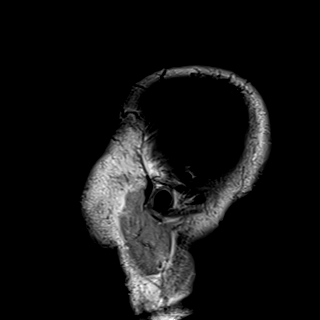
[im 24/24]
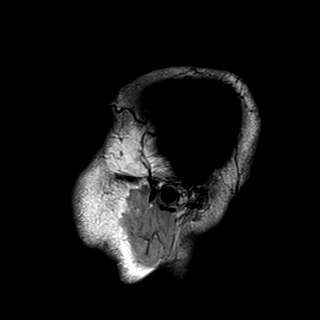

[Series 10: T2 · axial · 5.0mm · 0.72mm/px · z∈[-34,+115]mm · 2 of 26 slices shown (1 of 2)]
[im 1/26]
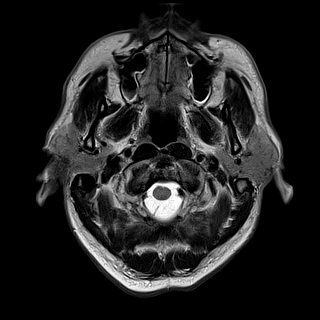
[im 26/26]
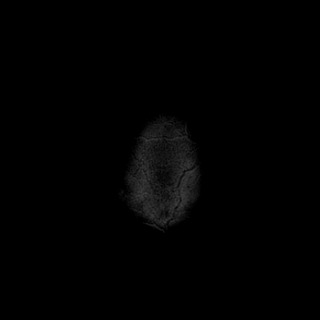

[Series 11: FLAIR · axial · 5.0mm · 0.45mm/px · z∈[-34,+116]mm · 2 of 26 slices shown]
[im 1/26]
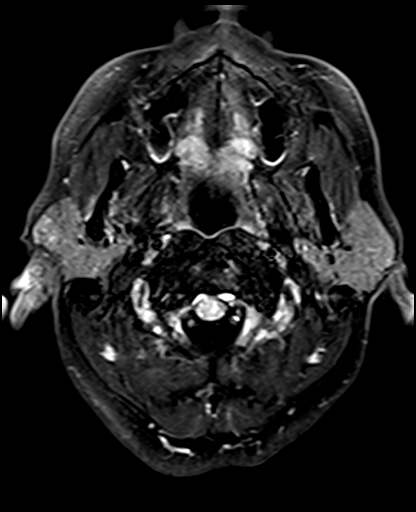
[im 26/26]
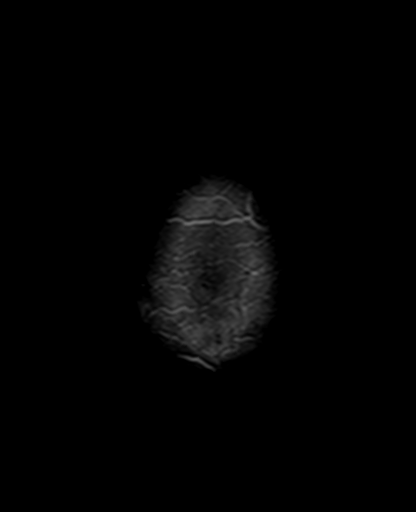

[Series 12: swi_images · axial · 3.0mm · 0.90mm/px · z∈[-47,+130]mm · 6 of 60 slices shown]
[im 1/60]
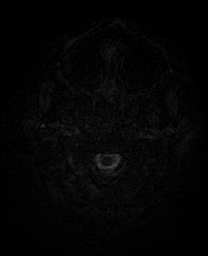
[im 12/60]
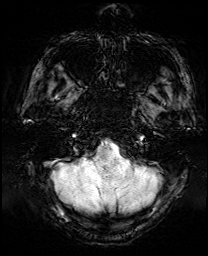
[im 24/60]
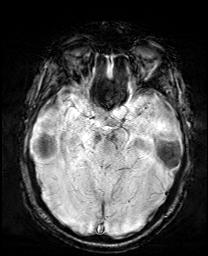
[im 36/60]
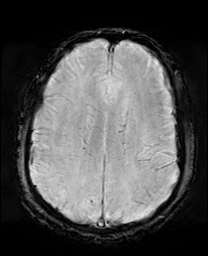
[im 48/60]
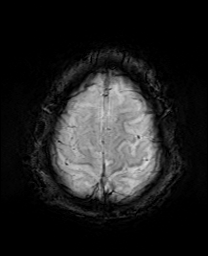
[im 60/60]
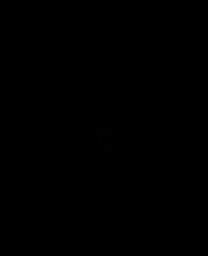

[Series 13: mip_images(sw) · axial · 24.0mm · 0.90mm/px · z∈[-36,+119]mm · 5 of 53 slices shown]
[im 1/53]
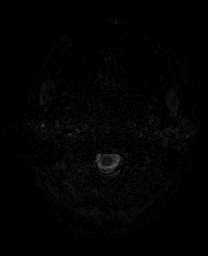
[im 14/53]
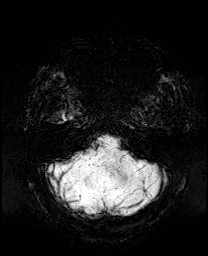
[im 27/53]
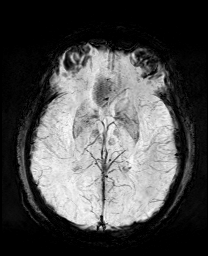
[im 40/53]
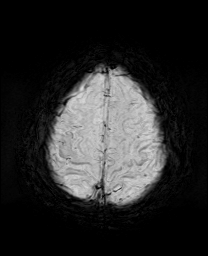
[im 53/53]
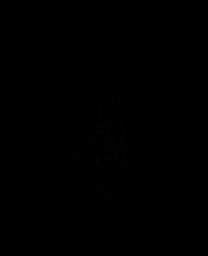

[Series 15: T2 · coronal · 5.0mm · 0.34mm/px · 3 of 32 slices shown (2 of 2)]
[im 1/32]
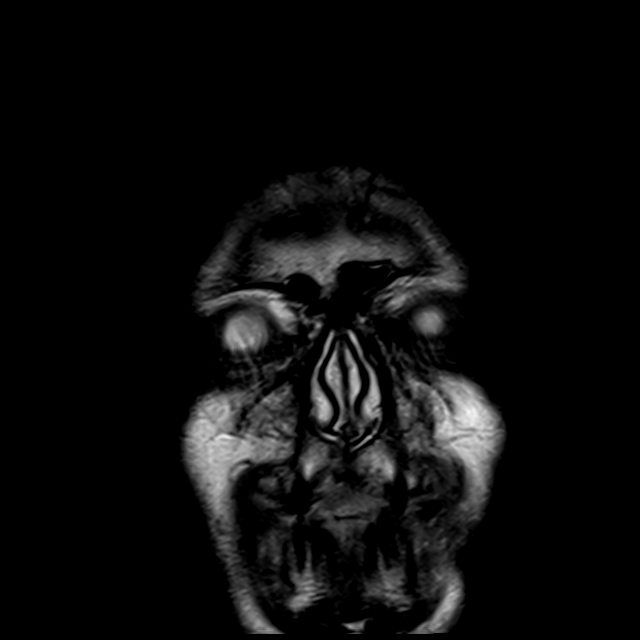
[im 16/32]
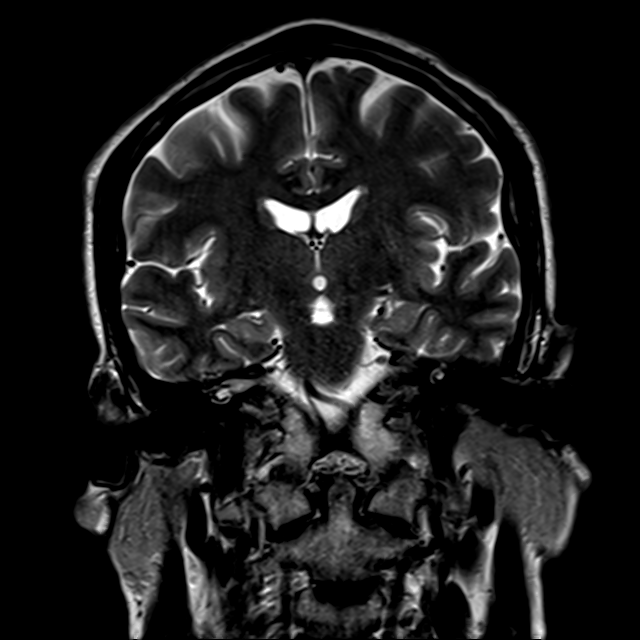
[im 32/32]
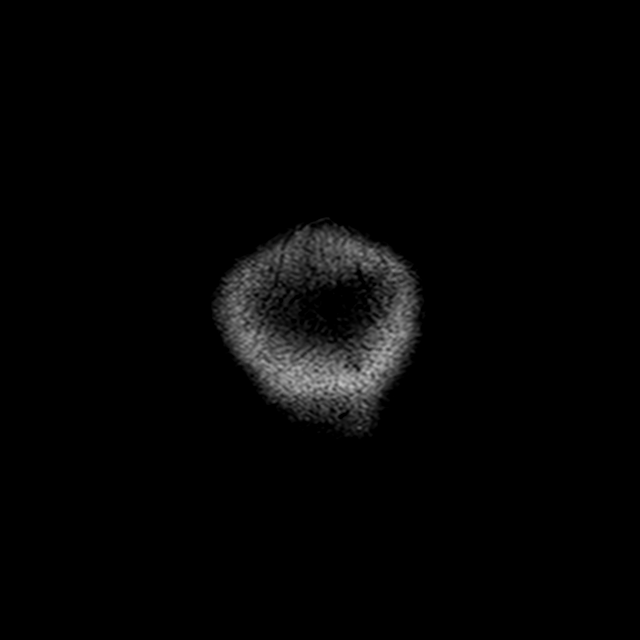

[42 of 48 positions shown; findings below may reference images not displayed]

FINDINGS: Brain: Cerebral volume within normal limits. No significant cerebral
white matter changes.

Patchy small volume foci of diffusion abnormality seen involving the
cortical gray matter of the left temporal and parietal region
(series 5, image 57, 63, 67), consistent with acute ischemic
infarcts. Single small focus of susceptibility artifact at the
parietal region suggestive of petechial hemorrhage (series 12, image
640). No frank hemorrhagic transformation. No mass effect.

No other evidence for acute or subacute ischemia. Gray-white matter
differentiation otherwise maintained. No other areas of chronic
infarction. No other evidence for acute or chronic intracranial
hemorrhage.

No mass lesion, midline shift or mass effect. No hydrocephalus. No
extra-axial fluid collection. Pituitary gland normal.

Vascular: Major intracranial vascular flow voids maintained

Skull and upper cervical spine: Craniocervical junction normal.
Upper cervical spine normal. Bone marrow signal intensity normal. No
scalp soft tissue abnormality.

Sinuses/Orbits: Globes and orbital soft tissues within normal
limits. Scattered mucosal thickening throughout the paranasal
sinuses. No mastoid effusion. Inner ear structures normal.

Other: None.
IMPRESSION: 1. Patchy small volume acute ischemic cortical infarcts involving
the left parietotemporal region as above. Scant associated petechial
hemorrhage without hemorrhagic transformation or mass effect.
2. Otherwise normal brain MRI.

## 2019-11-15 ENCOUNTER — Other Ambulatory Visit: Payer: Self-pay | Admitting: Cardiology

## 2019-12-17 ENCOUNTER — Other Ambulatory Visit: Payer: Self-pay

## 2019-12-17 ENCOUNTER — Other Ambulatory Visit: Payer: Federal, State, Local not specified - PPO

## 2019-12-17 ENCOUNTER — Other Ambulatory Visit (INDEPENDENT_AMBULATORY_CARE_PROVIDER_SITE_OTHER): Payer: Federal, State, Local not specified - PPO

## 2019-12-17 DIAGNOSIS — E7800 Pure hypercholesterolemia, unspecified: Secondary | ICD-10-CM

## 2019-12-17 DIAGNOSIS — R739 Hyperglycemia, unspecified: Secondary | ICD-10-CM

## 2019-12-17 DIAGNOSIS — E538 Deficiency of other specified B group vitamins: Secondary | ICD-10-CM | POA: Diagnosis not present

## 2019-12-17 DIAGNOSIS — E559 Vitamin D deficiency, unspecified: Secondary | ICD-10-CM

## 2019-12-17 DIAGNOSIS — Z0000 Encounter for general adult medical examination without abnormal findings: Secondary | ICD-10-CM

## 2019-12-17 DIAGNOSIS — Z125 Encounter for screening for malignant neoplasm of prostate: Secondary | ICD-10-CM | POA: Diagnosis not present

## 2019-12-17 LAB — CBC WITH DIFFERENTIAL/PLATELET
Basophils Absolute: 0.1 10*3/uL (ref 0.0–0.1)
Basophils Relative: 0.8 % (ref 0.0–3.0)
Eosinophils Absolute: 0.1 10*3/uL (ref 0.0–0.7)
Eosinophils Relative: 1.9 % (ref 0.0–5.0)
HCT: 44.7 % (ref 39.0–52.0)
Hemoglobin: 15.3 g/dL (ref 13.0–17.0)
Lymphocytes Relative: 29.7 % (ref 12.0–46.0)
Lymphs Abs: 2 10*3/uL (ref 0.7–4.0)
MCHC: 34.2 g/dL (ref 30.0–36.0)
MCV: 94 fl (ref 78.0–100.0)
Monocytes Absolute: 0.7 10*3/uL (ref 0.1–1.0)
Monocytes Relative: 10.3 % (ref 3.0–12.0)
Neutro Abs: 3.9 10*3/uL (ref 1.4–7.7)
Neutrophils Relative %: 57.3 % (ref 43.0–77.0)
Platelets: 148 10*3/uL — ABNORMAL LOW (ref 150.0–400.0)
RBC: 4.75 Mil/uL (ref 4.22–5.81)
RDW: 13.4 % (ref 11.5–15.5)
WBC: 6.8 10*3/uL (ref 4.0–10.5)

## 2019-12-17 LAB — LIPID PANEL
Cholesterol: 102 mg/dL (ref 0–200)
HDL: 41.6 mg/dL (ref >39.00)
LDL Cholesterol: 45 mg/dL (ref 0–99)
NonHDL: 60.29
Total CHOL/HDL Ratio: 2
Triglycerides: 76 mg/dL (ref 0.0–149.0)
VLDL: 15.2 mg/dL (ref 0.0–40.0)

## 2019-12-17 LAB — URINALYSIS, ROUTINE W REFLEX MICROSCOPIC
Bilirubin Urine: NEGATIVE
Hgb urine dipstick: NEGATIVE
Ketones, ur: NEGATIVE
Leukocytes,Ua: NEGATIVE
Nitrite: NEGATIVE
RBC / HPF: NONE SEEN (ref 0–?)
Specific Gravity, Urine: 1.02 (ref 1.000–1.030)
Total Protein, Urine: NEGATIVE
Urine Glucose: NEGATIVE
Urobilinogen, UA: 0.2 (ref 0.0–1.0)
WBC, UA: NONE SEEN (ref 0–?)
pH: 5.5 (ref 5.0–8.0)

## 2019-12-17 LAB — HEPATIC FUNCTION PANEL
ALT: 33 U/L (ref 0–53)
AST: 23 U/L (ref 0–37)
Albumin: 4.5 g/dL (ref 3.5–5.2)
Alkaline Phosphatase: 77 U/L (ref 39–117)
Bilirubin, Direct: 0.2 mg/dL (ref 0.0–0.3)
Total Bilirubin: 0.8 mg/dL (ref 0.2–1.2)
Total Protein: 6.6 g/dL (ref 6.0–8.3)

## 2019-12-17 LAB — BASIC METABOLIC PANEL
BUN: 16 mg/dL (ref 6–23)
CO2: 23 mEq/L (ref 19–32)
Calcium: 9.1 mg/dL (ref 8.4–10.5)
Chloride: 107 mEq/L (ref 96–112)
Creatinine, Ser: 1.13 mg/dL (ref 0.40–1.50)
GFR: 65.03 mL/min (ref >60.00)
Glucose, Bld: 96 mg/dL (ref 70–99)
Potassium: 4.1 mEq/L (ref 3.5–5.1)
Sodium: 140 mEq/L (ref 135–145)

## 2019-12-17 LAB — TSH: TSH: 1.21 u[IU]/mL (ref 0.35–4.50)

## 2019-12-17 LAB — VITAMIN D 25 HYDROXY (VIT D DEFICIENCY, FRACTURES): VITD: 18.65 ng/mL — ABNORMAL LOW (ref 30.00–100.00)

## 2019-12-17 LAB — HEMOGLOBIN A1C: Hgb A1c MFr Bld: 5.8 % (ref 4.6–6.5)

## 2019-12-17 LAB — PSA: PSA: 1.79 ng/mL (ref 0.10–4.00)

## 2019-12-17 LAB — VITAMIN B12: Vitamin B-12: 927 pg/mL — ABNORMAL HIGH (ref 211–911)

## 2019-12-22 ENCOUNTER — Other Ambulatory Visit: Payer: Self-pay

## 2019-12-22 ENCOUNTER — Ambulatory Visit: Payer: Federal, State, Local not specified - PPO | Admitting: Internal Medicine

## 2019-12-22 ENCOUNTER — Encounter: Payer: Self-pay | Admitting: Internal Medicine

## 2019-12-22 VITALS — BP 110/70 | HR 70 | Temp 98.0°F | Ht 72.0 in | Wt 209.0 lb

## 2019-12-22 DIAGNOSIS — E559 Vitamin D deficiency, unspecified: Secondary | ICD-10-CM

## 2019-12-22 DIAGNOSIS — E538 Deficiency of other specified B group vitamins: Secondary | ICD-10-CM | POA: Diagnosis not present

## 2019-12-22 DIAGNOSIS — Z0000 Encounter for general adult medical examination without abnormal findings: Secondary | ICD-10-CM | POA: Diagnosis not present

## 2019-12-22 DIAGNOSIS — R739 Hyperglycemia, unspecified: Secondary | ICD-10-CM

## 2019-12-22 NOTE — Assessment & Plan Note (Signed)
stable overall by history and exam, recent data reviewed with pt, and pt to continue medical treatment as before,  to f/u any worsening symptoms or concerns  

## 2019-12-22 NOTE — Assessment & Plan Note (Signed)

## 2019-12-22 NOTE — Patient Instructions (Addendum)
Please take OTC Vitamin D3 at 2000 units per day, indefinitely  Please continue all other medications as before, and refills have been done if requested.  Please have the pharmacy call with any other refills you may need.  Please continue your efforts at being more active, low cholesterol diet, and weight control.  You are otherwise up to date with prevention measures today.  Please keep your appointments with your specialists as you may have planned  Please make an Appointment to return for your 1 year visit, or sooner if needed, with Lab testing by Appointment as well, to be done about 3-5 days before at the FIRST FLOOR Lab (so this is for TWO appointments - please see the scheduling desk as you leave)  

## 2019-12-22 NOTE — Assessment & Plan Note (Signed)
Please change to OTC Vitamin D3 at 2000 units per day, indefinitely.  

## 2019-12-22 NOTE — Progress Notes (Signed)
Subjective:    Patient ID: Richard [name], male    DOB: 12/15/1955, 64 y.o.   MRN: [id number]  HPI  Here for wellness and f/u;  Overall doing ok;  Pt denies Chest pain, worsening SOB, DOE, wheezing, orthopnea, PND, worsening LE edema, palpitations, dizziness or syncope.  Pt denies neurological change such as new headache, facial or extremity weakness.  Pt denies polydipsia, polyuria, or low sugar symptoms. Pt states overall good compliance with treatment and medications, good tolerability, and has been trying to follow appropriate diet.  Pt denies worsening depressive symptoms, suicidal ideation or panic. No fever, night sweats, wt loss, loss of appetite, or other constitutional symptoms.  Pt states good ability with ADL's, has low fall risk, home safety reviewed and adequate, no other significant changes in hearing or vision, and only occasionally active with exercise. Not taking the Vit D3 recetnly. Past Medical History:  Diagnosis Date  . Alcoholism in remission ([place]) 08/25/2011  . Allergy    seasonal  . CAD (coronary artery disease) 08/25/2011  . Depression   . Gout   . Hyperlipidemia   . Hypertension   . OSA on CPAP 08/21/2018   Mild OSA at 11/hr now on CPAP at 11cm H2O.   . Sleep apnea    did use cpap about 10 years ago no use now  . Stroke [facility])    Past Surgical History:  Procedure Laterality Date  . COLONOSCOPY  10-22-11   3 polyps  . FINGER NEUROPLASTY     left index finger  . left achillies  2003  . POLYPECTOMY      reports that he has never smoked. He has never used smokeless tobacco. He reports that he does not drink alcohol and does not use drugs. family history includes Diabetes in his mother; Heart attack in his brother; Hypertension in his mother. Allergies  Allergen Reactions  . Penicillins Other (See Comments)    Siblings are allergic Has patient had a PCN reaction causing immediate rash, facial/tongue/throat swelling, SOB or lightheadedness with hypotension:  Unknown Has patient had a PCN reaction causing severe rash involving mucus membranes or skin necrosis: Unknown Has patient had a PCN reaction that required hospitalization: Unknown Has patient had a PCN reaction occurring within the last 10 years: Unknown If all of the above answers are "NO", then may proceed with Cephalosporin use. Told to avoid by parents   Current Outpatient Medications on File Prior to Visit  Medication Sig Dispense Refill  . atorvastatin (LIPITOR) 80 MG tablet TAKE 1 TABLET BY MOUTH EVERY DAY 90 tablet 3  . diltiazem (CARDIZEM CD) 240 MG 24 hr capsule TAKE 1 CAPSULE BY MOUTH EVERY DAY 90 capsule 3  . ELIQUIS 5 MG TABS tablet TAKE 1 TABLET BY MOUTH TWICE A DAY 180 tablet 1  . ezetimibe (ZETIA) 10 MG tablet TAKE 1 TABLET BY MOUTH EVERY DAY 90 tablet 3  . lisinopril (ZESTRIL) 5 MG tablet TAKE 1 TABLET BY MOUTH EVERY DAY 90 tablet 3  . metoprolol succinate (TOPROL-XL) 100 MG 24 hr tablet TAKE 1 TABLET (100 MG TOTAL) BY MOUTH DAILY. TAKE WITH OR IMMEDIATELY FOLLOWING A MEAL. 90 tablet 3   No current facility-administered medications on file prior to visit.   Review of Systems All otherwise neg per pt    Objective:   Physical Exam BP 110/70 (BP Location: Left Arm, Patient Position: Sitting, Cuff Size: Large)   Pulse 70   Temp 98 F (36.7 C) (Oral)  Ht 6' (1.829 m)   Wt 209 lb (94.8 kg)   SpO2 96%   BMI 28.35 kg/m  VS noted,  Constitutional: Pt appears in NAD HENT: Head: NCAT.  Right Ear: External ear normal.  Left Ear: External ear normal.  Eyes: . Pupils are equal, round, and reactive to light. Conjunctivae and EOM are normal Nose: without d/c or deformity Neck: Neck supple. Gross normal ROM Cardiovascular: Normal rate and regular rhythm.   Pulmonary/Chest: Effort normal and breath sounds without rales or wheezing.  Abd:  Soft, NT, ND, + BS, no organomegaly Neurological: Pt is alert. At baseline orientation, motor grossly intact Skin: Skin is warm. No  rashes, other new lesions, no LE edema Psychiatric: Pt behavior is normal without agitation  All otherwise neg per pt Lab Results  Component Value Date   WBC 6.8 12/17/2019   HGB 15.3 12/17/2019   HCT 44.7 12/17/2019   PLT 148.0 (L) 12/17/2019   GLUCOSE 96 12/17/2019   CHOL 102 12/17/2019   TRIG 76.0 12/17/2019   HDL 41.60 12/17/2019   LDLDIRECT 146.2 08/30/2011   LDLCALC 45 12/17/2019   ALT 33 12/17/2019   AST 23 12/17/2019   NA 140 12/17/2019   K 4.1 12/17/2019   CL 107 12/17/2019   CREATININE 1.13 12/17/2019   BUN 16 12/17/2019   CO2 23 12/17/2019   TSH 1.21 12/17/2019   PSA 1.79 12/17/2019   INR 0.98 01/05/2018   HGBA1C 5.8 12/17/2019       Assessment & Plan:

## 2019-12-25 ENCOUNTER — Other Ambulatory Visit: Payer: Self-pay | Admitting: Cardiology

## 2019-12-25 NOTE — Telephone Encounter (Signed)
Eliquis 5mg  refill request received. Patient is 64 years old, weight-94.8kg, Crea-1.13 on 12/17/2019, Diagnosis-Afib, and last seen by Dr. Marlou Porch on 09/02/2019. Dose is appropriate based on dosing criteria. Will send in refill to requested pharmacy.

## 2020-08-26 ENCOUNTER — Encounter (HOSPITAL_COMMUNITY): Payer: Self-pay | Admitting: Cardiology

## 2020-08-31 ENCOUNTER — Other Ambulatory Visit: Payer: Self-pay | Admitting: *Deleted

## 2020-08-31 DIAGNOSIS — I480 Paroxysmal atrial fibrillation: Secondary | ICD-10-CM

## 2020-08-31 DIAGNOSIS — I2510 Atherosclerotic heart disease of native coronary artery without angina pectoris: Secondary | ICD-10-CM

## 2020-09-02 ENCOUNTER — Ambulatory Visit: Payer: Federal, State, Local not specified - PPO | Admitting: Cardiology

## 2020-09-07 ENCOUNTER — Other Ambulatory Visit: Payer: Self-pay

## 2020-09-07 ENCOUNTER — Telehealth: Payer: Self-pay | Admitting: *Deleted

## 2020-09-07 ENCOUNTER — Ambulatory Visit: Payer: Federal, State, Local not specified - PPO | Admitting: Cardiology

## 2020-09-07 ENCOUNTER — Encounter: Payer: Self-pay | Admitting: Cardiology

## 2020-09-07 VITALS — BP 104/70 | HR 70 | Ht 72.0 in | Wt 211.0 lb

## 2020-09-07 DIAGNOSIS — I10 Essential (primary) hypertension: Secondary | ICD-10-CM | POA: Diagnosis not present

## 2020-09-07 DIAGNOSIS — I480 Paroxysmal atrial fibrillation: Secondary | ICD-10-CM | POA: Diagnosis not present

## 2020-09-07 DIAGNOSIS — G4733 Obstructive sleep apnea (adult) (pediatric): Secondary | ICD-10-CM

## 2020-09-07 NOTE — Telephone Encounter (Signed)
Order faxed to [facility]. 4 week reminder made to get download.

## 2020-09-07 NOTE — Patient Instructions (Signed)

## 2020-09-07 NOTE — Telephone Encounter (Signed)
-----   Message from Sueanne Margarita, MD sent at 09/07/2020  2:19 PM EDT ----- change him to auto CPAP from 4 to 15cm H2O and get a download in 4 weeks

## 2020-09-07 NOTE — Progress Notes (Signed)
Cardiology Office Note:    Date:  09/07/2020   ID:  Richard [name], DOB [birth year], MRN [id number]  PCP:  Biagio Borg, MD  Cardiologist:  Candee Furbish, MD    Referring MD: Biagio Borg, MD   Chief Complaint  Patient presents with  . Sleep Apnea  . Hypertension    History of Present Illness:    Richard [name] is a 65 y.o. male with a hx of ASCAD, PAF and CVA.  He was referred for sleep study which showed mild OSA with an AHI of 7.9/hr overall and moderate during REM sleep at 17.5/hr.  O2 sats dropped to 94.7%.  He underwent CPAP titration to 11cm H2O   He is doing well with He CPAP device and thinks that he has gotten used to it.  He tolerates the nasal mask and feels the pressure is adequate.  Since going on CPAP he feels rested in the am and has no significant daytime sleepiness.  He denies any significant mouth or nasal dryness or nasal congestion.  He does not think that he snores.    He is here today for followup and is doing well.  He denies any chest pain or pressure, SOB, DOE, PND, orthopnea, LE edema, dizziness or syncope. He occasionally will have some skipped beats.  He is compliant with his meds and is tolerating meds with no SE.     Past Medical History:  Diagnosis Date  . Alcoholism in remission ([place]) 08/25/2011  . Allergy    seasonal  . CAD (coronary artery disease) 08/25/2011  . Depression   . Gout   . Hyperlipidemia   . Hypertension   . OSA on CPAP 08/21/2018   Mild OSA at 11/hr now on CPAP at 11cm H2O.   . Sleep apnea    did use cpap about 10 years ago no use now  . Stroke [facility])     Past Surgical History:  Procedure Laterality Date  . COLONOSCOPY  10-22-11   3 polyps  . FINGER NEUROPLASTY     left index finger  . left achillies  2003  . POLYPECTOMY      Current Medications: Current Meds  Medication Sig  . atorvastatin (LIPITOR) 80 MG tablet TAKE 1 TABLET BY MOUTH EVERY DAY  . diltiazem (CARDIZEM CD) 240 MG 24 hr capsule TAKE 1 CAPSULE BY MOUTH  EVERY DAY  . ELIQUIS 5 MG TABS tablet TAKE 1 TABLET BY MOUTH TWICE A DAY  . ezetimibe (ZETIA) 10 MG tablet TAKE 1 TABLET BY MOUTH EVERY DAY  . lisinopril (ZESTRIL) 5 MG tablet TAKE 1 TABLET BY MOUTH EVERY DAY  . metoprolol succinate (TOPROL-XL) 100 MG 24 hr tablet TAKE 1 TABLET (100 MG TOTAL) BY MOUTH DAILY. TAKE WITH OR IMMEDIATELY FOLLOWING A MEAL.     Allergies:   Penicillins   Social History   Socioeconomic History  . Marital status: Married    Spouse name: Not on file  . Number of children: 2  . Years of education: Not on file  . Highest education level: Not on file  Occupational History  . Occupation: Teacher, adult education    Comment: [employer]  Tobacco Use  . Smoking status: Never Smoker  . Smokeless tobacco: Never Used  Vaping Use  . Vaping Use: Never used  Substance and Sexual Activity  . Alcohol use: No  . Drug use: No  . Sexual activity: Not on file  Other Topics Concern  . Not on file  Social History Narrative  . Not on file   Social Determinants of Health   Financial Resource Strain: Not on file  Food Insecurity: Not on file  Transportation Needs: Not on file  Physical Activity: Not on file  Stress: Not on file  Social Connections: Not on file     Family History: The patient's family history includes Diabetes in his mother; Heart attack in his brother; Hypertension in his mother. There is no history of Colon cancer, Stomach cancer, Rectal cancer, or Esophageal cancer.  ROS:   Please see the history of present illness.    ROS  All other systems reviewed and negative.   EKGs/Labs/Other Studies Reviewed:    The following studies were reviewed today: PAP download  EKG:  EKG is ordered today and showed NSR with no ST changes  Recent Labs: 12/17/2019: ALT 33; BUN 16; Creatinine, Ser 1.13; Hemoglobin 15.3; Platelets 148.0; Potassium 4.1; Sodium 140; TSH 1.21   Recent Lipid Panel    Component Value Date/Time   CHOL 102 12/17/2019 0937   CHOL 116 05/08/2018  1346   TRIG 76.0 12/17/2019 0937   HDL 41.60 12/17/2019 0937   HDL 43 05/08/2018 1346   CHOLHDL 2 12/17/2019 0937   VLDL 15.2 12/17/2019 0937   LDLCALC 45 12/17/2019 0937   LDLCALC 58 05/08/2018 1346   LDLDIRECT 146.2 08/30/2011 1350    Physical Exam:    VS:  BP 104/70 (BP Location: Left Arm, Patient Position: Sitting, Cuff Size: Normal)   Pulse 70   Ht 6' (1.829 m)   Wt 211 lb (95.7 kg)   SpO2 96%   BMI 28.62 kg/m     Wt Readings from Last 3 Encounters:  09/07/20 211 lb (95.7 kg)  12/22/19 209 lb (94.8 kg)  09/02/19 210 lb (95.3 kg)     GEN: Well nourished, well developed in no acute distress HEENT: Normal NECK: No JVD; No carotid bruits LYMPHATICS: No lymphadenopathy CARDIAC:RRR, no murmurs, rubs, gallops RESPIRATORY:  Clear to auscultation without rales, wheezing or rhonchi  ABDOMEN: Soft, non-tender, non-distended MUSCULOSKELETAL:  No edema; No deformity  SKIN: Warm and dry NEUROLOGIC:  Alert and oriented x 3 PSYCHIATRIC:  Normal affect    ASSESSMENT:    1. OSA (obstructive sleep apnea)   2. Primary hypertension   3. Paroxysmal atrial fibrillation (HCC)    PLAN:    In order of problems listed above:  1.  OSA - The patient is tolerating PAP therapy well without any problems. The PAP download was reviewed today and showed an AHI of 8/hr on 11 cm H2O with 20% compliance in using more than 4 hours nightly.  The patient has been using and benefiting from PAP use and will continue to benefit from therapy.  -his AHI is too high -I will change him to auto CPAP from 4 to 15cm H2O and get a download in 4 weeks  2.  HTN  -Bp is adequately controlled -continue Toprol XL 100mg  daily, Lisinopril 5mg  daily, Cardizem CD 240mg  daily   -SCr 1.13 in July 2021   3.  Paroxysmal atrial fibrillation  -he is maintaining NSR on exam today -he occasionally has some palpitations that sound more like PACs or PVCs and not PAF -continue Toprol and Cardizem -continue Eliquis 5mg   BID>>he denies any bleeding problems -Hbg stable at 15.3 in July 2021   Medication Adjustments/Labs and Tests Ordered: Current medicines are reviewed at length with the patient today.  Concerns regarding medicines are outlined above.  Orders Placed This Encounter  Procedures  . EKG 12-Lead   No orders of the defined types were placed in this encounter.   Signed, Fransico Him, MD  09/07/2020 2:14 PM    [facility]

## 2020-09-25 ENCOUNTER — Other Ambulatory Visit: Payer: Self-pay | Admitting: Cardiology

## 2020-09-26 ENCOUNTER — Other Ambulatory Visit: Payer: Self-pay

## 2020-09-26 ENCOUNTER — Ambulatory Visit (HOSPITAL_COMMUNITY): Payer: Federal, State, Local not specified - PPO | Attending: Cardiology

## 2020-09-26 DIAGNOSIS — I480 Paroxysmal atrial fibrillation: Secondary | ICD-10-CM

## 2020-09-26 DIAGNOSIS — I2583 Coronary atherosclerosis due to lipid rich plaque: Secondary | ICD-10-CM

## 2020-09-26 DIAGNOSIS — I2510 Atherosclerotic heart disease of native coronary artery without angina pectoris: Secondary | ICD-10-CM

## 2020-09-26 LAB — ECHOCARDIOGRAM COMPLETE
Area-P 1/2: 5.34 cm2
P 1/2 time: 1037 msec
S' Lateral: 2.6 cm

## 2020-10-14 ENCOUNTER — Other Ambulatory Visit: Payer: Self-pay | Admitting: Cardiology

## 2020-10-15 ENCOUNTER — Other Ambulatory Visit: Payer: Self-pay | Admitting: Cardiology

## 2020-10-17 NOTE — Telephone Encounter (Signed)
Prescription refill request for Eliquis received. Indication:afib  Last office visit: turner, 09/07/2020 Scr: 1.13, 12/17/2019 Age:  65 yo  Weight: 95.7 kg   Pt is on the correct dose of Eliquis per dosing criteria, prescription refill sent for Eliquis 5 mg BID.

## 2020-11-07 ENCOUNTER — Ambulatory Visit: Payer: Federal, State, Local not specified - PPO | Admitting: Cardiology

## 2020-11-07 ENCOUNTER — Encounter: Payer: Self-pay | Admitting: Cardiology

## 2020-11-07 ENCOUNTER — Other Ambulatory Visit: Payer: Self-pay

## 2020-11-07 VITALS — BP 120/70 | HR 47 | Ht 72.0 in | Wt 212.0 lb

## 2020-11-07 DIAGNOSIS — I2583 Coronary atherosclerosis due to lipid rich plaque: Secondary | ICD-10-CM

## 2020-11-07 DIAGNOSIS — I10 Essential (primary) hypertension: Secondary | ICD-10-CM | POA: Diagnosis not present

## 2020-11-07 DIAGNOSIS — E782 Mixed hyperlipidemia: Secondary | ICD-10-CM

## 2020-11-07 DIAGNOSIS — I2510 Atherosclerotic heart disease of native coronary artery without angina pectoris: Secondary | ICD-10-CM

## 2020-11-07 DIAGNOSIS — I480 Paroxysmal atrial fibrillation: Secondary | ICD-10-CM

## 2020-11-07 MED ORDER — METOPROLOL SUCCINATE ER 50 MG PO TB24: 50.0000 mg | ORAL_TABLET | Freq: Every day | ORAL | 3 refills | Status: DC

## 2020-11-07 NOTE — Progress Notes (Signed)
Cardiology Office Note:    Date:  11/07/2020   ID:  Richard [name], DOB 01/29/1956, MRN [id number]  PCP:  Biagio Borg, MD   [facility] Providers Cardiologist:  Candee Furbish, MD Sleep Medicine:  Fransico Him, MD     Referring MD: Biagio Borg, MD     History of Present Illness:    Richard [name] is a 65 y.o. male here for the follow-up of coronary artery disease.  2 months ago prior to sleep study, felt fainting feeling flushing.  This is while he was standing in the bathroom.  One time only.   He also states that he checks his pulse nightly.  Occasionally he will feel a skipped beat or 2.  Rare.  Overall doing well without any fevers chills nausea vomiting syncope bleeding.  Past Medical History:  Diagnosis Date  . Alcoholism in remission ([place]) 08/25/2011  . Allergy    seasonal  . CAD (coronary artery disease) 08/25/2011  . Depression   . Gout   . Hyperlipidemia   . Hypertension   . OSA on CPAP 08/21/2018   Mild OSA at 11/hr now on CPAP at 11cm H2O.   . Sleep apnea    did use cpap about 10 years ago no use now  . Stroke [facility])     Past Surgical History:  Procedure Laterality Date  . COLONOSCOPY  10-22-11   3 polyps  . FINGER NEUROPLASTY     left index finger  . left achillies  2003  . POLYPECTOMY      Current Medications: Current Meds  Medication Sig  . atorvastatin (LIPITOR) 80 MG tablet TAKE 1 TABLET BY MOUTH EVERY DAY  . diltiazem (CARDIZEM CD) 240 MG 24 hr capsule TAKE 1 CAPSULE BY MOUTH EVERY DAY  . ELIQUIS 5 MG TABS tablet TAKE 1 TABLET BY MOUTH TWICE A DAY  . ezetimibe (ZETIA) 10 MG tablet TAKE 1 TABLET BY MOUTH EVERY DAY  . lisinopril (ZESTRIL) 5 MG tablet TAKE 1 TABLET BY MOUTH EVERY DAY  . metoprolol succinate (TOPROL-XL) 50 MG 24 hr tablet Take 1 tablet (50 mg total) by mouth daily. Take with or immediately following a meal.  . [DISCONTINUED] metoprolol succinate (TOPROL-XL) 100 MG 24 hr tablet TAKE 1 TABLET (100 MG TOTAL) BY MOUTH DAILY. TAKE  WITH OR IMMEDIATELY FOLLOWING A MEAL.     Allergies:   Penicillins   Social History   Socioeconomic History  . Marital status: Married    Spouse name: Not on file  . Number of children: 2  . Years of education: Not on file  . Highest education level: Not on file  Occupational History  . Occupation: Teacher, adult education    Comment: [employer]  Tobacco Use  . Smoking status: Never Smoker  . Smokeless tobacco: Never Used  Vaping Use  . Vaping Use: Never used  Substance and Sexual Activity  . Alcohol use: No  . Drug use: No  . Sexual activity: Not on file  Other Topics Concern  . Not on file  Social History Narrative  . Not on file   Social Determinants of Health   Financial Resource Strain: Not on file  Food Insecurity: Not on file  Transportation Needs: Not on file  Physical Activity: Not on file  Stress: Not on file  Social Connections: Not on file     Family History: The patient's family history includes Diabetes in his mother; Heart attack in his brother; Hypertension in his mother.  There is no history of Colon cancer, Stomach cancer, Rectal cancer, or Esophageal cancer.  ROS:   Please see the history of present illness.     All other systems reviewed and are negative.  EKGs/Labs/Other Studies Reviewed:    The following studies were reviewed today:  Echocardiogram 09/26/2020: 1. Left ventricular ejection fraction, by estimation, is 45 to 50%. The  left ventricle has mildly decreased function. The left ventricle  demonstrates global hypokinesis. Left ventricular diastolic parameters are  indeterminate.  2. Right ventricular systolic function is normal. The right ventricular  size is mildly enlarged. There is normal pulmonary artery systolic  pressure. The estimated right ventricular systolic pressure is 82.9 mmHg.  3. Left atrial size was moderately dilated.  4. Right atrial size was mildly dilated.  5. The mitral valve is normal in structure. Trivial mitral  valve  regurgitation. No evidence of mitral stenosis.  6. The aortic valve is tricuspid. Aortic valve regurgitation is trivial.  No aortic stenosis is present.  7. Aortic dilatation noted. There is mild dilatation of the aortic root,  measuring 42 mm.  8. The inferior vena cava is normal in size with greater than 50%  respiratory variability, suggesting right atrial pressure of 3 mmHg.  9. Difficult telemetry baseline, possible atrial fibrillation.   EKG:  EKG is  ordered today.  The ekg ordered today demonstrates sinus bradycardia 47  Recent Labs: 12/17/2019: ALT 33; BUN 16; Creatinine, Ser 1.13; Hemoglobin 15.3; Platelets 148.0; Potassium 4.1; Sodium 140; TSH 1.21  Recent Lipid Panel    Component Value Date/Time   CHOL 102 12/17/2019 0937   CHOL 116 05/08/2018 1346   TRIG 76.0 12/17/2019 0937   HDL 41.60 12/17/2019 0937   HDL 43 05/08/2018 1346   CHOLHDL 2 12/17/2019 0937   VLDL 15.2 12/17/2019 0937   LDLCALC 45 12/17/2019 0937   LDLCALC 58 05/08/2018 1346   LDLDIRECT 146.2 08/30/2011 1350     Risk Assessment/Calculations:      Physical Exam:    VS:  BP 120/70 (BP Location: Left Arm, Patient Position: Sitting, Cuff Size: Normal)   Pulse (!) 47   Ht 6' (1.829 m)   Wt 212 lb (96.2 kg)   SpO2 98%   BMI 28.75 kg/m     Wt Readings from Last 3 Encounters:  11/07/20 212 lb (96.2 kg)  09/07/20 211 lb (95.7 kg)  12/22/19 209 lb (94.8 kg)     GEN:  Well nourished, well developed in no acute distress HEENT: Normal NECK: No JVD; No carotid bruits LYMPHATICS: No lymphadenopathy CARDIAC: brady 47, no murmurs, rubs, gallops RESPIRATORY:  Clear to auscultation without rales, wheezing or rhonchi  ABDOMEN: Soft, non-tender, non-distended MUSCULOSKELETAL:  No edema; No deformity  SKIN: Warm and dry NEUROLOGIC:  Alert and oriented x 3 PSYCHIATRIC:  Normal affect   ASSESSMENT:    1. Coronary artery disease due to lipid rich plaque   2. Primary hypertension   3.  Paroxysmal atrial fibrillation (HCC)   4. Mixed hyperlipidemia    PLAN:    In order of problems listed above:  Coronary artery disease - Proximal LAD disease seen on CT scan, nonobstructive less than 40% back in 2012.  Continue with medical management.  Hyperlipidemia - On statin therapy atorvastatin 80 mg once a day, Zetia 10 mg once a day.  No myalgias.  Doing well.  Last LDL 45 at goal.  ALT 33.  Continue with current medication management.  Paroxysmal atrial fibrillation - Discovered incidentally.  No awareness.  No chest pain no shortness of breath. - Previously had discussion about rhythm versus rate control.  Only previously tried cardioversion, he presented and it was in normal rhythm.  Has demonstrated sinus bradycardia, currently 47 on ECG.  We will go ahead and pull his metoprolol from 100 down to 50 mg once a day.  Chronic anticoagulation - Continue with Eliquis 5 mg twice a day for medical management.  Refills as needed.  No signs of major bleeding.  Last hemoglobin 15.3, creatinine 1.1 from outside labs.  Excellent.  Prior history of stroke - Excellent recovery.  Previously decreased ejection fraction - EF now 45 to 50%.  Continue with current medical management.  OSA --treated CPAP.  Dr. Radford Pax.  Starts out at 11 decreases to 4 during the night.      Medication Adjustments/Labs and Tests Ordered: Current medicines are reviewed at length with the patient today.  Concerns regarding medicines are outlined above.  Orders Placed This Encounter  Procedures  . EKG 12-Lead   Meds ordered this encounter  Medications  . metoprolol succinate (TOPROL-XL) 50 MG 24 hr tablet    Sig: Take 1 tablet (50 mg total) by mouth daily. Take with or immediately following a meal.    Dispense:  90 tablet    Refill:  3    Please d/c any other RX for metoprolol succinate - dose decrease    Patient Instructions  Medication Instructions:  Please decrease your Metoprolol Succinate to  50 mg daily. Continue all other medications as listed.  *If you need a refill on your cardiac medications before your next appointment, please call your pharmacy*  Follow-Up: At [facility], you and your health needs are our priority.  As part of our continuing mission to provide you with exceptional heart care, we have created designated Provider Care Teams.  These Care Teams include your primary Cardiologist (physician) and Advanced Practice Providers (APPs -  Physician Assistants and Nurse Practitioners) who all work together to provide you with the care you need, when you need it.  We recommend signing up for the patient portal called "MyChart".  Sign up information is provided on this After Visit Summary.  MyChart is used to connect with patients for Virtual Visits (Telemedicine).  Patients are able to view lab/test results, encounter notes, upcoming appointments, etc.  Non-urgent messages can be sent to your provider as well.   To learn more about what you can do with MyChart, go to [email or web address].    Your next appointment:   1 year(s)  The format for your next appointment:   In Person  Provider:   Candee Furbish, MD  Thank you for choosing [facility]!!         Signed, Candee Furbish, MD  11/07/2020 9:04 AM    [place]

## 2020-11-07 NOTE — Patient Instructions (Signed)
Medication Instructions:  Please decrease your Metoprolol Succinate to 50 mg daily. Continue all other medications as listed.  *If you need a refill on your cardiac medications before your next appointment, please call your pharmacy*  Follow-Up: At [facility], you and your health needs are our priority.  As part of our continuing mission to provide you with exceptional heart care, we have created designated Provider Care Teams.  These Care Teams include your primary Cardiologist (physician) and Advanced Practice Providers (APPs -  Physician Assistants and Nurse Practitioners) who all work together to provide you with the care you need, when you need it.  We recommend signing up for the patient portal called "MyChart".  Sign up information is provided on this After Visit Summary.  MyChart is used to connect with patients for Virtual Visits (Telemedicine).  Patients are able to view lab/test results, encounter notes, upcoming appointments, etc.  Non-urgent messages can be sent to your provider as well.   To learn more about what you can do with MyChart, go to [email or web address].    Your next appointment:   1 year(s)  The format for your next appointment:   In Person  Provider:   Candee Furbish, MD  Thank you for choosing [facility]!!

## 2020-12-07 NOTE — Progress Notes (Signed)
[facility] [place] Phone: [phone] Subjective:   Fontaine No, am serving as a scribe for Dr. Hulan Saas. This visit occurred during the SARS-CoV-2 public health emergency.  Safety protocols were in place, including screening questions prior to the visit, additional usage of staff PPE, and extensive cleaning of exam room while observing appropriate contact time as indicated for disinfecting solutions.   I'm seeing this patient by the request  of:  Biagio Borg, MD  CC: left shoulder   [id number]  03/18/2019 Patient is doing significantly better at this time.  Discussed with patient about posture and ergonomics, keeping hands within peripheral vision.  Do not feel that another imaging was significantly needed at the moment.  Follow-up again 6 to 8 weeks.  Update 12/08/2020 Richard [name] is a 65 y.o. male coming in with complaint of shoulder pain.  Patient was seen in 2 years ago and did have what appeared to be a partial tear of the rotator cuff as well as acromioclavicular arthritis.  Patient states that his pain is over anterior L shoulder. Feels shift in joint at times that will feel like electrical shock. Dropped newspaper and went to grab it which caused. Patient tried to grab milk with L arm and he had sharp pain followed by long period of achiness. Patient put in a french drain in his back yard and feels like he may have injured himself a month and half ago.        Past Medical History:  Diagnosis Date   Alcoholism in remission ([place]) 08/25/2011   Allergy    seasonal   CAD (coronary artery disease) 08/25/2011   Depression    Gout    Hyperlipidemia    Hypertension    OSA on CPAP 08/21/2018   Mild OSA at 11/hr now on CPAP at 11cm H2O.    Sleep apnea    did use cpap about 10 years ago no use now   Stroke [facility])    Past Surgical History:  Procedure Laterality Date   COLONOSCOPY  10-22-11   3 polyps    FINGER NEUROPLASTY     left index finger   left achillies  2003   POLYPECTOMY     Social History   Socioeconomic History   Marital status: Married    Spouse name: Not on file   Number of children: 2   Years of education: Not on file   Highest education level: Not on file  Occupational History   Occupation: Teacher, adult education    Comment: [employer]  Tobacco Use   Smoking status: Never   Smokeless tobacco: Never  Vaping Use   Vaping Use: Never used  Substance and Sexual Activity   Alcohol use: No   Drug use: No   Sexual activity: Not on file  Other Topics Concern   Not on file  Social History Narrative   Not on file   Social Determinants of Health   Financial Resource Strain: Not on file  Food Insecurity: Not on file  Transportation Needs: Not on file  Physical Activity: Not on file  Stress: Not on file  Social Connections: Not on file   Allergies  Allergen Reactions   Penicillins Other (See Comments)    Siblings are allergic Has patient had a PCN reaction causing immediate rash, facial/tongue/throat swelling, SOB or lightheadedness with hypotension: Unknown Has patient had a PCN reaction causing severe rash involving mucus membranes or skin  necrosis: Unknown Has patient had a PCN reaction that required hospitalization: Unknown Has patient had a PCN reaction occurring within the last 10 years: Unknown If all of the above answers are "NO", then may proceed with Cephalosporin use. Told to avoid by parents   Family History  Problem Relation Age of Onset   Hypertension Mother    Diabetes Mother    Heart attack Brother    Colon cancer Neg Hx    Stomach cancer Neg Hx    Rectal cancer Neg Hx    Esophageal cancer Neg Hx      Current Outpatient Medications (Cardiovascular):    atorvastatin (LIPITOR) 80 MG tablet, TAKE 1 TABLET BY MOUTH EVERY DAY   diltiazem (CARDIZEM CD) 240 MG 24 hr capsule, TAKE 1 CAPSULE BY MOUTH EVERY DAY   ezetimibe (ZETIA) 10 MG tablet, TAKE 1  TABLET BY MOUTH EVERY DAY   lisinopril (ZESTRIL) 5 MG tablet, TAKE 1 TABLET BY MOUTH EVERY DAY   metoprolol succinate (TOPROL-XL) 50 MG 24 hr tablet, Take 1 tablet (50 mg total) by mouth daily. Take with or immediately following a meal.    Current Outpatient Medications (Hematological):    ELIQUIS 5 MG TABS tablet, TAKE 1 TABLET BY MOUTH TWICE A DAY    Reviewed prior external information including notes and imaging from  primary care provider As well as notes that were available from care everywhere and other healthcare systems.  Past medical history, social, surgical and family history all reviewed in electronic medical record.  No pertanent information unless stated regarding to the chief complaint.   Review of Systems:  No headache, visual changes, nausea, vomiting, diarrhea, constipation, dizziness, abdominal pain, skin rash, fevers, chills, night sweats, weight loss, swollen lymph nodes, body aches, joint swelling, chest pain, shortness of breath, mood changes. POSITIVE muscle aches  Objective  Blood pressure 110/84, pulse (!) 56, height 6' (1.829 m), weight 204 lb (92.5 kg), SpO2 97 %.   General: No apparent distress alert and oriented x3 mood and affect normal, dressed appropriately.  HEENT: Pupils equal, extraocular movements intact  Respiratory: Patient's speak in full sentences and does not appear short of breath  Cardiovascular: No lower extremity edema, non tender, no erythema  Gait normal with good balance and coordination.  MSK: Left shoulder exam shows range of motion patient does have some pain with the terminal areas but does have full passive range of motion.  Rotator cuff strength 4 out of 5 with empty can.  Patient does have a positive crossover and positive O'Brien's that is fairly severe.  Positive lift off test posteriorly.  Limited musculoskeletal ultrasound was performed and interpreted by Lyndal Pulley Limited ultrasound of patient's right shoulder shows the  patient does have a fairly large abnormality noted of the supraspinatus.  Does appear to be an large tear noted with potential retraction.  Difficult to tell secondary to the hypoechoic changes.  Subscapularis appears to be intact.  Mild abnormality of the anterior labrum but more blunted than truly torn.  Bicep tendon fairly unremarkable. Impression: High-grade partial-thickness tear with questionable retraction of the supraspinatus   Impression and Recommendations:     The above documentation has been reviewed and is accurate and complete Lyndal Pulley, DO

## 2020-12-08 ENCOUNTER — Ambulatory Visit (INDEPENDENT_AMBULATORY_CARE_PROVIDER_SITE_OTHER): Payer: Federal, State, Local not specified - PPO

## 2020-12-08 ENCOUNTER — Other Ambulatory Visit: Payer: Self-pay

## 2020-12-08 ENCOUNTER — Ambulatory Visit: Payer: Federal, State, Local not specified - PPO | Admitting: Family Medicine

## 2020-12-08 ENCOUNTER — Ambulatory Visit: Payer: Self-pay

## 2020-12-08 ENCOUNTER — Encounter: Payer: Self-pay | Admitting: Family Medicine

## 2020-12-08 VITALS — BP 110/84 | HR 56 | Ht 72.0 in | Wt 204.0 lb

## 2020-12-08 DIAGNOSIS — I2583 Coronary atherosclerosis due to lipid rich plaque: Secondary | ICD-10-CM

## 2020-12-08 DIAGNOSIS — M25512 Pain in left shoulder: Secondary | ICD-10-CM | POA: Diagnosis not present

## 2020-12-08 DIAGNOSIS — I2510 Atherosclerotic heart disease of native coronary artery without angina pectoris: Secondary | ICD-10-CM

## 2020-12-08 DIAGNOSIS — G8929 Other chronic pain: Secondary | ICD-10-CM

## 2020-12-08 DIAGNOSIS — M25511 Pain in right shoulder: Secondary | ICD-10-CM | POA: Diagnosis not present

## 2020-12-08 DIAGNOSIS — M75102 Unspecified rotator cuff tear or rupture of left shoulder, not specified as traumatic: Secondary | ICD-10-CM | POA: Diagnosis not present

## 2020-12-08 NOTE — Patient Instructions (Signed)
Xray today Exercises Pennsaid 2x a day Ice 20 min a day Hands in peripheral vision See me in 5-6 weeks, if not better will get MRI

## 2020-12-08 NOTE — Assessment & Plan Note (Signed)
Patient was doing significant amount of repetitive yard work that likely contributed to patient getting this.  Discussed with patient about icing regimen and home exercises.  Discussed which activities to do we discussed also about the possibility of an MRI now secondary to the potential for the retraction.  Patient declined it at the moment.  Would like to try the conservative therapy.  If no significant improvement or worsening weakness I will encourage patient to have an MR arthrogram after follow-up patient knows also that he can call with any significant worsening occurs.

## 2020-12-22 ENCOUNTER — Encounter: Payer: Self-pay | Admitting: Internal Medicine

## 2020-12-22 ENCOUNTER — Ambulatory Visit: Payer: Federal, State, Local not specified - PPO | Admitting: Internal Medicine

## 2020-12-22 ENCOUNTER — Other Ambulatory Visit: Payer: Self-pay

## 2020-12-22 VITALS — BP 128/70 | HR 92 | Ht 72.0 in | Wt 208.0 lb

## 2020-12-22 DIAGNOSIS — E538 Deficiency of other specified B group vitamins: Secondary | ICD-10-CM | POA: Diagnosis not present

## 2020-12-22 DIAGNOSIS — Z0001 Encounter for general adult medical examination with abnormal findings: Secondary | ICD-10-CM

## 2020-12-22 DIAGNOSIS — E782 Mixed hyperlipidemia: Secondary | ICD-10-CM | POA: Diagnosis not present

## 2020-12-22 DIAGNOSIS — E559 Vitamin D deficiency, unspecified: Secondary | ICD-10-CM

## 2020-12-22 DIAGNOSIS — Z23 Encounter for immunization: Secondary | ICD-10-CM | POA: Diagnosis not present

## 2020-12-22 DIAGNOSIS — R739 Hyperglycemia, unspecified: Secondary | ICD-10-CM | POA: Diagnosis not present

## 2020-12-22 DIAGNOSIS — I10 Essential (primary) hypertension: Secondary | ICD-10-CM

## 2020-12-22 LAB — CBC WITH DIFFERENTIAL/PLATELET
Basophils Absolute: 0.1 10*3/uL (ref 0.0–0.1)
Basophils Relative: 0.6 % (ref 0.0–3.0)
Eosinophils Absolute: 0.1 10*3/uL (ref 0.0–0.7)
Eosinophils Relative: 1.1 % (ref 0.0–5.0)
HCT: 46.6 % (ref 39.0–52.0)
Hemoglobin: 15.9 g/dL (ref 13.0–17.0)
Lymphocytes Relative: 26.3 % (ref 12.0–46.0)
Lymphs Abs: 2.4 10*3/uL (ref 0.7–4.0)
MCHC: 34.1 g/dL (ref 30.0–36.0)
MCV: 92.6 fl (ref 78.0–100.0)
Monocytes Absolute: 0.8 10*3/uL (ref 0.1–1.0)
Monocytes Relative: 8.7 % (ref 3.0–12.0)
Neutro Abs: 5.8 10*3/uL (ref 1.4–7.7)
Neutrophils Relative %: 63.3 % (ref 43.0–77.0)
Platelets: 259 10*3/uL (ref 150.0–400.0)
RBC: 5.03 Mil/uL (ref 4.22–5.81)
RDW: 13.2 % (ref 11.5–15.5)
WBC: 9.1 10*3/uL (ref 4.0–10.5)

## 2020-12-22 LAB — LIPID PANEL
Cholesterol: 120 mg/dL (ref 0–200)
HDL: 44.2 mg/dL (ref >39.00)
LDL Cholesterol: 58 mg/dL (ref 0–99)
NonHDL: 76.06
Total CHOL/HDL Ratio: 3
Triglycerides: 92 mg/dL (ref 0.0–149.0)
VLDL: 18.4 mg/dL (ref 0.0–40.0)

## 2020-12-22 LAB — BASIC METABOLIC PANEL
BUN: 12 mg/dL (ref 6–23)
CO2: 22 mEq/L (ref 19–32)
Calcium: 9.6 mg/dL (ref 8.4–10.5)
Chloride: 107 mEq/L (ref 96–112)
Creatinine, Ser: 1.14 mg/dL (ref 0.40–1.50)
GFR: 67.03 mL/min (ref >60.00)
Glucose, Bld: 84 mg/dL (ref 70–99)
Potassium: 3.9 mEq/L (ref 3.5–5.1)
Sodium: 140 mEq/L (ref 135–145)

## 2020-12-22 LAB — PSA: PSA: 2.66 ng/mL (ref 0.10–4.00)

## 2020-12-22 LAB — HEPATIC FUNCTION PANEL
ALT: 39 U/L (ref 0–53)
AST: 25 U/L (ref 0–37)
Albumin: 4.5 g/dL (ref 3.5–5.2)
Alkaline Phosphatase: 92 U/L (ref 39–117)
Bilirubin, Direct: 0.2 mg/dL (ref 0.0–0.3)
Total Bilirubin: 0.9 mg/dL (ref 0.2–1.2)
Total Protein: 6.9 g/dL (ref 6.0–8.3)

## 2020-12-22 LAB — URINALYSIS, ROUTINE W REFLEX MICROSCOPIC
Bilirubin Urine: NEGATIVE
Hgb urine dipstick: NEGATIVE
Leukocytes,Ua: NEGATIVE
Nitrite: NEGATIVE
RBC / HPF: NONE SEEN (ref 0–?)
Specific Gravity, Urine: 1.025 (ref 1.000–1.030)
Total Protein, Urine: NEGATIVE
Urine Glucose: NEGATIVE
Urobilinogen, UA: 0.2 (ref 0.0–1.0)
WBC, UA: NONE SEEN (ref 0–?)
pH: 5.5 (ref 5.0–8.0)

## 2020-12-22 LAB — VITAMIN D 25 HYDROXY (VIT D DEFICIENCY, FRACTURES): VITD: 19.54 ng/mL — ABNORMAL LOW (ref 30.00–100.00)

## 2020-12-22 LAB — TSH: TSH: 1.46 u[IU]/mL (ref 0.35–5.50)

## 2020-12-22 LAB — VITAMIN B12: Vitamin B-12: 446 pg/mL (ref 211–911)

## 2020-12-22 LAB — HEMOGLOBIN A1C: Hgb A1c MFr Bld: 5.9 % (ref 4.6–6.5)

## 2020-12-22 NOTE — Progress Notes (Signed)
Patient ID: Richard [name], male   DOB: 23-Mar-1956, 65 y.o.   MRN: [id number]         Chief Complaint:: wellness exam and Annual Exam Low b12 and vit D, htn, hld       HPI:  Richard [name] is a 65 y.o. male here for wellness exam; declines pneumovax, for but prevnar 20, o/w up to date with preventive referrals and immnuzations                        Also Pt denies chest pain, increased sob or doe, wheezing, orthopnea, PND, increased LE swelling, palpitations, dizziness or syncope.   Pt denies polydipsia, polyuria, or new focal neuro s/s.   Pt denies fever, wt loss, night sweats, loss of appetite, or other constitutional symptoms No new complaints Not Taking Vit D but has occas otc  b12   Wt Readings from Last 3 Encounters:  12/22/20 208 lb (94.3 kg)  12/08/20 204 lb (92.5 kg)  11/07/20 212 lb (96.2 kg)   BP Readings from Last 3 Encounters:  12/22/20 128/70  12/08/20 110/84  11/07/20 120/70   Immunization History  Administered Date(s) Administered   Influenza,inj,Quad PF,6+ Mos 03/23/2019   Influenza-Unspecified 04/22/2018   Moderna Sars-Covid-2 Vaccination 09/14/2019, 10/15/2019, 04/18/2020   PNEUMOCOCCAL CONJUGATE-20 12/22/2020   Tdap 08/31/2014   Health Maintenance Due  Topic Date Due   PNA vac Low Risk Adult (1 of 2 - PCV13) 07/02/2019      Past Medical History:  Diagnosis Date   Alcoholism in remission ([place]) 08/25/2011   Allergy    seasonal   CAD (coronary artery disease) 08/25/2011   Depression    Gout    Hyperlipidemia    Hypertension    OSA on CPAP 08/21/2018   Mild OSA at 11/hr now on CPAP at 11cm H2O.    Sleep apnea    did use cpap about 10 years ago no use now   Stroke [facility])    Past Surgical History:  Procedure Laterality Date   COLONOSCOPY  10-22-11   3 polyps   FINGER NEUROPLASTY     left index finger   left achillies  2003   POLYPECTOMY      reports that he has never smoked. He has never used smokeless tobacco. He reports that he does not drink alcohol  and does not use drugs. family history includes Diabetes in his mother; Heart attack in his brother; Hypertension in his mother. Allergies  Allergen Reactions   Penicillins Other (See Comments)    Siblings are allergic Has patient had a PCN reaction causing immediate rash, facial/tongue/throat swelling, SOB or lightheadedness with hypotension: Unknown Has patient had a PCN reaction causing severe rash involving mucus membranes or skin necrosis: Unknown Has patient had a PCN reaction that required hospitalization: Unknown Has patient had a PCN reaction occurring within the last 10 years: Unknown If all of the above answers are "NO", then may proceed with Cephalosporin use. Told to avoid by parents   Current Outpatient Medications on File Prior to Visit  Medication Sig Dispense Refill   atorvastatin (LIPITOR) 80 MG tablet TAKE 1 TABLET BY MOUTH EVERY DAY 90 tablet 3   diltiazem (CARDIZEM CD) 240 MG 24 hr capsule TAKE 1 CAPSULE BY MOUTH EVERY DAY 90 capsule 3   ELIQUIS 5 MG TABS tablet TAKE 1 TABLET BY MOUTH TWICE A DAY 180 tablet 1   ezetimibe (ZETIA) 10 MG tablet TAKE 1 TABLET  BY MOUTH EVERY DAY 90 tablet 3   lisinopril (ZESTRIL) 5 MG tablet TAKE 1 TABLET BY MOUTH EVERY DAY 90 tablet 3   metoprolol succinate (TOPROL-XL) 50 MG 24 hr tablet Take 1 tablet (50 mg total) by mouth daily. Take with or immediately following a meal. 90 tablet 3   No current facility-administered medications on file prior to visit.        ROS:  All others reviewed and negative.  Objective        PE:  BP 128/70 (BP Location: Right Arm, Patient Position: Sitting, Cuff Size: Large)   Pulse 92   Ht 6' (1.829 m)   Wt 208 lb (94.3 kg)   SpO2 97%   BMI 28.21 kg/m                 Constitutional: Pt appears in NAD               HENT: Head: NCAT.                Right Ear: External ear normal.                 Left Ear: External ear normal.                Eyes: . Pupils are equal, round, and reactive to light.  Conjunctivae and EOM are normal               Nose: without d/c or deformity               Neck: Neck supple. Gross normal ROM               Cardiovascular: Normal rate and regular rhythm.                 Pulmonary/Chest: Effort normal and breath sounds without rales or wheezing.                Abd:  Soft, NT, ND, + BS, no organomegaly               Neurological: Pt is alert. At baseline orientation, motor grossly intact               Skin: Skin is warm. No rashes, no other new lesions, LE edema - none               Psychiatric: Pt behavior is normal without agitation   Micro: none  Cardiac tracings I have personally interpreted today:  none  Pertinent Radiological findings (summarize): none   Lab Results  Component Value Date   WBC 9.1 12/22/2020   HGB 15.9 12/22/2020   HCT 46.6 12/22/2020   PLT 259.0 12/22/2020   GLUCOSE 84 12/22/2020   CHOL 120 12/22/2020   TRIG 92.0 12/22/2020   HDL 44.20 12/22/2020   LDLDIRECT 146.2 08/30/2011   LDLCALC 58 12/22/2020   ALT 39 12/22/2020   AST 25 12/22/2020   NA 140 12/22/2020   K 3.9 12/22/2020   CL 107 12/22/2020   CREATININE 1.14 12/22/2020   BUN 12 12/22/2020   CO2 22 12/22/2020   TSH 1.46 12/22/2020   PSA 2.66 12/22/2020   INR 0.98 01/05/2018   HGBA1C 5.9 12/22/2020   Assessment/Plan:  Richard [name] is a 65 y.o. White or Caucasian [1] male with  has a past medical history of Alcoholism in remission ([place]) (08/25/2011), Allergy, CAD (coronary artery disease) (08/25/2011), Depression, Gout, Hyperlipidemia, Hypertension, OSA on CPAP (08/21/2018),  Sleep apnea, and Stroke ([place]).  Vitamin D deficiency Last vitamin D Lab Results  Component Value Date   VD25OH 18.65 (L) 12/17/2019   Low to start oral replacement   Hyperglycemia Lab Results  Component Value Date   HGBA1C 5.9 12/22/2020   Stable, pt to continue current medical treatment  - diet   B12 deficiency Lab Results  Component Value Date   VITAMINB12 446 12/22/2020    Stable, cont oral replacement - b12 1000 mcg qd   Encounter for well adult exam with abnormal findings Age and sex appropriate education and counseling updated with regular exercise and diet Referrals for preventative services - none needed Immunizations addressed - for prevnar 20 Smoking counseling  - none needed Evidence for depression or other mood disorder - none significant Most recent labs reviewed. I have personally reviewed and have noted: 1) the patient's medical and social history 2) The patient's current medications and supplements 3) The patient's height, weight, and BMI have been recorded in the chart   Hyperlipidemia Lab Results  Component Value Date   LDLCALC 58 12/22/2020   Stable, pt to continue current statin liptior 80   Hypertension BP Readings from Last 3 Encounters:  12/22/20 128/70  12/08/20 110/84  11/07/20 120/70   Stable, pt to continue medical treatment cardizem, lisinopril  Followup: No follow-ups on file.  Cathlean Cower, MD 12/22/2020 [facility]

## 2020-12-22 NOTE — Patient Instructions (Signed)
You had the Prevnar 20 pneumonia shot today  Please continue all other medications as before, and refills have been done if requested.  Please have the pharmacy call with any other refills you may need.  Please continue your efforts at being more active, low cholesterol diet, and weight control.  You are otherwise up to date with prevention measures today.  Please keep your appointments with your specialists as you may have planned  Please go to the LAB at the blood drawing area for the tests to be done  You will be contacted by phone if any changes need to be made immediately.  Otherwise, you will receive a letter about your results with an explanation, but please check with MyChart first.  Please remember to sign up for MyChart if you have not done so, as this will be important to you in the future with finding out test results, communicating by private email, and scheduling acute appointments online when needed.  Please make an Appointment to return for your 1 year visit, or sooner if needed

## 2020-12-22 NOTE — Assessment & Plan Note (Addendum)
Lab Results  Component Value Date   HGBA1C 5.9 12/22/2020   Stable, pt to continue current medical treatment  - diet

## 2020-12-22 NOTE — Assessment & Plan Note (Signed)
Age and sex appropriate education and counseling updated with regular exercise and diet Referrals for preventative services - none needed Immunizations addressed - for prevnar 20 Smoking counseling  - none needed Evidence for depression or other mood disorder - none significant Most recent labs reviewed. I have personally reviewed and have noted: 1) the patient's medical and social history 2) The patient's current medications and supplements 3) The patient's height, weight, and BMI have been recorded in the chart

## 2020-12-22 NOTE — Assessment & Plan Note (Signed)
Lab Results  Component Value Date   VITAMINB12 446 12/22/2020   Stable, cont oral replacement - b12 1000 mcg qd  

## 2020-12-22 NOTE — Assessment & Plan Note (Signed)
BP Readings from Last 3 Encounters:  12/22/20 128/70  12/08/20 110/84  11/07/20 120/70   Stable, pt to continue medical treatment cardizem, lisinopril

## 2020-12-22 NOTE — Assessment & Plan Note (Signed)
Lab Results  Component Value Date   LDLCALC 58 12/22/2020   Stable, pt to continue current statin liptior 80

## 2020-12-22 NOTE — Assessment & Plan Note (Signed)
Last vitamin D Lab Results  Component Value Date   VD25OH 18.65 (L) 12/17/2019   Low to start oral replacement

## 2021-01-03 ENCOUNTER — Other Ambulatory Visit: Payer: Self-pay | Admitting: Cardiology

## 2021-01-26 ENCOUNTER — Ambulatory Visit: Payer: Federal, State, Local not specified - PPO | Admitting: Family Medicine

## 2021-01-26 NOTE — Progress Notes (Signed)
[name] [place] Phone: [phone] Subjective:   Richard [name], am serving as a scribe for Dr. Hulan Saas.  I'm seeing this patient by the request  of:  Biagio Borg, MD  CC: Shoulder pain follow-up  [id number]  12/08/2020 Patient was doing significant amount of repetitive yard work that likely contributed to patient getting this.  Discussed with patient about icing regimen and home exercises.  Discussed which activities to do we discussed also about the possibility of an MRI now secondary to the potential for the retraction.  Patient declined it at the moment.  Would like to try the conservative therapy.  If no significant improvement or worsening weakness I will encourage patient to have an MR arthrogram after follow-up patient knows also that he can call with any significant worsening occurs.  Update 01/30/2021 Richard [name] is a 65 y.o. male coming in with complaint of L shoulder pain. States that the exercises have helped patient. The first 10 days were rough but almost over night the pain has gotten better, can even sleep on his L side.  Patient states that overall feeling about 80 to 90% better.  Still quick movement seems to give him some discomfort but nothing that really truly stops him anymore.  Xray L shoulder 12/08/2020 IMPRESSION: No acute osseous findings. Mild acromioclavicular degenerative changes.     Past Medical History:  Diagnosis Date   Alcoholism in remission ([place]) 08/25/2011   Allergy    seasonal   CAD (coronary artery disease) 08/25/2011   Depression    Gout    Hyperlipidemia    Hypertension    OSA on CPAP 08/21/2018   Mild OSA at 11/hr now on CPAP at 11cm H2O.    Sleep apnea    did use cpap about 10 years ago no use now   Stroke [facility])    Past Surgical History:  Procedure Laterality Date   COLONOSCOPY  10-22-11   3 polyps   FINGER NEUROPLASTY     left index finger   left  achillies  2003   POLYPECTOMY     Social History   Socioeconomic History   Marital status: Married    Spouse name: Not on file   Number of children: 2   Years of education: Not on file   Highest education level: Not on file  Occupational History   Occupation: Teacher, adult education    Comment: [employer]  Tobacco Use   Smoking status: Never   Smokeless tobacco: Never  Vaping Use   Vaping Use: Never used  Substance and Sexual Activity   Alcohol use: No   Drug use: No   Sexual activity: Not on file  Other Topics Concern   Not on file  Social History Narrative   Not on file   Social Determinants of Health   Financial Resource Strain: Not on file  Food Insecurity: Not on file  Transportation Needs: Not on file  Physical Activity: Not on file  Stress: Not on file  Social Connections: Not on file   Allergies  Allergen Reactions   Penicillins Other (See Comments)    Siblings are allergic Has patient had a PCN reaction causing immediate rash, facial/tongue/throat swelling, SOB or lightheadedness with hypotension: Unknown Has patient had a PCN reaction causing severe rash involving mucus membranes or skin necrosis: Unknown Has patient had a PCN reaction that required hospitalization: Unknown Has patient had a PCN reaction occurring within the  last 10 years: Unknown If all of the above answers are "NO", then may proceed with Cephalosporin use. Told to avoid by parents   Family History  Problem Relation Age of Onset   Hypertension Mother    Diabetes Mother    Heart attack Brother    Colon cancer Neg Hx    Stomach cancer Neg Hx    Rectal cancer Neg Hx    Esophageal cancer Neg Hx      Current Outpatient Medications (Cardiovascular):    atorvastatin (LIPITOR) 80 MG tablet, TAKE 1 TABLET BY MOUTH EVERY DAY   diltiazem (CARDIZEM CD) 240 MG 24 hr capsule, TAKE 1 CAPSULE BY MOUTH EVERY DAY   ezetimibe (ZETIA) 10 MG tablet, TAKE 1 TABLET BY MOUTH EVERY DAY   lisinopril (ZESTRIL) 5 MG  tablet, TAKE 1 TABLET BY MOUTH EVERY DAY   metoprolol succinate (TOPROL-XL) 50 MG 24 hr tablet, Take 1 tablet (50 mg total) by mouth daily. Take with or immediately following a meal.    Current Outpatient Medications (Hematological):    ELIQUIS 5 MG TABS tablet, TAKE 1 TABLET BY MOUTH TWICE A DAY    Reviewed prior external information including notes and imaging from  primary care provider As well as notes that were available from care everywhere and other healthcare systems.  Past medical history, social, surgical and family history all reviewed in electronic medical record.  No pertanent information unless stated regarding to the chief complaint.   Review of Systems:  No headache, visual changes, nausea, vomiting, diarrhea, constipation, dizziness, abdominal pain, skin rash, fevers, chills, night sweats, weight loss, swollen lymph nodes, body aches, joint swelling, chest pain, shortness of breath, mood changes.   Objective  Blood pressure 120/82, pulse 63, height 6' (1.829 m), weight 212 lb (96.2 kg), SpO2 99 %.   General: No apparent distress alert and oriented x3 mood and affect normal, dressed appropriately.  HEENT: Pupils equal, extraocular movements intact  Respiratory: Patient's speak in full sentences and does not appear short of breath  Cardiovascular: No lower extremity edema, non tender, no erythema  Gait normal with good balance and coordination.  MSK:   Left shoulder exam shows the patient does have positive impingement noted.  Mild positive crossover.  Rotator cuff strength seems to be intact.   Impression and Recommendations:     The above documentation has been reviewed and is accurate and complete Lyndal Pulley, DO

## 2021-01-30 ENCOUNTER — Ambulatory Visit: Payer: Federal, State, Local not specified - PPO | Admitting: Family Medicine

## 2021-01-30 ENCOUNTER — Other Ambulatory Visit: Payer: Self-pay

## 2021-01-30 DIAGNOSIS — M75102 Unspecified rotator cuff tear or rupture of left shoulder, not specified as traumatic: Secondary | ICD-10-CM

## 2021-01-30 NOTE — Assessment & Plan Note (Signed)
Patient is responding fairly well to conservative therapy.  Discussed with patient that we could consider the possibility of advanced imaging but feels like he is making improvement.  Strength has improved as well.  We discussed the possibility of injection if necessary but once again doing better with conservative therapy and follow-up with me again 2 months

## 2021-01-30 NOTE — Patient Instructions (Addendum)
Keep hands in peripheral vision See me 5-6 weeks

## 2021-03-10 NOTE — Progress Notes (Signed)
[place] [address] [place] Phone: [phone] Subjective:   IVilma Meckel, am serving as a scribe for Dr. Hulan Saas. This visit occurred during the SARS-CoV-2 public health emergency.  Safety protocols were in place, including screening questions prior to the visit, additional usage of staff PPE, and extensive cleaning of exam room while observing appropriate contact time as indicated for disinfecting solutions.   I'm seeing this patient by the request  of:  Biagio Borg, MD  CC: Left shoulder pain  [id number]  01/30/2021 Patient is responding fairly well to conservative therapy.  Discussed with patient that we could consider the possibility of advanced imaging but feels like he is making improvement.  Strength has improved as well.  We discussed the possibility of injection if necessary but once again doing better with conservative therapy and follow-up with me again 2 months  Update 03/13/2021 IZIAH CATES is a 65 y.o. male coming in with complaint of L shoulder pain. Patient states overall shoulder pain is getting progressively better. Has seen a lot of improvement over the last week. Internal and external rotation has improved, but abduction is still painful. Location of the pain now feels like it going down the arm instead of staying in the joint.      Past Medical History:  Diagnosis Date   Alcoholism in remission ([place]) 08/25/2011   Allergy    seasonal   CAD (coronary artery disease) 08/25/2011   Depression    Gout    Hyperlipidemia    Hypertension    OSA on CPAP 08/21/2018   Mild OSA at 11/hr now on CPAP at 11cm H2O.    Sleep apnea    did use cpap about 10 years ago no use now   Stroke [facility])    Past Surgical History:  Procedure Laterality Date   COLONOSCOPY  10-22-11   3 polyps   FINGER NEUROPLASTY     left index finger   left achillies  2003   POLYPECTOMY     Social History   Socioeconomic History    Marital status: Married    Spouse name: Not on file   Number of children: 2   Years of education: Not on file   Highest education level: Not on file  Occupational History   Occupation: Teacher, adult education    Comment: [employer]  Tobacco Use   Smoking status: Never   Smokeless tobacco: Never  Vaping Use   Vaping Use: Never used  Substance and Sexual Activity   Alcohol use: No   Drug use: No   Sexual activity: Not on file  Other Topics Concern   Not on file  Social History Narrative   Not on file   Social Determinants of Health   Financial Resource Strain: Not on file  Food Insecurity: Not on file  Transportation Needs: Not on file  Physical Activity: Not on file  Stress: Not on file  Social Connections: Not on file   Allergies  Allergen Reactions   Penicillins Other (See Comments)    Siblings are allergic Has patient had a PCN reaction causing immediate rash, facial/tongue/throat swelling, SOB or lightheadedness with hypotension: Unknown Has patient had a PCN reaction causing severe rash involving mucus membranes or skin necrosis: Unknown Has patient had a PCN reaction that required hospitalization: Unknown Has patient had a PCN reaction occurring within the last 10 years: Unknown If all of the above answers are "NO", then may proceed with  Cephalosporin use. Told to avoid by parents   Family History  Problem Relation Age of Onset   Hypertension Mother    Diabetes Mother    Heart attack Brother    Colon cancer Neg Hx    Stomach cancer Neg Hx    Rectal cancer Neg Hx    Esophageal cancer Neg Hx      Current Outpatient Medications (Cardiovascular):    atorvastatin (LIPITOR) 80 MG tablet, TAKE 1 TABLET BY MOUTH EVERY DAY   diltiazem (CARDIZEM CD) 240 MG 24 hr capsule, TAKE 1 CAPSULE BY MOUTH EVERY DAY   ezetimibe (ZETIA) 10 MG tablet, TAKE 1 TABLET BY MOUTH EVERY DAY   lisinopril (ZESTRIL) 5 MG tablet, TAKE 1 TABLET BY MOUTH EVERY DAY   metoprolol succinate (TOPROL-XL) 50  MG 24 hr tablet, Take 1 tablet (50 mg total) by mouth daily. Take with or immediately following a meal.    Current Outpatient Medications (Hematological):    ELIQUIS 5 MG TABS tablet, TAKE 1 TABLET BY MOUTH TWICE A DAY    Reviewed prior external information including notes and imaging from  primary care provider As well as notes that were available from care everywhere and other healthcare systems.  Past medical history, social, surgical and family history all reviewed in electronic medical record.  No pertanent information unless stated regarding to the chief complaint.   Review of Systems:  No headache, visual changes, nausea, vomiting, diarrhea, constipation, dizziness, abdominal pain, skin rash, fevers, chills, night sweats, weight loss, swollen lymph nodes, body aches, joint swelling, chest pain, shortness of breath, mood changes. POSITIVE muscle aches  Objective  Blood pressure 138/74, pulse 68, height 6' (1.829 m), weight 208 lb (94.3 kg), SpO2 98 %.   General: No apparent distress alert and oriented x3 mood and affect normal, dressed appropriately.  HEENT: Pupils equal, extraocular movements intact  Respiratory: Patient's speak in full sentences and does not appear short of breath  Cardiovascular: No lower extremity edema, non tender, no erythema  Gait normal with good balance and coordination.  MSK: Left shoulder exam shows the patient does have some limited range of motion in all planes.  The patient's rotator cuff 4 out of 5 strength compared to the contralateral side.  Mild positive impingement noted.  Limited muscular skeletal ultrasound was performed and interpreted by Hulan Saas, M  Limited ultrasound of patient's right shoulder still shows the patient does have a fairly large tear noted.  Seems to be mostly of the supraspinatus.  Hypoechoic changes also noted that seems to be more of a reactive subacromial bursitis.  Patient does have hypoechoic changes seen within  the bicep tendon as well as concern for possible anterior labral pathology.  Patient does have moderate narrowing of the acromioclavicular joint as well. Impression: Rotator cuff tear with acromioclavicular arthritis and questionable labral pathology    Impression and Recommendations:    The above documentation has been reviewed and is accurate and complete Lyndal Pulley, DO

## 2021-03-13 ENCOUNTER — Encounter: Payer: Self-pay | Admitting: Family Medicine

## 2021-03-13 ENCOUNTER — Ambulatory Visit: Payer: Self-pay

## 2021-03-13 ENCOUNTER — Ambulatory Visit: Payer: Federal, State, Local not specified - PPO | Admitting: Family Medicine

## 2021-03-13 ENCOUNTER — Other Ambulatory Visit: Payer: Self-pay

## 2021-03-13 VITALS — BP 138/74 | HR 68 | Ht 72.0 in | Wt 208.0 lb

## 2021-03-13 DIAGNOSIS — M75102 Unspecified rotator cuff tear or rupture of left shoulder, not specified as traumatic: Secondary | ICD-10-CM

## 2021-03-13 NOTE — Patient Instructions (Addendum)
[place] [phone] Call Today  When we receive your results we will contact you.  MyChart after MRI to discuss next steps

## 2021-03-13 NOTE — Assessment & Plan Note (Signed)
Patient still having difficulty.  Discussed and noted with patient being on the blood thinner and different treatment options.  Patient would like to know what is going on.  Only approximately 20 to 40% better and if anything I do not see that following the ultrasound.  I do feel an MR arthrogram is necessary to further evaluate the labral pathology and depending on findings patient could be a candidate for surgical intervention.  Patient is in agreement with the plan and will follow-up after imaging to discuss further.  Has failed all other conservative therapy including home exercise, anti-inflammatories, topical anti-inflammatories, and physical therapy.

## 2021-03-14 ENCOUNTER — Ambulatory Visit
Admission: RE | Admit: 2021-03-14 | Discharge: 2021-03-14 | Disposition: A | Discharge status: Home / Self Care | Payer: Federal, State, Local not specified - PPO | Source: Ambulatory Visit | Attending: Family Medicine | Admitting: Family Medicine

## 2021-03-14 DIAGNOSIS — M75102 Unspecified rotator cuff tear or rupture of left shoulder, not specified as traumatic: Secondary | ICD-10-CM

## 2021-03-14 MED ORDER — IOPAMIDOL (ISOVUE-M 200) INJECTION 41%
15.0000 mL | Freq: Once | INTRAMUSCULAR | Status: AC
  Administered 2021-03-14: 15 mL via INTRA_ARTICULAR

## 2021-03-29 ENCOUNTER — Encounter: Payer: Self-pay | Admitting: Family Medicine

## 2021-04-04 NOTE — Progress Notes (Signed)
[place] Phone: [phone] Subjective:   Fontaine No, am serving as a scribe for Dr. Hulan Saas. This visit occurred during the SARS-CoV-2 public health emergency.  Safety protocols were in place, including screening questions prior to the visit, additional usage of staff PPE, and extensive cleaning of exam room while observing appropriate contact time as indicated for disinfecting solutions.   I'm seeing this patient by the request  of:  Biagio Borg, MD  CC: Left shoulder pain  [id number]  03/13/2021 Patient still having difficulty.  Discussed and noted with patient being on the blood thinner and different treatment options.  Patient would like to know what is going on.  Only approximately 20 to 40% better and if anything I do not see that following the ultrasound.  I do feel an MR arthrogram is necessary to further evaluate the labral pathology and depending on findings patient could be a candidate for surgical intervention.  Patient is in agreement with the plan and will follow-up after imaging to discuss further.  Has failed all other conservative therapy including home exercise, anti-inflammatories, topical anti-inflammatories, and physical therapy.  Updated 04/05/2021 ACEL NATZKE is a 65 y.o. male coming in with complaint of left shoulder pain.  Left shoulder sugar patient did have severe tendinosis with a high-grade partial-thickness tear in a potential full-thickness component of the supraspinatus.  No significant retraction.  Severe arthropathy noted of the acromioclavicular joint.  Patient is here for PRP today have already discussed with him the post PRP progression as well as discussed different treatment options including surgical intervention which patient wants to avoid. States that his pain had improved up until 3 days ago. Has a lab that has issues with stability of back legs. Patient tried to  catch dog 3 days ago and felt pain increase in L shoulder.        Past Medical History:  Diagnosis Date   Alcoholism in remission ([place]) 08/25/2011   Allergy    seasonal   CAD (coronary artery disease) 08/25/2011   Depression    Gout    Hyperlipidemia    Hypertension    OSA on CPAP 08/21/2018   Mild OSA at 11/hr now on CPAP at 11cm H2O.    Sleep apnea    did use cpap about 10 years ago no use now   Stroke [facility])    Past Surgical History:  Procedure Laterality Date   COLONOSCOPY  10-22-11   3 polyps   FINGER NEUROPLASTY     left index finger   left achillies  2003   POLYPECTOMY     Social History   Socioeconomic History   Marital status: Married    Spouse name: Not on file   Number of children: 2   Years of education: Not on file   Highest education level: Not on file  Occupational History   Occupation: Teacher, adult education    Comment: [employer]  Tobacco Use   Smoking status: Never   Smokeless tobacco: Never  Vaping Use   Vaping Use: Never used  Substance and Sexual Activity   Alcohol use: No   Drug use: No   Sexual activity: Not on file  Other Topics Concern   Not on file  Social History Narrative   Not on file   Social Determinants of Health   Financial Resource Strain: Not on file  Food Insecurity: Not on file  Transportation Needs: Not on  file  Physical Activity: Not on file  Stress: Not on file  Social Connections: Not on file   Allergies  Allergen Reactions   Penicillins Other (See Comments)    Siblings are allergic Has patient had a PCN reaction causing immediate rash, facial/tongue/throat swelling, SOB or lightheadedness with hypotension: Unknown Has patient had a PCN reaction causing severe rash involving mucus membranes or skin necrosis: Unknown Has patient had a PCN reaction that required hospitalization: Unknown Has patient had a PCN reaction occurring within the last 10 years: Unknown If all of the above answers are "NO", then may proceed with  Cephalosporin use. Told to avoid by parents   Family History  Problem Relation Age of Onset   Hypertension Mother    Diabetes Mother    Heart attack Brother    Colon cancer Neg Hx    Stomach cancer Neg Hx    Rectal cancer Neg Hx    Esophageal cancer Neg Hx      Current Outpatient Medications (Cardiovascular):    atorvastatin (LIPITOR) 80 MG tablet, TAKE 1 TABLET BY MOUTH EVERY DAY   diltiazem (CARDIZEM CD) 240 MG 24 hr capsule, TAKE 1 CAPSULE BY MOUTH EVERY DAY   ezetimibe (ZETIA) 10 MG tablet, TAKE 1 TABLET BY MOUTH EVERY DAY   lisinopril (ZESTRIL) 5 MG tablet, TAKE 1 TABLET BY MOUTH EVERY DAY   metoprolol succinate (TOPROL-XL) 50 MG 24 hr tablet, Take 1 tablet (50 mg total) by mouth daily. Take with or immediately following a meal.    Current Outpatient Medications (Hematological):    ELIQUIS 5 MG TABS tablet, TAKE 1 TABLET BY MOUTH TWICE A DAY   Objective  Blood pressure 122/80, pulse 65, height 6' (1.829 m), SpO2 97 %.   General: No apparent distress alert and oriented x3 mood and affect normal, dressed appropriately.  Procedure: Real-time Ultrasound Guided Injection of left glenohumeral joint Device: GE Logiq E  Ultrasound guided injection is preferred based studies that show increased duration, increased effect, greater accuracy, decreased procedural pain, increased response rate with ultrasound guided versus blind injection.  Verbal informed consent obtained.  Time-out conducted.  Noted no overlying erythema, induration, or other signs of local infection.  Skin prepped in a sterile fashion.  Local anesthesia: Topical Ethyl chloride.  With sterile technique and under real time ultrasound guidance:  Joint visualized.  21g 2 inch needle inserted lateral approach. Pictures taken for needle placement. Patient did have injection of 2 cc of 0.5% Marcaine, then injected 5 cc of 3 centrifuge PRP Completed without difficulty  Pain immediately resolved suggesting accurate  placement of the medication.  Advised to call if fevers/chills, erythema, induration, drainage, or persistent bleeding.   Impression: Technically successful ultrasound guided injection.    Impression and Recommendations:     The above documentation has been reviewed and is accurate and complete Lyndal Pulley, DO

## 2021-04-05 ENCOUNTER — Other Ambulatory Visit: Payer: Self-pay

## 2021-04-05 ENCOUNTER — Ambulatory Visit (INDEPENDENT_AMBULATORY_CARE_PROVIDER_SITE_OTHER): Payer: Self-pay | Admitting: Family Medicine

## 2021-04-05 ENCOUNTER — Encounter: Payer: Self-pay | Admitting: Family Medicine

## 2021-04-05 ENCOUNTER — Ambulatory Visit: Payer: Self-pay

## 2021-04-05 VITALS — BP 122/80 | HR 65 | Ht 72.0 in

## 2021-04-05 DIAGNOSIS — M75102 Unspecified rotator cuff tear or rupture of left shoulder, not specified as traumatic: Secondary | ICD-10-CM

## 2021-04-05 DIAGNOSIS — G8929 Other chronic pain: Secondary | ICD-10-CM

## 2021-04-05 DIAGNOSIS — M25512 Pain in left shoulder: Secondary | ICD-10-CM

## 2021-04-05 NOTE — Assessment & Plan Note (Signed)
Patient responded well to the injection.  Discussed icing regimen and home exercises.  Discussed which activities to avoid.  Patient is going to do the PRP postinjection protocol.  Follow-up with me again 4 to 6 weeks to further evaluate.

## 2021-04-05 NOTE — Patient Instructions (Signed)
PRP injection today No ice or IBU for 3 days See me again in

## 2021-04-06 ENCOUNTER — Ambulatory Visit: Payer: Federal, State, Local not specified - PPO | Admitting: Family Medicine

## 2021-04-13 ENCOUNTER — Other Ambulatory Visit: Payer: Self-pay | Admitting: Cardiology

## 2021-04-13 NOTE — Telephone Encounter (Addendum)
Pt last saw Dr Marlou Porch 11/07/20, last labs 12/22/20 Creat 1.14, age 65, weight 94.3kg, based on specified criteria pt is on appropriate dosage of Eliquis 5mg  BID for afib.  Will refill rx.

## 2021-05-08 NOTE — Progress Notes (Signed)
[place] Phone: [phone] Subjective:   Richard [name], am serving as a scribe for Dr. Hulan Saas.  This visit occurred during the SARS-CoV-2 public health emergency.  Safety protocols were in place, including screening questions prior to the visit, additional usage of staff PPE, and extensive cleaning of exam room while observing appropriate contact time as indicated for disinfecting solutions.   I'm seeing this patient by the request  of:  Biagio Borg, MD  CC: Left shoulder pain follow-up  [id number]  04/05/2021 Patient responded well to the injection.  Discussed icing regimen and home exercises.  Discussed which activities to avoid.  Patient is going to do the PRP postinjection protocol.  Follow-up with me again 4 to 6 weeks to further evaluate.  Updated 05/09/2021 Richard [name] is a 65 y.o. male coming in with complaint of left shoulder pain.  He has what appears to be a high-grade partial-thickness versus full-thickness tear of the supraspinatus.  Patient did have PRP done 6 weeks ago.  Patient states that he is feeling better. Continues to have pain with flexion and horizontal abduction.      Past Medical History:  Diagnosis Date   Alcoholism in remission ([place]) 08/25/2011   Allergy    seasonal   CAD (coronary artery disease) 08/25/2011   Depression    Gout    Hyperlipidemia    Hypertension    OSA on CPAP 08/21/2018   Mild OSA at 11/hr now on CPAP at 11cm H2O.    Sleep apnea    did use cpap about 10 years ago [name] use now   Stroke [facility])    Past Surgical History:  Procedure Laterality Date   COLONOSCOPY  10-22-11   3 polyps   FINGER NEUROPLASTY     left index finger   left achillies  2003   POLYPECTOMY     Social History   Socioeconomic History   Marital status: Married    Spouse name: Not on file   Number of children: 2   Years of education: Not on file   Highest education level:  Not on file  Occupational History   Occupation: Teacher, adult education    Comment: [employer]  Tobacco Use   Smoking status: Never   Smokeless tobacco: Never  Vaping Use   Vaping Use: Never used  Substance and Sexual Activity   Alcohol use: [name]   Drug use: [name]   Sexual activity: Not on file  Other Topics Concern   Not on file  Social History Narrative   Not on file   Social Determinants of Health   Financial Resource Strain: Not on file  Food Insecurity: Not on file  Transportation Needs: Not on file  Physical Activity: Not on file  Stress: Not on file  Social Connections: Not on file   Allergies  Allergen Reactions   Penicillins Other (See Comments)    Siblings are allergic Has patient had a PCN reaction causing immediate rash, facial/tongue/throat swelling, SOB or lightheadedness with hypotension: Unknown Has patient had a PCN reaction causing severe rash involving mucus membranes or skin necrosis: Unknown Has patient had a PCN reaction that required hospitalization: Unknown Has patient had a PCN reaction occurring within the last 10 years: Unknown If all of the above answers are "[name]", then may proceed with Cephalosporin use. Told to avoid by parents   Family History  Problem Relation Age of Onset   Hypertension Mother  Diabetes Mother    Heart attack Brother    Colon cancer Neg Hx    Stomach cancer Neg Hx    Rectal cancer Neg Hx    Esophageal cancer Neg Hx      Current Outpatient Medications (Cardiovascular):    atorvastatin (LIPITOR) 80 MG tablet, TAKE 1 TABLET BY MOUTH EVERY DAY   diltiazem (CARDIZEM CD) 240 MG 24 hr capsule, TAKE 1 CAPSULE BY MOUTH EVERY DAY   ezetimibe (ZETIA) 10 MG tablet, TAKE 1 TABLET BY MOUTH EVERY DAY   lisinopril (ZESTRIL) 5 MG tablet, TAKE 1 TABLET BY MOUTH EVERY DAY   metoprolol succinate (TOPROL-XL) 50 MG 24 hr tablet, Take 1 tablet (50 mg total) by mouth daily. Take with or immediately following a meal.    Current Outpatient Medications  (Hematological):    ELIQUIS 5 MG TABS tablet, TAKE 1 TABLET BY MOUTH TWICE A DAY    Reviewed prior external information including notes and imaging from  primary care provider As well as notes that were available from care everywhere and other healthcare systems.  Past medical history, social, surgical and family history all reviewed in electronic medical record.  [name] pertanent information unless stated regarding to the chief complaint.   Review of Systems:  [name] headache, visual changes, nausea, vomiting, diarrhea, constipation, dizziness, abdominal pain, skin rash, fevers, chills, night sweats, weight loss, swollen lymph nodes, body aches, joint swelling, chest pain, shortness of breath, mood changes. POSITIVE muscle aches  Objective  Blood pressure 122/82, pulse (!) 59, height 6' (1.829 m), weight 207 lb (93.9 kg), SpO2 98 %.   General: [name] apparent distress alert and oriented x3 mood and affect normal, dressed appropriately.  HEENT: Pupils equal, extraocular movements intact  Respiratory: Patient's speak in full sentences and does not appear short of breath  Cardiovascular: [name] lower extremity edema, non tender, [name] erythema  Gait normal with good balance and coordination.  MSK: Left shoulder exam still shows the patient does have some weakness noted of the rotator cuff.  Patient does have some limited external rotation fairly severe.  Patient though does have improvement with internal and external strength from previous exam.  Limited muscular skeletal ultrasound was performed and interpreted by Hulan Saas, M  Limited ultrasound shows the patient's rotator cuff does have some potential new tendinopathy noted of the supraspinatus.  Mild retraction of the most anterior aspect of the supraspinatus but only 3 mm of retraction compared to 8 on the MRI previously. Impression: Interval improvement    Impression and Recommendations:     The above documentation has been reviewed and is  accurate and complete Lyndal Pulley, DO

## 2021-05-09 ENCOUNTER — Ambulatory Visit: Payer: Self-pay

## 2021-05-09 ENCOUNTER — Ambulatory Visit (INDEPENDENT_AMBULATORY_CARE_PROVIDER_SITE_OTHER): Payer: Federal, State, Local not specified - PPO | Admitting: Family Medicine

## 2021-05-09 ENCOUNTER — Encounter: Payer: Self-pay | Admitting: Family Medicine

## 2021-05-09 ENCOUNTER — Other Ambulatory Visit: Payer: Self-pay

## 2021-05-09 VITALS — BP 122/82 | HR 59 | Ht 72.0 in | Wt 207.0 lb

## 2021-05-09 DIAGNOSIS — M25512 Pain in left shoulder: Secondary | ICD-10-CM | POA: Diagnosis not present

## 2021-05-09 DIAGNOSIS — M75102 Unspecified rotator cuff tear or rupture of left shoulder, not specified as traumatic: Secondary | ICD-10-CM | POA: Diagnosis not present

## 2021-05-09 DIAGNOSIS — G8929 Other chronic pain: Secondary | ICD-10-CM | POA: Diagnosis not present

## 2021-05-09 NOTE — Patient Instructions (Signed)
Looks like it is healing Keep pushing ROM Hope strength will also improve See me again in 6 weeks to release you

## 2021-05-09 NOTE — Assessment & Plan Note (Signed)
Patient has responded fairly well to the PRP at the moment.  He still has some weakness and lack of range of motion that he needs to continue to work on.  Discussed with patient about icing regimen and home exercises.  Increase activity slowly.  There is a chance that he may need to consider a repeat of the PRP.  Patient still wants to avoid any type of surgical intervention and hopefully will be able to.  Follow-up with me again in 6 weeks

## 2021-06-15 NOTE — Progress Notes (Signed)
Richard [name] [place] Phone: [phone] Subjective:   Richard [name], am serving as a scribe for Dr. Hulan Saas. This visit occurred during the SARS-CoV-2 public health emergency.  Safety protocols were in place, including screening questions prior to the visit, additional usage of staff PPE, and extensive cleaning of exam room while observing appropriate contact time as indicated for disinfecting solutions.  I'm seeing this patient by the request  of:  Biagio Borg, MD  CC: Left shoulder pain follow-up  [id number]  05/09/2021 Patient has responded fairly well to the PRP at the moment.  Richard [name] still has some weakness and lack of range of motion that Richard [name] needs to continue to work on.  Discussed with patient about icing regimen and home exercises.  Increase activity slowly.  There is a chance that Richard [name] may need to consider a repeat of the PRP.  Patient still wants to avoid any type of surgical intervention and hopefully will be able to.  Follow-up with me again in 6 weeks  Updated 06/20/2021 Richard [name] is a 65 y.o. male coming in with complaint of left shoulder pain. Patient states that Richard [name] has less pain with reaching but is not 100%. Does still have sharp pain at end ranges. Took about 3 weeks for pain to improve after PRP.        Past Medical History:  Diagnosis Date   Alcoholism in remission ([place]) 08/25/2011   Allergy    seasonal   CAD (coronary artery disease) 08/25/2011   Depression    Gout    Hyperlipidemia    Hypertension    OSA on CPAP 08/21/2018   Mild OSA at 11/hr now on CPAP at 11cm H2O.    Sleep apnea    did use cpap about 10 years ago [name] use now   Stroke [facility])    Past Surgical History:  Procedure Laterality Date   COLONOSCOPY  10-22-11   3 polyps   FINGER NEUROPLASTY     left index finger   left achillies  2003   POLYPECTOMY     Social History   Socioeconomic History   Marital status: Married     Spouse name: Not on file   Number of children: 2   Years of education: Not on file   Highest education level: Not on file  Occupational History   Occupation: Teacher, adult education    Comment: [employer]  Tobacco Use   Smoking status: Never   Smokeless tobacco: Never  Vaping Use   Vaping Use: Never used  Substance and Sexual Activity   Alcohol use: [name]   Drug use: [name]   Sexual activity: Not on file  Other Topics Concern   Not on file  Social History Narrative   Not on file   Social Determinants of Health   Financial Resource Strain: Not on file  Food Insecurity: Not on file  Transportation Needs: Not on file  Physical Activity: Not on file  Stress: Not on file  Social Connections: Not on file   Allergies  Allergen Reactions   Penicillins Other (See Comments)    Siblings are allergic Has patient had a PCN reaction causing immediate rash, facial/tongue/throat swelling, SOB or lightheadedness with hypotension: Unknown Has patient had a PCN reaction causing severe rash involving mucus membranes or skin necrosis: Unknown Has patient had a PCN reaction that required hospitalization: Unknown Has patient had a PCN reaction occurring within the last 10 years:  Unknown If all of the above answers are "[name]", then may proceed with Cephalosporin use. Told to avoid by parents   Family History  Problem Relation Age of Onset   Hypertension Mother    Diabetes Mother    Heart attack Brother    Colon cancer Neg Hx    Stomach cancer Neg Hx    Rectal cancer Neg Hx    Esophageal cancer Neg Hx      Current Outpatient Medications (Cardiovascular):    atorvastatin (LIPITOR) 80 MG tablet, TAKE 1 TABLET BY MOUTH EVERY DAY   diltiazem (CARDIZEM CD) 240 MG 24 hr capsule, TAKE 1 CAPSULE BY MOUTH EVERY DAY   ezetimibe (ZETIA) 10 MG tablet, TAKE 1 TABLET BY MOUTH EVERY DAY   lisinopril (ZESTRIL) 5 MG tablet, TAKE 1 TABLET BY MOUTH EVERY DAY   metoprolol succinate (TOPROL-XL) 50 MG 24 hr tablet, Take 1  tablet (50 mg total) by mouth daily. Take with or immediately following a meal.    Current Outpatient Medications (Hematological):    ELIQUIS 5 MG TABS tablet, TAKE 1 TABLET BY MOUTH TWICE A DAY    Review of Systems:  [name] headache, visual changes, nausea, vomiting, diarrhea, constipation, dizziness, abdominal pain, skin rash, fevers, chills, night sweats, weight loss, swollen lymph nodes, body aches, joint swelling, chest pain, shortness of breath, mood changes. POSITIVE muscle aches  Objective  Blood pressure 126/84, pulse 62, height 6' (1.829 m), weight 207 lb (93.9 kg), SpO2 99 %.   General: [name] apparent distress alert and oriented x3 mood and affect normal, dressed appropriately.  HEENT: Pupils equal, extraocular movements intact  Respiratory: Patient's speak in full sentences and does not appear short of breath  Cardiovascular: [name] lower extremity edema, non tender, [name] erythema  Gait normal with good balance and coordination.  MSK: Left shoulder exam still shows the patient does have some mild limited range of motion in all planes.  Patient does have some tenderness to palpation diffusely in the shoulder.  Rotator cuff strength 4 out of 5 compared to the contralateral side.  Limited muscular skeletal ultrasound was performed and interpreted by Hulan Saas, M  Limited ultrasound of patient's left shoulder shows the patient still has a partial rotator cuff tear noted of the supraspinatus.  Patient does have hypoechoic changes of the subscapularis consistent with tendinitis as well as what appears to be a bursitis.  Patient also has hypoechoic changes within the bicep tendon sheath noted. Impression: Rotator cuff partial tear   Impression and Recommendations:     The above documentation has been reviewed and is accurate and complete Lyndal Pulley, DO

## 2021-06-20 ENCOUNTER — Ambulatory Visit: Payer: Self-pay

## 2021-06-20 ENCOUNTER — Other Ambulatory Visit: Payer: Self-pay

## 2021-06-20 ENCOUNTER — Ambulatory Visit (INDEPENDENT_AMBULATORY_CARE_PROVIDER_SITE_OTHER): Payer: Federal, State, Local not specified - PPO | Admitting: Family Medicine

## 2021-06-20 VITALS — BP 126/84 | HR 62 | Ht 72.0 in | Wt 207.0 lb

## 2021-06-20 DIAGNOSIS — M25512 Pain in left shoulder: Secondary | ICD-10-CM | POA: Diagnosis not present

## 2021-06-20 DIAGNOSIS — G8929 Other chronic pain: Secondary | ICD-10-CM

## 2021-06-20 DIAGNOSIS — M75102 Unspecified rotator cuff tear or rupture of left shoulder, not specified as traumatic: Secondary | ICD-10-CM | POA: Diagnosis not present

## 2021-06-20 NOTE — Assessment & Plan Note (Signed)
Patient on ultrasound today still has some mild hypoechoic changes still noted.  He does have still a tear it appears it is potentially high-grade of the supraspinatus.  Patient still is not willing to have surgical intervention.  Patient does feel like he is making improvement with some strength as well as range of motion.  Patient has been able to do things such as put on his own jacket as well as sleep comfortably with no significant discomfort.  Patient would like to continue with the conservative therapy and follow-up with me again 6 to 8 weeks otherwise.

## 2021-06-20 NOTE — Patient Instructions (Signed)
Looks good overall Give it another 5 weeks, consider steroid Continue everything else

## 2021-07-13 ENCOUNTER — Other Ambulatory Visit: Payer: Self-pay | Admitting: Cardiology

## 2021-07-19 NOTE — Progress Notes (Signed)
[place] Phone: [phone] Subjective:   Fontaine No, am serving as a scribe for Dr. Hulan Saas.This visit occurred during the SARS-CoV-2 public health emergency.  Safety protocols were in place, including screening questions prior to the visit, additional usage of staff PPE, and extensive cleaning of exam room while observing appropriate contact time as indicated for disinfecting solutions.  I'm seeing this patient by the request  of:  Biagio Borg, MD  CC: Left shoulder pain  [id number]  06/20/2021 Patient on ultrasound today still has some mild hypoechoic changes still noted.  He does have still a tear it appears it is potentially high-grade of the supraspinatus.  Patient still is not willing to have surgical intervention.  Patient does feel like he is making improvement with some strength as well as range of motion.  Patient has been able to do things such as put on his own jacket as well as sleep comfortably with no significant discomfort.  Patient would like to continue with the conservative therapy and follow-up with me again 6 to 8 weeks otherwise.  Updated 07/25/2021 ELDIN BONSELL is a 66 y.o. male coming in with complaint of left shoulder pain. Patient states that he is doing better but disappointed as he is making such small improvements. Feels that he has plateaued.  Patient did have the PRP in the shoulder.  States that there are certain range of motion that continues to give him discomfort.  Patient states symptoms can be uncomfortable at night.  Symptoms mentioned across his body seems to be the worst of it.       Past Medical History:  Diagnosis Date   Alcoholism in remission ([place]) 08/25/2011   Allergy    seasonal   CAD (coronary artery disease) 08/25/2011   Depression    Gout    Hyperlipidemia    Hypertension    OSA on CPAP 08/21/2018   Mild OSA at 11/hr now on CPAP at 11cm H2O.    Sleep  apnea    did use cpap about 10 years ago no use now   Stroke [facility])    Past Surgical History:  Procedure Laterality Date   COLONOSCOPY  10-22-11   3 polyps   FINGER NEUROPLASTY     left index finger   left achillies  2003   POLYPECTOMY     Social History   Socioeconomic History   Marital status: Married    Spouse name: Not on file   Number of children: 2   Years of education: Not on file   Highest education level: Not on file  Occupational History   Occupation: Teacher, adult education    Comment: [employer]  Tobacco Use   Smoking status: Never   Smokeless tobacco: Never  Vaping Use   Vaping Use: Never used  Substance and Sexual Activity   Alcohol use: No   Drug use: No   Sexual activity: Not on file  Other Topics Concern   Not on file  Social History Narrative   Not on file   Social Determinants of Health   Financial Resource Strain: Not on file  Food Insecurity: Not on file  Transportation Needs: Not on file  Physical Activity: Not on file  Stress: Not on file  Social Connections: Not on file   Allergies  Allergen Reactions   Penicillins Other (See Comments)    Siblings are allergic Has patient had a PCN reaction causing immediate  rash, facial/tongue/throat swelling, SOB or lightheadedness with hypotension: Unknown Has patient had a PCN reaction causing severe rash involving mucus membranes or skin necrosis: Unknown Has patient had a PCN reaction that required hospitalization: Unknown Has patient had a PCN reaction occurring within the last 10 years: Unknown If all of the above answers are "NO", then may proceed with Cephalosporin use. Told to avoid by parents   Family History  Problem Relation Age of Onset   Hypertension Mother    Diabetes Mother    Heart attack Brother    Colon cancer Neg Hx    Stomach cancer Neg Hx    Rectal cancer Neg Hx    Esophageal cancer Neg Hx      Current Outpatient Medications (Cardiovascular):    atorvastatin (LIPITOR) 80 MG tablet,  TAKE 1 TABLET BY MOUTH EVERY DAY   diltiazem (CARDIZEM CD) 240 MG 24 hr capsule, TAKE 1 CAPSULE BY MOUTH EVERY DAY   ezetimibe (ZETIA) 10 MG tablet, TAKE 1 TABLET BY MOUTH EVERY DAY   lisinopril (ZESTRIL) 5 MG tablet, TAKE 1 TABLET BY MOUTH EVERY DAY   metoprolol succinate (TOPROL-XL) 50 MG 24 hr tablet, Take 1 tablet (50 mg total) by mouth daily. Take with or immediately following a meal.    Current Outpatient Medications (Hematological):    ELIQUIS 5 MG TABS tablet, TAKE 1 TABLET BY MOUTH TWICE A DAY    Reviewed prior external information including notes and imaging from  primary care provider As well as notes that were available from care everywhere and other healthcare systems.  Past medical history, social, surgical and family history all reviewed in electronic medical record.  No pertanent information unless stated regarding to the chief complaint.   Review of Systems:  No headache, visual changes, nausea, vomiting, diarrhea, constipation, dizziness, abdominal pain, skin rash, fevers, chills, night sweats, weight loss, swollen lymph nodes, body aches, joint swelling, chest pain, shortness of breath, mood changes. POSITIVE muscle aches  Objective  Blood pressure 122/82, pulse (!) 57, height 6' (1.829 m), weight 208 lb (94.3 kg), SpO2 98 %.   General: No apparent distress alert and oriented x3 mood and affect normal, dressed appropriately.  HEENT: Pupils equal, extraocular movements intact  Respiratory: Patient's speak in full sentences and does not appear short of breath  Cardiovascular: No lower extremity edema, non tender, no erythema  Left shoulder exam does show the patient still has positive impingement noted.  Positive crossover.  Tender to palpation of the acromioclavicular joint.  4+ out of 5 strength of the rotator cuff compared to the contralateral side.  Procedure: Real-time Ultrasound Guided Injection of left acromioclavicular joint Device: GE Logiq Q7 Ultrasound  guided injection is preferred based studies that show increased duration, increased effect, greater accuracy, decreased procedural pain, increased response rate, and decreased cost with ultrasound guided versus blind injection.  Verbal informed consent obtained.  Time-out conducted.  Noted no overlying erythema, induration, or other signs of local infection.  Skin prepped in a sterile fashion.  Local anesthesia: Topical Ethyl chloride.  With sterile technique and under real time ultrasound guidance: With a 25-gauge half inch needle injecting 0.5 cc of 0.5% Marcaine and 0.5 cc of Kenalog 40 mg/mL.  No blood loss.   Completed without difficulty  Pain immediately improved suggesting accurate placement of the medication.  Advised to call if fevers/chills, erythema, induration, drainage, or persistent bleeding.  Impression: Technically successful ultrasound guided injection.    Impression and Recommendations:     The  above documentation has been reviewed and is accurate and complete Lyndal Pulley, DO

## 2021-07-25 ENCOUNTER — Ambulatory Visit: Payer: Self-pay

## 2021-07-25 ENCOUNTER — Ambulatory Visit (INDEPENDENT_AMBULATORY_CARE_PROVIDER_SITE_OTHER): Payer: Federal, State, Local not specified - PPO | Admitting: Family Medicine

## 2021-07-25 ENCOUNTER — Encounter: Payer: Self-pay | Admitting: Family Medicine

## 2021-07-25 ENCOUNTER — Other Ambulatory Visit: Payer: Self-pay

## 2021-07-25 VITALS — BP 122/82 | HR 57 | Ht 72.0 in | Wt 208.0 lb

## 2021-07-25 DIAGNOSIS — G8929 Other chronic pain: Secondary | ICD-10-CM | POA: Diagnosis not present

## 2021-07-25 DIAGNOSIS — M19012 Primary osteoarthritis, left shoulder: Secondary | ICD-10-CM

## 2021-07-25 DIAGNOSIS — M25512 Pain in left shoulder: Secondary | ICD-10-CM | POA: Diagnosis not present

## 2021-07-25 DIAGNOSIS — M75102 Unspecified rotator cuff tear or rupture of left shoulder, not specified as traumatic: Secondary | ICD-10-CM

## 2021-07-25 NOTE — Assessment & Plan Note (Signed)
Patient given injection and tolerated the procedure well, discussed icing regimen and home exercises, which activities to do much.  Avoid.  Increase activity slowly.  Discussed icing regimen.  Follow-up with me again in 6 to 8 weeks.  Continuing to have pain in the shoulder do need to consider the possibility of an injection for subacromial bursitis to see how patient would respond.

## 2021-07-25 NOTE — Assessment & Plan Note (Signed)
We will need to continue to monitor.  The patient is now 4 months out since the PRP.  We need to consider the role for repeat or possible subacromial injection at follow-up.

## 2021-07-25 NOTE — Patient Instructions (Signed)
Good to see you Injected AC joint See you again in 6-8 weeks

## 2021-08-30 NOTE — Progress Notes (Signed)
?Richard Boxer D.O. ?[facility] ?[place] ?Phone: [phone] ?Subjective:   ?I, Richard [name], am serving as a scribe for Dr. Hulan Saas. ? ?This visit occurred during the SARS-CoV-2 public health emergency.  Safety protocols were in place, including screening questions prior to the visit, additional usage of staff PPE, and extensive cleaning of exam room while observing appropriate contact time as indicated for disinfecting solutions.  ?I'm seeing this patient by the request  of:  Biagio Borg, MD ? ?CC: Left shoulder pain ? ?[id number]  ?07/25/2021 ?We will need to continue to monitor.  The patient is now 4 months out since the PRP.  We need to consider the role for repeat or possible subacromial injection at follow-up. ? ?Patient given injection and tolerated the procedure well, discussed icing regimen and home exercises, which activities to do much.  Avoid.  Increase activity slowly.  Discussed icing regimen.  Follow-up with me again in 6 to 8 weeks.  Continuing to have pain in the shoulder do need to consider the possibility of an injection for subacromial bursitis to see how patient would respond. ? ?Update 09/05/2021 ?Richard [name] is a 66 y.o. male coming in with complaint of L shoulder pain.  Patient did have an injection in the left clavicular joint.  Patient states that he continues to have improvement post-PRP and steroid injection. Does still have pain with end range flexion of AC joint.  ? ? ?  ? ?Past Medical History:  ?Diagnosis Date  ? Alcoholism in remission ([place]) 08/25/2011  ? Allergy   ? seasonal  ? CAD (coronary artery disease) 08/25/2011  ? Depression   ? Gout   ? Hyperlipidemia   ? Hypertension   ? OSA on CPAP 08/21/2018  ? Mild OSA at 11/hr now on CPAP at 11cm H2O.   ? Sleep apnea   ? did use cpap about 10 years ago no use now  ? Stroke [facility])   ? ?Past Surgical History:  ?Procedure Laterality Date  ? COLONOSCOPY  10-22-11  ? 3 polyps  ? FINGER  NEUROPLASTY    ? left index finger  ? left achillies  2003  ? POLYPECTOMY    ? ?Social History  ? ?Socioeconomic History  ? Marital status: Married  ?  Spouse name: Not on file  ? Number of children: 2  ? Years of education: Not on file  ? Highest education level: Not on file  ?Occupational History  ? Occupation: Teacher, adult education  ?  Comment: [employer]  ?Tobacco Use  ? Smoking status: Never  ? Smokeless tobacco: Never  ?Vaping Use  ? Vaping Use: Never used  ?Substance and Sexual Activity  ? Alcohol use: No  ? Drug use: No  ? Sexual activity: Not on file  ?Other Topics Concern  ? Not on file  ?Social History Narrative  ? Not on file  ? ?Social Determinants of Health  ? ?Financial Resource Strain: Not on file  ?Food Insecurity: Not on file  ?Transportation Needs: Not on file  ?Physical Activity: Not on file  ?Stress: Not on file  ?Social Connections: Not on file  ? ?Allergies  ?Allergen Reactions  ? Penicillins Other (See Comments)  ?  Siblings are allergic ?Has patient had a PCN reaction causing immediate rash, facial/tongue/throat swelling, SOB or lightheadedness with hypotension: Unknown ?Has patient had a PCN reaction causing severe rash involving mucus membranes or skin necrosis: Unknown ?Has patient had a PCN reaction  that required hospitalization: Unknown ?Has patient had a PCN reaction occurring within the last 10 years: Unknown ?If all of the above answers are "NO", then may proceed with Cephalosporin use. ?Told to avoid by parents  ? ?Family History  ?Problem Relation Age of Onset  ? Hypertension Mother   ? Diabetes Mother   ? Heart attack Brother   ? Colon cancer Neg Hx   ? Stomach cancer Neg Hx   ? Rectal cancer Neg Hx   ? Esophageal cancer Neg Hx   ? ? ? ?Current Outpatient Medications (Cardiovascular):  ?  atorvastatin (LIPITOR) 80 MG tablet, TAKE 1 TABLET BY MOUTH EVERY DAY ?  diltiazem (CARDIZEM CD) 240 MG 24 hr capsule, TAKE 1 CAPSULE BY MOUTH EVERY DAY ?  ezetimibe (ZETIA) 10 MG tablet, TAKE 1 TABLET BY  MOUTH EVERY DAY ?  lisinopril (ZESTRIL) 5 MG tablet, TAKE 1 TABLET BY MOUTH EVERY DAY ?  metoprolol succinate (TOPROL-XL) 50 MG 24 hr tablet, Take 1 tablet (50 mg total) by mouth daily. Take with or immediately following a meal. ? ? ? ?Current Outpatient Medications (Hematological):  ?  ELIQUIS 5 MG TABS tablet, TAKE 1 TABLET BY MOUTH TWICE A DAY ? ? ?Objective  ?Blood pressure 110/78, pulse (!) 46, height 6' (1.829 m), weight 203 lb (92.1 kg), SpO2 98 %. ?  ?General: No apparent distress alert and oriented x3 mood and affect normal, dressed appropriately.  ?HEENT: Pupils equal, extraocular movements intact  ?Respiratory: Patient's speak in full sentences and does not appear short of breath  ?Cardiovascular: No lower extremity edema, non tender, no erythema  ?Gait normal with good balance and coordination.  ?MSK: Left shoulder exam still shows some mild positive impingement noted.  Patient does have decreased internal rotation of the shoulder compared to the contralateral side.  Rotator cuff strength 4+ out of 5. ? ?Limited muscular skeletal ultrasound was performed and interpreted by Hulan Saas, M  ?Limited ultrasound of patient's left shoulder rotator cuff still has some changes noted but no true acute changes noted.  Patient still has some hypoechoic changes of the acromioclavicular joint with underlying arthritic changes. ? ?  ?Impression and Recommendations:  ?  ?The above documentation has been reviewed and is accurate and complete Lyndal Pulley, DO ? ? ? ?

## 2021-09-05 ENCOUNTER — Encounter: Payer: Self-pay | Admitting: Family Medicine

## 2021-09-05 ENCOUNTER — Ambulatory Visit: Payer: Self-pay

## 2021-09-05 ENCOUNTER — Other Ambulatory Visit: Payer: Self-pay

## 2021-09-05 ENCOUNTER — Ambulatory Visit: Payer: Federal, State, Local not specified - PPO | Admitting: Family Medicine

## 2021-09-05 VITALS — BP 110/78 | HR 46 | Ht 72.0 in | Wt 203.0 lb

## 2021-09-05 DIAGNOSIS — M25512 Pain in left shoulder: Secondary | ICD-10-CM

## 2021-09-05 DIAGNOSIS — M75102 Unspecified rotator cuff tear or rupture of left shoulder, not specified as traumatic: Secondary | ICD-10-CM | POA: Diagnosis not present

## 2021-09-05 DIAGNOSIS — G8929 Other chronic pain: Secondary | ICD-10-CM | POA: Diagnosis not present

## 2021-09-05 NOTE — Patient Instructions (Signed)
Do have some swelling on AC joint ?Check in 8 weeks from now ?

## 2021-09-05 NOTE — Assessment & Plan Note (Addendum)
Patient overall does seem to be doing better overall.  We discussed again about the possibility of repeating PRP the patient is making good progress.  Discussed which activities to do and which ones to avoid.  Increase activity slowly.  Discussed icing regimen and home exercises.  Increase activity.  Follow-up with me again in 6 to 8 weeks patient still wants to avoid any surgical intervention. ?

## 2021-09-15 ENCOUNTER — Other Ambulatory Visit: Payer: Self-pay | Admitting: Cardiology

## 2021-09-18 ENCOUNTER — Other Ambulatory Visit: Payer: Self-pay | Admitting: Cardiology

## 2021-09-20 ENCOUNTER — Encounter: Payer: Self-pay | Admitting: Cardiology

## 2021-09-20 ENCOUNTER — Encounter: Payer: Federal, State, Local not specified - PPO | Admitting: Cardiology

## 2021-09-20 NOTE — Progress Notes (Signed)
This encounter was created in error - please disregard.

## 2021-09-21 ENCOUNTER — Telehealth: Payer: Self-pay | Admitting: *Deleted

## 2021-09-21 DIAGNOSIS — G4733 Obstructive sleep apnea (adult) (pediatric): Secondary | ICD-10-CM

## 2021-09-21 NOTE — Telephone Encounter (Signed)
Order placed to change to auto CPAP from 4 to 15cm H2O and have him follow up in 6 weeks? ?

## 2021-09-21 NOTE — Telephone Encounter (Signed)
-----   Message from Antonieta Iba, RN sent at 09/20/2021  2:28 PM EDT ----- ?Stark Bray,  ?I cancelled the patient's appointment today. Can you make sure that his settings are changed to auto CPAP from 4 to 15cm H2O and have him follow up in 6 weeks? I can schedule the appointment if you let me know when his settings have been changed.  ?Thank you! ?Carly ? ?

## 2021-09-25 ENCOUNTER — Telehealth: Payer: Self-pay | Admitting: *Deleted

## 2021-09-25 DIAGNOSIS — G4733 Obstructive sleep apnea (adult) (pediatric): Secondary | ICD-10-CM

## 2021-09-25 NOTE — Telephone Encounter (Signed)
Received orders to change patient from a set pressure of 11 cm H20 to auto CPAP from 4 to 15cm H2O. Change was made via the modem.   ? ?

## 2021-09-25 NOTE — Telephone Encounter (Signed)
-----   Message from Antonieta Iba, RN sent at 09/20/2021  2:28 PM EDT ----- ?Richard [name],  ?I cancelled the patient's appointment today. Can you make sure that his settings are changed to auto CPAP from 4 to 15cm H2O and have him follow up in 6 weeks? I can schedule the appointment if you let me know when his settings have been changed.  ?Thank you! ?Carly ? ?

## 2021-10-13 ENCOUNTER — Other Ambulatory Visit: Payer: Self-pay | Admitting: Cardiology

## 2021-10-25 ENCOUNTER — Other Ambulatory Visit: Payer: Self-pay | Admitting: Cardiology

## 2021-10-25 DIAGNOSIS — I480 Paroxysmal atrial fibrillation: Secondary | ICD-10-CM

## 2021-10-25 NOTE — Telephone Encounter (Signed)
Eliquis '5mg'$  refill request received. Patient is 66 years old, weight-91.6kg, Crea-1.14 on 12/22/2020, Diagnosis-Afib, and last seen by Dr. Marlou Porch on 11/07/2020 and pending appts in [place] and [place]. Dose is appropriate based on dosing criteria. Will send in refill to requested pharmacy.   ?

## 2021-11-06 NOTE — Progress Notes (Unsigned)
Richard [name] [address] [place] Phone: [phone] Subjective:   IVilma Meckel, am serving as a scribe for Dr. Hulan Saas. This visit occurred during the SARS-CoV-2 public health emergency.  Safety protocols were in place, including screening questions prior to the visit, additional usage of staff PPE, and extensive cleaning of exam room while observing appropriate contact time as indicated for disinfecting solutions.   I'm seeing this patient by the request  of:  Biagio Borg, MD  CC: Left shoulder pain follow-up  CHE:[id number]  09/05/2021 Patient overall does seem to be doing better overall.  We discussed again about the possibility of repeating PRP the patient is making good progress.  Discussed which activities to do and which ones to avoid.  Increase activity slowly.  Discussed icing regimen and home exercises.  Increase activity.  Follow-up with me again in 6 to 8 weeks patient still wants to avoid any surgical intervention.  Updated 11/07/2021 ERRICK [name] is a 66 y.o. male coming in with complaint of left shoulder pain. Doing really well. Has been doing exercises. Progress in the right direction.  Patient states that he is not having much pain at all in any type of movement.  Patient states he is able to do daily activities without any significant discomfort.       Past Medical History:  Diagnosis Date   Alcoholism in remission ([place]) 08/25/2011   Allergy    seasonal   CAD (coronary artery disease) 08/25/2011   Depression    Gout    Hyperlipidemia    Hypertension    OSA on CPAP 08/21/2018   Mild OSA at 11/hr now on CPAP at 11cm H2O.    Sleep apnea    did use cpap about 10 years ago no use now   Stroke [facility])    Past Surgical History:  Procedure Laterality Date   COLONOSCOPY  10-22-11   3 polyps   FINGER NEUROPLASTY     left index finger   left achillies  2003   POLYPECTOMY     Social History   Socioeconomic  History   Marital status: Married    Spouse name: Not on file   Number of children: 2   Years of education: Not on file   Highest education level: Not on file  Occupational History   Occupation: Teacher, adult education    Comment: [employer]  Tobacco Use   Smoking status: Never   Smokeless tobacco: Never  Vaping Use   Vaping Use: Never used  Substance and Sexual Activity   Alcohol use: No   Drug use: No   Sexual activity: Not on file  Other Topics Concern   Not on file  Social History Narrative   Not on file   Social Determinants of Health   Financial Resource Strain: Not on file  Food Insecurity: Not on file  Transportation Needs: Not on file  Physical Activity: Not on file  Stress: Not on file  Social Connections: Not on file   Allergies  Allergen Reactions   Penicillins Other (See Comments)    Siblings are allergic Has patient had a PCN reaction causing immediate rash, facial/tongue/throat swelling, SOB or lightheadedness with hypotension: Unknown Has patient had a PCN reaction causing severe rash involving mucus membranes or skin necrosis: Unknown Has patient had a PCN reaction that required hospitalization: Unknown Has patient had a PCN reaction occurring within the last 10 years: Unknown If all of the above  answers are "NO", then may proceed with Cephalosporin use. Told to avoid by parents   Family History  Problem Relation Age of Onset   Hypertension Mother    Diabetes Mother    Heart attack Brother    Colon cancer Neg Hx    Stomach cancer Neg Hx    Rectal cancer Neg Hx    Esophageal cancer Neg Hx      Current Outpatient Medications (Cardiovascular):    atorvastatin (LIPITOR) 80 MG tablet, TAKE 1 TABLET BY MOUTH EVERY DAY   diltiazem (CARDIZEM CD) 240 MG 24 hr capsule, Take 1 capsule (240 mg total) by mouth daily. Please keep upcoming appointment for future refills. Thank you.   ezetimibe (ZETIA) 10 MG tablet, TAKE 1 TABLET BY MOUTH EVERY DAY   lisinopril  (ZESTRIL) 5 MG tablet, Take 1 tablet (5 mg total) by mouth daily. Please keep upcoming appointment for future refills. Thank you.   metoprolol succinate (TOPROL-XL) 50 MG 24 hr tablet, Take 1 tablet (50 mg total) by mouth daily. Please keep upcoming appointment for future refills. Thank you.    Current Outpatient Medications (Hematological):    ELIQUIS 5 MG TABS tablet, TAKE 1 TABLET BY MOUTH TWICE A DAY     Objective  Blood pressure 122/78, pulse 60, height 6' (1.829 m), weight 205 lb (93 kg), SpO2 98 %.   General: No apparent distress alert and oriented x3 mood and affect normal, dressed appropriately.  HEENT: Pupils equal, extraocular movements intact  Respiratory: Patient's speak in full sentences and does not appear short of breath  Cardiovascular: No lower extremity edema, non tender, no erythema  Gait normal with good balance and coordination.  MSK: Limited range of motion noted with external range of motion of the left shoulder.  Lacks the last 5 degrees of external range of motion.  Patient does have difficulty with flexion secondary to the acromioclavicular joint.  Rotator cuff strength is 5 out of 5  Limited muscular skeletal ultrasound was performed and interpreted by Hulan Saas, M  Limited ultrasound still shows some degenerative changes of the rotator cuff noted.  Significant arthritic changes noted of the acromioclavicular joint. Impression: Arthritis noted of the shoulder with still some degenerative changes of the rotator cuff    Impression and Recommendations:     T.zend

## 2021-11-07 ENCOUNTER — Ambulatory Visit: Payer: Self-pay

## 2021-11-07 ENCOUNTER — Ambulatory Visit: Payer: Federal, State, Local not specified - PPO | Admitting: Family Medicine

## 2021-11-07 VITALS — BP 122/78 | HR 60 | Ht 72.0 in | Wt 205.0 lb

## 2021-11-07 DIAGNOSIS — M75102 Unspecified rotator cuff tear or rupture of left shoulder, not specified as traumatic: Secondary | ICD-10-CM

## 2021-11-07 DIAGNOSIS — M19012 Primary osteoarthritis, left shoulder: Secondary | ICD-10-CM

## 2021-11-07 NOTE — Assessment & Plan Note (Signed)
Patient still has some limited range of motion but wants to continue with the conservative therapy.  Patient has good strength at this moment.  We did discuss that if this continues to give him difficulty the worst case scenario would be that patient would have surgical intervention for shoulder replacement in the future.  Patient at this point states that he is not having pain and he is able to do all daily activities.  Can follow-up with me otherwise then as needed

## 2021-11-07 NOTE — Patient Instructions (Signed)
Good to see you! Overall do what you want including getting back on the greens Send me a message in 1-2 months Otherwise see me when you need me

## 2021-11-07 NOTE — Assessment & Plan Note (Signed)
Still has some arthritis but overall doing relatively well.  Do think there is some underlying arthritis in the shoulder as well we need to continue to monitor.

## 2021-11-08 ENCOUNTER — Encounter: Payer: Self-pay | Admitting: Gastroenterology

## 2021-11-27 ENCOUNTER — Encounter: Payer: Self-pay | Admitting: Cardiology

## 2021-11-27 ENCOUNTER — Ambulatory Visit: Payer: Federal, State, Local not specified - PPO | Admitting: Cardiology

## 2021-11-27 VITALS — BP 130/70 | HR 86 | Ht 72.0 in | Wt 206.0 lb

## 2021-11-27 DIAGNOSIS — I10 Essential (primary) hypertension: Secondary | ICD-10-CM

## 2021-11-27 DIAGNOSIS — G4733 Obstructive sleep apnea (adult) (pediatric): Secondary | ICD-10-CM | POA: Diagnosis not present

## 2021-11-27 DIAGNOSIS — I480 Paroxysmal atrial fibrillation: Secondary | ICD-10-CM

## 2021-11-27 NOTE — Progress Notes (Signed)
Cardiology Office Note:    Date:  11/27/2021   ID:  Richard [name], DOB August 13, 1955, MRN [id number]  PCP:  Biagio Borg, MD  Cardiologist:  Candee Furbish, MD    Referring MD: Biagio Borg, MD   Chief Complaint  Patient presents with   Sleep Apnea   Hypertension    History of Present Illness:    Richard [name] is a 66 y.o. male with a hx of ASCAD, PAF and CVA.  He was referred for sleep study which showed mild OSA with an AHI of 7.9/hr overall and moderate during REM sleep at 17.5/hr.  O2 sats dropped to 94.7%.  He underwent CPAP titration to 11cm H2O .  At last office visit his AHI was still high so he was changed to auto CPAP from 4 to 15 cm H2O.   He is doing well with his CPAP device and thinks that he has gotten used to it.  He tolerates the nasal mask but does not use a chin strap and feels the pressure is adequate.  He denies any significant mouth or nasal dryness or nasal congestion.  He does not think that he snores.  He tore a rotator cuff in his left shoulder and has had problems with throbbing pain and has limited his use of the CPAP due to tossing and turning and mask not staying in place.   Past Medical History:  Diagnosis Date   Alcoholism in remission ([place]) 08/25/2011   Allergy    seasonal   CAD (coronary artery disease) 08/25/2011   Depression    Gout    Hyperlipidemia    Hypertension    OSA on CPAP 08/21/2018   Mild OSA at 11/hr now on CPAP at 11cm H2O.    Sleep apnea    did use cpap about 10 years ago no use now   Stroke [facility])     Past Surgical History:  Procedure Laterality Date   COLONOSCOPY  10-22-11   3 polyps   FINGER NEUROPLASTY     left index finger   left achillies  2003   POLYPECTOMY      Current Medications: No outpatient medications have been marked as taking for the 11/27/21 encounter (Office Visit) with Sueanne Margarita, MD.     Allergies:   Penicillins   Social History   Socioeconomic History   Marital status: Married    Spouse name:  Not on file   Number of children: 2   Years of education: Not on file   Highest education level: Not on file  Occupational History   Occupation: Teacher, adult education    Comment: [employer]  Tobacco Use   Smoking status: Never   Smokeless tobacco: Never  Vaping Use   Vaping Use: Never used  Substance and Sexual Activity   Alcohol use: No   Drug use: No   Sexual activity: Not on file  Other Topics Concern   Not on file  Social History Narrative   Not on file   Social Determinants of Health   Financial Resource Strain: Not on file  Food Insecurity: Not on file  Transportation Needs: Not on file  Physical Activity: Not on file  Stress: Not on file  Social Connections: Not on file     Family History: The patient's family history includes Diabetes in his mother; Heart attack in his brother; Hypertension in his mother. There is no history of Colon cancer, Stomach cancer, Rectal cancer, or Esophageal cancer.  ROS:  Please see the history of present illness.    ROS  All other systems reviewed and negative.   EKGs/Labs/Other Studies Reviewed:    The following studies were reviewed today: PAP download  EKG:  EKG is not ordered today   Recent Labs: 12/22/2020: ALT 39; BUN 12; Creatinine, Ser 1.14; Hemoglobin 15.9; Platelets 259.0; Potassium 3.9; Sodium 140; TSH 1.46   Recent Lipid Panel    Component Value Date/Time   CHOL 120 12/22/2020 1425   CHOL 116 05/08/2018 1346   TRIG 92.0 12/22/2020 1425   HDL 44.20 12/22/2020 1425   HDL 43 05/08/2018 1346   CHOLHDL 3 12/22/2020 1425   VLDL 18.4 12/22/2020 1425   LDLCALC 58 12/22/2020 1425   LDLCALC 58 05/08/2018 1346   LDLDIRECT 146.2 08/30/2011 1350    Physical Exam:    VS:  There were no vitals taken for this visit.    Wt Readings from Last 3 Encounters:  11/07/21 205 lb (93 kg)  09/20/21 202 lb (91.6 kg)  09/05/21 203 lb (92.1 kg)     GEN: Well nourished, well developed in no acute distress HEENT: Normal NECK: No JVD;  No carotid bruits LYMPHATICS: No lymphadenopathy CARDIAC:RRR, no murmurs, rubs, gallops RESPIRATORY:  Clear to auscultation without rales, wheezing or rhonchi  ABDOMEN: Soft, non-tender, non-distended MUSCULOSKELETAL:  No edema; No deformity  SKIN: Warm and dry NEUROLOGIC:  Alert and oriented x 3 PSYCHIATRIC:  Normal affect   ASSESSMENT:    1. OSA (obstructive sleep apnea)   2. Primary hypertension    PLAN:    In order of problems listed above:  1.  OSA - The patient is tolerating PAP therapy well without any problems. The PAP download performed by his DME was personally reviewed and interpreted by me today and showed an AHI of 11.4 /hr on auto CPAP from 4-15 cm H2O with 3% compliance in using more than 4 hours nightly.  The patient has been using and benefiting from PAP use and will continue to benefit from therapy.  -his AHI remains too high despite auto CPAP.  His 95th percentile pressure is 9.3 cm H2O.  His mask leak average is only 1.9 L/min.  I suspect that he is mouth breathing during REM sleep -he has a chin strap but has not used it and I encouraged him to start using it -Encouraged him to be more compliant with his device  2.  HTN  -BP is controlled on exam today -Continue prescription drug management with Cardizem CD 240 mg daily, lisinopril 5 mg daily and Toprol-XL 50 mg daily with as needed refills -SCr 1.13 in July 2021   Medication Adjustments/Labs and Tests Ordered: Current medicines are reviewed at length with the patient today.  Concerns regarding medicines are outlined above.  No orders of the defined types were placed in this encounter.  No orders of the defined types were placed in this encounter.   Signed, Fransico Him, MD  11/27/2021 2:20 PM    [place]

## 2021-11-27 NOTE — Patient Instructions (Signed)
Medication Instructions:  Your physician recommends that you continue on your current medications as directed. Please refer to the Current Medication list given to you today.  *If you need a refill on your cardiac medications before your next appointment, please call your pharmacy*   Lab Work: None If you have labs (blood work) drawn today and your tests are completely normal, you will receive your results only by: MyChart Message (if you have MyChart) OR A paper copy in the mail If you have any lab test that is abnormal or we need to change your treatment, we will call you to review the results.   Follow-Up: At [facility], you and your health needs are our priority.  As part of our continuing mission to provide you with exceptional heart care, we have created designated Provider Care Teams.  These Care Teams include your primary Cardiologist (physician) and Advanced Practice Providers (APPs -  Physician Assistants and Nurse Practitioners) who all work together to provide you with the care you need, when you need it.   Your next appointment:   1 year(s)  The format for your next appointment:   In Person  Provider:   Traci Turner, MD { 

## 2021-11-28 ENCOUNTER — Telehealth: Payer: Self-pay | Admitting: *Deleted

## 2021-11-28 NOTE — Telephone Encounter (Signed)
4 week reminder made for 7/11.

## 2021-11-28 NOTE — Telephone Encounter (Signed)
-----   Message from April Garrison, [place] sent at 11/27/2021  2:45 PM EDT ----- Regarding: Download TT wants a download in 4 weeks...  Thanks, April

## 2021-12-12 ENCOUNTER — Encounter: Payer: Self-pay | Admitting: Gastroenterology

## 2021-12-25 ENCOUNTER — Ambulatory Visit: Payer: Federal, State, Local not specified - PPO | Admitting: Cardiology

## 2021-12-25 ENCOUNTER — Encounter: Payer: Self-pay | Admitting: Cardiology

## 2021-12-25 ENCOUNTER — Encounter: Payer: Federal, State, Local not specified - PPO | Admitting: Internal Medicine

## 2021-12-25 VITALS — BP 114/70 | HR 65 | Ht 72.0 in | Wt 206.0 lb

## 2021-12-25 DIAGNOSIS — I10 Essential (primary) hypertension: Secondary | ICD-10-CM

## 2021-12-25 DIAGNOSIS — I4819 Other persistent atrial fibrillation: Secondary | ICD-10-CM | POA: Diagnosis not present

## 2021-12-25 DIAGNOSIS — I2510 Atherosclerotic heart disease of native coronary artery without angina pectoris: Secondary | ICD-10-CM | POA: Diagnosis not present

## 2021-12-25 DIAGNOSIS — Z7901 Long term (current) use of anticoagulants: Secondary | ICD-10-CM

## 2021-12-25 DIAGNOSIS — E782 Mixed hyperlipidemia: Secondary | ICD-10-CM

## 2021-12-25 DIAGNOSIS — I480 Paroxysmal atrial fibrillation: Secondary | ICD-10-CM | POA: Diagnosis not present

## 2021-12-25 NOTE — Assessment & Plan Note (Signed)
Continue with Eliquis. ?

## 2021-12-25 NOTE — Assessment & Plan Note (Signed)
Had mild nonobstructive proximal LAD disease back in 2012.  Continue with medical management which includes Lipitor and Zetia.  No anginal symptoms.

## 2021-12-25 NOTE — Progress Notes (Signed)
Cardiology Office Note:    Date:  12/25/2021   ID:  AVERI CACIOPPO, DOB 03-19-1956, MRN [id number]  PCP:  Biagio Borg, MD   [facility] Providers Cardiologist:  Candee Furbish, MD Sleep Medicine:  Fransico Him, MD     Referring MD: Biagio Borg, MD    History of Present Illness:    ARTRELL LAWLESS is a 66 y.o. male here for follow-up of coronary disease, persistent atrial fibrillation, OSA.  Dr. Radford Pax, now full mask.  Feeling much better.  Had left rotator cuff tear.  Has not been playing golf.  Had PYP injection feels much better.  Denies any chest pain shortness of breath fevers chills nausea vomiting.  He is in atrial fibrillation today on EKG and does not realize it.  Exercising well.   Past Medical History:  Diagnosis Date   Alcoholism in remission ([place]) 08/25/2011   Allergy    seasonal   CAD (coronary artery disease) 08/25/2011   Depression    Gout    Hyperlipidemia    Hypertension    OSA on CPAP 08/21/2018   Mild OSA at 11/hr now on CPAP at 11cm H2O.    Sleep apnea    did use cpap about 10 years ago no use now   Stroke [facility])     Past Surgical History:  Procedure Laterality Date   COLONOSCOPY  10-22-11   3 polyps   FINGER NEUROPLASTY     left index finger   left achillies  2003   POLYPECTOMY      Current Medications: Current Meds  Medication Sig   atorvastatin (LIPITOR) 80 MG tablet TAKE 1 TABLET BY MOUTH EVERY DAY   diltiazem (CARDIZEM CD) 240 MG 24 hr capsule Take 1 capsule (240 mg total) by mouth daily. Please keep upcoming appointment for future refills. Thank you.   ELIQUIS 5 MG TABS tablet TAKE 1 TABLET BY MOUTH TWICE A DAY   ezetimibe (ZETIA) 10 MG tablet TAKE 1 TABLET BY MOUTH EVERY DAY   lisinopril (ZESTRIL) 5 MG tablet Take 1 tablet (5 mg total) by mouth daily. Please keep upcoming appointment for future refills. Thank you.   metoprolol succinate (TOPROL-XL) 50 MG 24 hr tablet Take 1 tablet (50 mg total) by mouth daily. Please keep upcoming  appointment for future refills. Thank you.     Allergies:   Penicillins   Social History   Socioeconomic History   Marital status: Married    Spouse name: Not on file   Number of children: 2   Years of education: Not on file   Highest education level: Not on file  Occupational History   Occupation: Teacher, adult education    Comment: [employer]  Tobacco Use   Smoking status: Never   Smokeless tobacco: Never  Vaping Use   Vaping Use: Never used  Substance and Sexual Activity   Alcohol use: No   Drug use: No   Sexual activity: Not on file  Other Topics Concern   Not on file  Social History Narrative   Not on file   Social Determinants of Health   Financial Resource Strain: Not on file  Food Insecurity: Not on file  Transportation Needs: Not on file  Physical Activity: Not on file  Stress: Not on file  Social Connections: Not on file     Family History: The patient's family history includes Diabetes in his mother; Heart attack in his brother; Hypertension in his mother. There is no history of Colon  cancer, Stomach cancer, Rectal cancer, or Esophageal cancer.  ROS:   Please see the history of present illness.    No fevers chills nausea vomiting syncope bleeding all other systems reviewed and are negative.  EKGs/Labs/Other Studies Reviewed:     EKG:  EKG is  ordered today.  The ekg ordered today demonstrates atrial fibrillation heart rate 65 bpm no changes  Recent Labs: No results found for requested labs within last 365 days.  Recent Lipid Panel    Component Value Date/Time   CHOL 120 12/22/2020 1425   CHOL 116 05/08/2018 1346   TRIG 92.0 12/22/2020 1425   HDL 44.20 12/22/2020 1425   HDL 43 05/08/2018 1346   CHOLHDL 3 12/22/2020 1425   VLDL 18.4 12/22/2020 1425   LDLCALC 58 12/22/2020 1425   LDLCALC 58 05/08/2018 1346   LDLDIRECT 146.2 08/30/2011 1350     Risk Assessment/Calculations:    CHA2DS2-VASc Score = 3   This indicates a 3.2% annual risk of stroke. The  patient's score is based upon: CHF History: 0 HTN History: 1 Diabetes History: 0 Stroke History: 0 Vascular Disease History: 1 Age Score: 1 Gender Score: 0              Physical Exam:    VS:  BP 114/70 (BP Location: Left Arm, Patient Position: Sitting, Cuff Size: Normal)   Pulse 65   Ht 6' (1.829 m)   Wt 206 lb (93.4 kg)   BMI 27.94 kg/m     Wt Readings from Last 3 Encounters:  12/25/21 206 lb (93.4 kg)  11/27/21 206 lb (93.4 kg)  11/07/21 205 lb (93 kg)     GEN:  Well nourished, well developed in no acute distress HEENT: Normal NECK: No JVD; No carotid bruits LYMPHATICS: No lymphadenopathy CARDIAC: Irreg, no murmurs, no rubs, gallops RESPIRATORY:  Clear to auscultation without rales, wheezing or rhonchi  ABDOMEN: Soft, non-tender, non-distended MUSCULOSKELETAL:  No edema; No deformity  SKIN: Warm and dry NEUROLOGIC:  Alert and oriented x 3 PSYCHIATRIC:  Normal affect   ASSESSMENT:    1. Primary hypertension   2. Paroxysmal atrial fibrillation (HCC)   3. Persistent atrial fibrillation ([place])   4. Coronary artery disease involving native coronary artery of native heart without angina pectoris   5. Mixed hyperlipidemia   6. Chronic anticoagulation    PLAN:    In order of problems listed above:  Atrial fibrillation [facility]) Previously described as paroxysmal atrial fibrillation.  He is in atrial fibrillation today.  He does not feel it.  He could be an persistent atrial fibrillation.  This is fine.  He is asymptomatic.  Doing well.  Continue with diltiazem CD2 140 mg once a day for rate control as well as metoprolol XL 50 mg a day.  No changes in prescription drug management.  He is on Eliquis.  No bleeding.  CAD (coronary artery disease) Had mild nonobstructive proximal LAD disease back in 2012.  Continue with medical management which includes Lipitor and Zetia.  No anginal symptoms.  Hypertension Blood pressure under excellent control.  Hyperlipidemia Zetia  and Crestor as described above.  Last LDL 58.  Excellent.  No myalgias.  Hemoglobin A1c 5.9 hemoglobin 15.9 creatinine 1.14 ALT 39.  Chronic anticoagulation Continue with Eliquis.         Medication Adjustments/Labs and Tests Ordered: Current medicines are reviewed at length with the patient today.  Concerns regarding medicines are outlined above.  Orders Placed This Encounter  Procedures  EKG 12-Lead   No orders of the defined types were placed in this encounter.   Patient Instructions  Medication Instructions:  The current medical regimen is effective;  continue present plan and medications.  *If you need a refill on your cardiac medications before your next appointment, please call your pharmacy*  Follow-Up: At [facility], you and your health needs are our priority.  As part of our continuing mission to provide you with exceptional heart care, we have created designated Provider Care Teams.  These Care Teams include your primary Cardiologist (physician) and Advanced Practice Providers (APPs -  Physician Assistants and Nurse Practitioners) who all work together to provide you with the care you need, when you need it.  We recommend signing up for the patient portal called "MyChart".  Sign up information is provided on this After Visit Summary.  MyChart is used to connect with patients for Virtual Visits (Telemedicine).  Patients are able to view lab/test results, encounter notes, upcoming appointments, etc.  Non-urgent messages can be sent to your provider as well.   To learn more about what you can do with MyChart, go to [email or web address].    Your next appointment:   1 year(s)  The format for your next appointment:   In Person  Provider:   Candee Furbish, MD {  Important Information About Sugar         Signed, Candee Furbish, MD  12/25/2021 10:43 AM    [place]

## 2021-12-25 NOTE — Patient Instructions (Signed)

## 2021-12-25 NOTE — Assessment & Plan Note (Signed)
Previously described as paroxysmal atrial fibrillation.  He is in atrial fibrillation today.  He does not feel it.  He could be an persistent atrial fibrillation.  This is fine.  He is asymptomatic.  Doing well.  Continue with diltiazem CD2 140 mg once a day for rate control as well as metoprolol XL 50 mg a day.  No changes in prescription drug management.  He is on Eliquis.  No bleeding.

## 2021-12-25 NOTE — Assessment & Plan Note (Signed)
Zetia and Crestor as described above.  Last LDL 58.  Excellent.  No myalgias.  Hemoglobin A1c 5.9 hemoglobin 15.9 creatinine 1.14 ALT 39.

## 2021-12-25 NOTE — Assessment & Plan Note (Signed)
Blood pressure under excellent control.

## 2022-01-05 ENCOUNTER — Encounter: Payer: Self-pay | Admitting: Internal Medicine

## 2022-01-05 ENCOUNTER — Ambulatory Visit (INDEPENDENT_AMBULATORY_CARE_PROVIDER_SITE_OTHER): Payer: Federal, State, Local not specified - PPO | Admitting: Internal Medicine

## 2022-01-05 VITALS — BP 118/78 | HR 94 | Temp 98.9°F | Ht 72.0 in | Wt 205.0 lb

## 2022-01-05 DIAGNOSIS — E559 Vitamin D deficiency, unspecified: Secondary | ICD-10-CM

## 2022-01-05 DIAGNOSIS — I10 Essential (primary) hypertension: Secondary | ICD-10-CM

## 2022-01-05 DIAGNOSIS — R739 Hyperglycemia, unspecified: Secondary | ICD-10-CM | POA: Diagnosis not present

## 2022-01-05 DIAGNOSIS — Z0001 Encounter for general adult medical examination with abnormal findings: Secondary | ICD-10-CM | POA: Diagnosis not present

## 2022-01-05 DIAGNOSIS — Z125 Encounter for screening for malignant neoplasm of prostate: Secondary | ICD-10-CM

## 2022-01-05 DIAGNOSIS — E538 Deficiency of other specified B group vitamins: Secondary | ICD-10-CM

## 2022-01-05 DIAGNOSIS — E782 Mixed hyperlipidemia: Secondary | ICD-10-CM

## 2022-01-05 NOTE — Assessment & Plan Note (Signed)
Lab Results  Component Value Date   VITAMINB12 446 12/22/2020   Stable, cont oral replacement - b12 1000 mcg qd

## 2022-01-05 NOTE — Assessment & Plan Note (Signed)
Last vitamin D Lab Results  Component Value Date   VD25OH 19.54 (L) 12/22/2020   Low, to start oral replacement

## 2022-01-05 NOTE — Patient Instructions (Signed)
Please take OTC Vitamin D3 at 2000 units per day, indefinitely  Please continue all other medications as before, and refills have been done if requested.  Please have the pharmacy call with any other refills you may need.  Please continue your efforts at being more active, low cholesterol diet, and weight control.  You are otherwise up to date with prevention measures today.  Please keep your appointments with your specialists as you may have planned  Please go to the LAB at the blood drawing area for the tests to be done  You will be contacted by phone if any changes need to be made immediately.  Otherwise, you will receive a letter about your results with an explanation, but please check with MyChart first.  Please remember to sign up for MyChart if you have not done so, as this will be important to you in the future with finding out test results, communicating by private email, and scheduling acute appointments online when needed.  Please make an Appointment to return for your 1 year visit, or sooner if needed

## 2022-01-05 NOTE — Progress Notes (Unsigned)
Patient ID: Richard [name], male   DOB: 04/12/1956, 66 y.o.   MRN: [id number]         Chief Complaint:: wellness exam and low vit d, htn, hld, low b12       HPI:  Richard [name] is a 66 y.o. male here for wellness exam; declines covid booster, colonoscopy as has appt with GI Dr Richard [name] for due for colonoscopy f/u soon. O/w up to date         Not taking VIt D.  Pt denies chest pain, increased sob or doe, wheezing, orthopnea, PND, increased LE swelling, palpitations, dizziness or syncope.   Pt denies polydipsia, polyuria, or new focal neuro s/s.    Pt denies fever, wt loss, night sweats, loss of appetite, or other constitutional symptoms   Wt Readings from Last 3 Encounters:  01/05/22 205 lb (93 kg)  12/25/21 206 lb (93.4 kg)  11/27/21 206 lb (93.4 kg)   BP Readings from Last 3 Encounters:  01/05/22 118/78  12/25/21 114/70  11/27/21 130/70   Immunization History  Administered Date(s) Administered   Fluad Quad(high Dose 65+) 07/21/2020   Influenza, High Dose Seasonal PF 04/14/2021   Influenza,inj,Quad PF,6+ Mos 04/18/2016, 04/17/2018, 03/23/2019   Influenza-Unspecified 04/22/2018   Moderna Sars-Covid-2 Vaccination 09/14/2019, 10/15/2019, 04/18/2020   PNEUMOCOCCAL CONJUGATE-20 12/22/2020   Tdap 08/31/2014   Zoster Recombinat (Shingrix) 12/22/2018, 06/18/2019   There are no preventive care reminders to display for this patient.     Past Medical History:  Diagnosis Date   Alcoholism in remission ([place]) 08/25/2011   Allergy    seasonal   CAD (coronary artery disease) 08/25/2011   Depression    Gout    Hyperlipidemia    Hypertension    OSA on CPAP 08/21/2018   Mild OSA at 11/hr now on CPAP at 11cm H2O.    Sleep apnea    did use cpap about 10 years ago no use now   Stroke [facility])    Past Surgical History:  Procedure Laterality Date   COLONOSCOPY  10-22-11   3 polyps   FINGER NEUROPLASTY     left index finger   left achillies  2003   POLYPECTOMY      reports that he has never  smoked. He has never used smokeless tobacco. He reports that he does not drink alcohol and does not use drugs. family history includes Diabetes in his mother; Heart attack in his brother; Hypertension in his mother. Allergies  Allergen Reactions   Penicillins Other (See Comments)    Siblings are allergic Has patient had a PCN reaction causing immediate rash, facial/tongue/throat swelling, SOB or lightheadedness with hypotension: Unknown Has patient had a PCN reaction causing severe rash involving mucus membranes or skin necrosis: Unknown Has patient had a PCN reaction that required hospitalization: Unknown Has patient had a PCN reaction occurring within the last 10 years: Unknown If all of the above answers are "NO", then may proceed with Cephalosporin use.    Current Outpatient Medications on File Prior to Visit  Medication Sig Dispense Refill   atorvastatin (LIPITOR) 80 MG tablet TAKE 1 TABLET BY MOUTH EVERY DAY 90 tablet 0   diltiazem (CARDIZEM CD) 240 MG 24 hr capsule Take 1 capsule (240 mg total) by mouth daily. Please keep upcoming appointment for future refills. Thank you. 90 capsule 0   ELIQUIS 5 MG TABS tablet TAKE 1 TABLET BY MOUTH TWICE A DAY 180 tablet 1   ezetimibe (ZETIA) 10 MG tablet TAKE  1 TABLET BY MOUTH EVERY DAY 90 tablet 0   lisinopril (ZESTRIL) 5 MG tablet Take 1 tablet (5 mg total) by mouth daily. Please keep upcoming appointment for future refills. Thank you. 90 tablet 0   metoprolol succinate (TOPROL-XL) 50 MG 24 hr tablet Take 1 tablet (50 mg total) by mouth daily. Please keep upcoming appointment for future refills. Thank you. 90 tablet 0   No current facility-administered medications on file prior to visit.        ROS:  All others reviewed and negative.  Objective        PE:  BP 118/78 (BP Location: Right Arm, Patient Position: Sitting, Cuff Size: Large)   Pulse 94   Temp 98.9 F (37.2 C) (Oral)   Ht 6' (1.829 m)   Wt 205 lb (93 kg)   SpO2 97%   BMI 27.80  kg/m                 Constitutional: Pt appears in NAD               HENT: Head: NCAT.                Right Ear: External ear normal.                 Left Ear: External ear normal.                Eyes: . Pupils are equal, round, and reactive to light. Conjunctivae and EOM are normal               Nose: without d/c or deformity               Neck: Neck supple. Gross normal ROM               Cardiovascular: Normal rate and regular rhythm.                 Pulmonary/Chest: Effort normal and breath sounds without rales or wheezing.                Abd:  Soft, NT, ND, + BS, no organomegaly               Neurological: Pt is alert. At baseline orientation, motor grossly intact               Skin: Skin is warm. No rashes, no other new lesions, LE edema - none               Psychiatric: Pt behavior is normal without agitation   Micro: none  Cardiac tracings I have personally interpreted today:  none  Pertinent Radiological findings (summarize): none   Lab Results  Component Value Date   WBC 9.1 12/22/2020   HGB 15.9 12/22/2020   HCT 46.6 12/22/2020   PLT 259.0 12/22/2020   GLUCOSE 84 12/22/2020   CHOL 120 12/22/2020   TRIG 92.0 12/22/2020   HDL 44.20 12/22/2020   LDLDIRECT 146.2 08/30/2011   LDLCALC 58 12/22/2020   ALT 39 12/22/2020   AST 25 12/22/2020   NA 140 12/22/2020   K 3.9 12/22/2020   CL 107 12/22/2020   CREATININE 1.14 12/22/2020   BUN 12 12/22/2020   CO2 22 12/22/2020   TSH 1.46 12/22/2020   PSA 2.66 12/22/2020   INR 0.98 01/05/2018   HGBA1C 5.9 12/22/2020   Assessment/Plan:  Richard [name] is a 66 y.o. White or Caucasian [1] male with  has a  past medical history of Alcoholism in remission ([place]) (08/25/2011), Allergy, CAD (coronary artery disease) (08/25/2011), Depression, Gout, Hyperlipidemia, Hypertension, OSA on CPAP (08/21/2018), Sleep apnea, and Stroke ([place]).  Vitamin D deficiency Last vitamin D Lab Results  Component Value Date   VD25OH 19.54 (L) 12/22/2020    Low, to start oral replacement   B12 deficiency Lab Results  Component Value Date   VITAMINB12 446 12/22/2020   Stable, cont oral replacement - b12 1000 mcg qd   Encounter for well adult exam with abnormal findings Age and sex appropriate education and counseling updated with regular exercise and diet Referrals for preventative services - has colonoscopy soon Immunizations addressed - declines covid booster Smoking counseling  - none needed Evidence for depression or other mood disorder - none significant Most recent labs reviewed. I have personally reviewed and have noted: 1) the patient's medical and social history 2) The patient's current medications and supplements 3) The patient's height, weight, and BMI have been recorded in the chart   Hypertension BP Readings from Last 3 Encounters:  01/05/22 118/78  12/25/21 114/70  11/27/21 130/70   Stable, pt to continue medical treatment card cd 240 mg qd  Hyperlipidemia Lab Results  Component Value Date   LDLCALC 58 12/22/2020   Stable, pt to continue current statin lipitor 80 mg qd   Hyperglycemia Lab Results  Component Value Date   HGBA1C 5.9 12/22/2020   Stable, pt to continue current medical treatment  - diet, wt control  Followup: Return in about 1 year (around 01/06/2023).  Cathlean Cower, MD 01/07/2022 3:02 PM [facility]

## 2022-01-07 ENCOUNTER — Encounter: Payer: Self-pay | Admitting: Internal Medicine

## 2022-01-07 NOTE — Assessment & Plan Note (Signed)
Lab Results  Component Value Date   HGBA1C 5.9 12/22/2020   Stable, pt to continue current medical treatment  - diet, wt control

## 2022-01-07 NOTE — Assessment & Plan Note (Signed)
Age and sex appropriate education and counseling updated with regular exercise and diet Referrals for preventative services - has colonoscopy soon Immunizations addressed - declines covid booster Smoking counseling  - none needed Evidence for depression or other mood disorder - none significant Most recent labs reviewed. I have personally reviewed and have noted: 1) the patient's medical and social history 2) The patient's current medications and supplements 3) The patient's height, weight, and BMI have been recorded in the chart

## 2022-01-07 NOTE — Assessment & Plan Note (Signed)
Lab Results  Component Value Date   LDLCALC 58 12/22/2020   Stable, pt to continue current statin lipitor 80 mg qd

## 2022-01-07 NOTE — Assessment & Plan Note (Signed)
BP Readings from Last 3 Encounters:  01/05/22 118/78  12/25/21 114/70  11/27/21 130/70   Stable, pt to continue medical treatment card cd 240 mg qd

## 2022-01-11 ENCOUNTER — Other Ambulatory Visit: Payer: Self-pay | Admitting: Cardiology

## 2022-01-16 ENCOUNTER — Other Ambulatory Visit: Payer: Self-pay | Admitting: Cardiology

## 2022-01-20 ENCOUNTER — Other Ambulatory Visit: Payer: Self-pay | Admitting: Cardiology

## 2022-01-27 ENCOUNTER — Other Ambulatory Visit: Payer: Self-pay | Admitting: Cardiology

## 2022-01-27 DIAGNOSIS — I480 Paroxysmal atrial fibrillation: Secondary | ICD-10-CM

## 2022-01-29 NOTE — Telephone Encounter (Signed)
Prescription refill request for Eliquis received. Indication: PAF Last office visit: 12/25/21  Richard Barrow MD Scr: 1.14 on 12/22/20 Age: 66 Weight: 93.4kg  Based on above findings Eliquis '5mg'$  twice daily is the appropriate dose.  Past is past due for labs but they have been ordered.  Refill approved x 1 only.

## 2022-01-30 ENCOUNTER — Ambulatory Visit: Payer: Federal, State, Local not specified - PPO | Admitting: Gastroenterology

## 2022-02-27 ENCOUNTER — Encounter: Payer: Self-pay | Admitting: Nurse Practitioner

## 2022-02-27 ENCOUNTER — Ambulatory Visit: Payer: Federal, State, Local not specified - PPO | Admitting: Nurse Practitioner

## 2022-02-27 ENCOUNTER — Other Ambulatory Visit (INDEPENDENT_AMBULATORY_CARE_PROVIDER_SITE_OTHER): Payer: Federal, State, Local not specified - PPO

## 2022-02-27 ENCOUNTER — Telehealth: Payer: Self-pay

## 2022-02-27 VITALS — BP 119/72 | HR 64 | Ht 72.0 in | Wt 207.0 lb

## 2022-02-27 DIAGNOSIS — Z7901 Long term (current) use of anticoagulants: Secondary | ICD-10-CM | POA: Diagnosis not present

## 2022-02-27 DIAGNOSIS — E559 Vitamin D deficiency, unspecified: Secondary | ICD-10-CM | POA: Diagnosis not present

## 2022-02-27 DIAGNOSIS — E782 Mixed hyperlipidemia: Secondary | ICD-10-CM

## 2022-02-27 DIAGNOSIS — E538 Deficiency of other specified B group vitamins: Secondary | ICD-10-CM | POA: Diagnosis not present

## 2022-02-27 DIAGNOSIS — R739 Hyperglycemia, unspecified: Secondary | ICD-10-CM | POA: Diagnosis not present

## 2022-02-27 DIAGNOSIS — Z125 Encounter for screening for malignant neoplasm of prostate: Secondary | ICD-10-CM

## 2022-02-27 DIAGNOSIS — Z86010 Personal history of colonic polyps: Secondary | ICD-10-CM

## 2022-02-27 LAB — BASIC METABOLIC PANEL
BUN: 14 mg/dL (ref 6–23)
CO2: 26 mEq/L (ref 19–32)
Calcium: 9.5 mg/dL (ref 8.4–10.5)
Chloride: 106 mEq/L (ref 96–112)
Creatinine, Ser: 1.09 mg/dL (ref 0.40–1.50)
GFR: 70.15 mL/min (ref >60.00)
Glucose, Bld: 92 mg/dL (ref 70–99)
Potassium: 4.1 mEq/L (ref 3.5–5.1)
Sodium: 140 mEq/L (ref 135–145)

## 2022-02-27 LAB — CBC WITH DIFFERENTIAL/PLATELET
Basophils Absolute: 0.1 10*3/uL (ref 0.0–0.1)
Basophils Relative: 1.1 % (ref 0.0–3.0)
Eosinophils Absolute: 0.1 10*3/uL (ref 0.0–0.7)
Eosinophils Relative: 1.2 % (ref 0.0–5.0)
HCT: 49.7 % (ref 39.0–52.0)
Hemoglobin: 16.6 g/dL (ref 13.0–17.0)
Lymphocytes Relative: 27 % (ref 12.0–46.0)
Lymphs Abs: 2 10*3/uL (ref 0.7–4.0)
MCHC: 33.4 g/dL (ref 30.0–36.0)
MCV: 94.1 fl (ref 78.0–100.0)
Monocytes Absolute: 0.7 10*3/uL (ref 0.1–1.0)
Monocytes Relative: 9.5 % (ref 3.0–12.0)
Neutro Abs: 4.5 10*3/uL (ref 1.4–7.7)
Neutrophils Relative %: 61.2 % (ref 43.0–77.0)
Platelets: 176 10*3/uL (ref 150.0–400.0)
RBC: 5.28 Mil/uL (ref 4.22–5.81)
RDW: 13 % (ref 11.5–15.5)
WBC: 7.4 10*3/uL (ref 4.0–10.5)

## 2022-02-27 LAB — URINALYSIS, ROUTINE W REFLEX MICROSCOPIC
Bilirubin Urine: NEGATIVE
Hgb urine dipstick: NEGATIVE
Ketones, ur: NEGATIVE
Leukocytes,Ua: NEGATIVE
Nitrite: NEGATIVE
RBC / HPF: NONE SEEN (ref 0–?)
Specific Gravity, Urine: 1.02 (ref 1.000–1.030)
Total Protein, Urine: NEGATIVE
Urine Glucose: NEGATIVE
Urobilinogen, UA: 0.2 (ref 0.0–1.0)
pH: 5.5 (ref 5.0–8.0)

## 2022-02-27 LAB — LIPID PANEL
Cholesterol: 113 mg/dL (ref 0–200)
HDL: 43.8 mg/dL (ref >39.00)
LDL Cholesterol: 52 mg/dL (ref 0–99)
NonHDL: 69.53
Total CHOL/HDL Ratio: 3
Triglycerides: 90 mg/dL (ref 0.0–149.0)
VLDL: 18 mg/dL (ref 0.0–40.0)

## 2022-02-27 LAB — HEPATIC FUNCTION PANEL
ALT: 48 U/L (ref 0–53)
AST: 31 U/L (ref 0–37)
Albumin: 4.4 g/dL (ref 3.5–5.2)
Alkaline Phosphatase: 92 U/L (ref 39–117)
Bilirubin, Direct: 0.2 mg/dL (ref 0.0–0.3)
Total Bilirubin: 1 mg/dL (ref 0.2–1.2)
Total Protein: 7.1 g/dL (ref 6.0–8.3)

## 2022-02-27 LAB — HEMOGLOBIN A1C: Hgb A1c MFr Bld: 6 % (ref 4.6–6.5)

## 2022-02-27 LAB — VITAMIN B12: Vitamin B-12: 523 pg/mL (ref 211–911)

## 2022-02-27 LAB — PSA: PSA: 2.06 ng/mL (ref 0.10–4.00)

## 2022-02-27 LAB — TSH: TSH: 1.99 u[IU]/mL (ref 0.35–5.50)

## 2022-02-27 LAB — VITAMIN D 25 HYDROXY (VIT D DEFICIENCY, FRACTURES): VITD: 23.62 ng/mL — ABNORMAL LOW (ref 30.00–100.00)

## 2022-02-27 NOTE — Patient Instructions (Addendum)
_______________________________________________________  If you are age 67 or older, your body mass index should be between 23-30. Your Body mass index is 28.07 kg/m. If this is out of the aforementioned range listed, please consider follow up with your Primary Care Provider.  If you are age 77 or younger, your body mass index should be between 19-25. Your Body mass index is 28.07 kg/m. If this is out of the aformentioned range listed, please consider follow up with your Primary Care Provider.   ________________________________________________________  The [facility] providers would like to encourage you to use [facility] to communicate with providers for non-urgent requests or questions.  Due to long hold times on the telephone, sending your provider a message by [facility] may be a faster and more efficient way to get a response.  Please allow 48 business hours for a response.  Please remember that this is for non-urgent requests.  _______________________________________________________  Dennis Bast have been scheduled for a colonoscopy. Please follow written instructions given to you at your visit today.  Please pick up your prep supplies at the pharmacy within the next 1-3 days. If you use inhalers (even only as needed), please bring them with you on the day of your procedure.  You will be contacted by our office prior to your procedure for directions on holding your Eliquis.  If you do not hear from our office 2 week prior to your scheduled procedure, please call [phone] to discuss.   Please call with any questions or concerns.  It was a pleasure to see you today!  Thank you for trusting me with your gastrointestinal care!

## 2022-02-27 NOTE — Progress Notes (Signed)
Chief Complaint:  time for colonoscopy    Assessment & Plan   # 66 year old male with a history of adenomatous colon polyps. He had a 3 mm polyp removed at time of last colonoscopy in 2016 pathology compatible with mixed tubular adenoma/hyperplastic polyp. Due for 7 year surveillance colonoscopy. No alarm features.  Schedule for a colonoscopy. The risks and benefits of colonoscopy with possible polypectomy / biopsies were discussed and the patient agrees to proceed.    # Afib, on Eliquis Hold Eliquis for 2 days before procedure - will instruct when and how to resume after procedure. Patient understands that there is a low but real risk of cardiovascular event such as heart attack, stroke, or embolism /  thrombosis while off blood thinner. The patient consents to proceed. Will communicate by phone or EMR with patient's prescribing provider to confirm that holding Eliquis is reasonable in this case.    # History of CVA in 2019   HPI:    Richard [name] is a 66 y.o. year old  known to Dr. Ardis Hughs with a past medical history of CAD, OSA on CPAP, HTN, HLD, CVA, Afib on Eliquis.  See PMH / [place] for additional history  Richard [name] had a small adenomatous polyp removed at time of last colonoscopy May 2016.  He is here to get scheduled for a 7-year surveillance colonoscopy.  Since his last colonoscopy he suffered a stroke in 2019.  He had some residual deficits which resolved with therapy.  He is now on Eliquis for atrial fibrillation.  He has not had any change in family history regarding colon cancer.  He has not had any bowel changes.  No blood in stool.   Previous Labs / Imaging::    Latest Ref Rng & Units 12/22/2020    2:25 PM 12/17/2019    9:37 AM 09/02/2019    3:23 PM  CBC  WBC 4.0 - 10.5 K/uL 9.1  6.8  7.0   Hemoglobin 13.0 - 17.0 g/dL 15.9  15.3  16.0   Hematocrit 39.0 - 52.0 % 46.6  44.7  46.2   Platelets 150.0 - 400.0 K/uL 259.0  148.0  189     No results found for: "LIPASE"    Latest  Ref Rng & Units 12/22/2020    2:25 PM 12/17/2019    9:37 AM 09/02/2019    3:23 PM  CMP  Glucose 70 - 99 mg/dL 84  96  84   BUN 6 - 23 mg/dL '12  16  12   '$ Creatinine 0.40 - 1.50 mg/dL 1.14  1.13  0.90   Sodium 135 - 145 mEq/L 140  140  142   Potassium 3.5 - 5.1 mEq/L 3.9  4.1  4.3   Chloride 96 - 112 mEq/L 107  107  107   CO2 19 - 32 mEq/L '22  23  20   '$ Calcium 8.4 - 10.5 mg/dL 9.6  9.1  9.3   Total Protein 6.0 - 8.3 g/dL 6.9  6.6    Total Bilirubin 0.2 - 1.2 mg/dL 0.9  0.8    Alkaline Phos 39 - 117 U/L 92  77    AST 0 - 37 U/L 25  23    ALT 0 - 53 U/L 39  33        Previous GI Evaluation   Surveillance Colonoscopy 2016 1. 3 mm Sessile polyp was found in the ascending colon; polypectomy was performed with a cold snare 2. The examination was  otherwise normal  Surgical [P], ascending, polyp - MIXED TUBULAR ADENOMA/HYPERPLASTIC COLONIC POLYP, FRAGMENTED. - NO HIGH GRADE DYSPLASIA OR MALIGNANCY IDENTIFIED.   Past Medical History:  Diagnosis Date   Alcoholism in remission ([place]) 08/25/2011   Allergy    seasonal   Atrial fibrillation ([facility])    CAD (coronary artery disease) 08/25/2011   Colon polyps    Depression    Gout    Hyperlipidemia    Hypertension    OSA on CPAP 08/21/2018   Mild OSA at 11/hr now on CPAP at 11cm H2O.    Sleep apnea    did use cpap about 10 years ago no use now   Stroke [facility])    Past Surgical History:  Procedure Laterality Date   COLONOSCOPY  10-22-11   3 polyps   FINGER NEUROPLASTY     left index finger   left achillies  2003   POLYPECTOMY     Family History  Problem Relation Age of Onset   Hypertension Mother    Diabetes Mother    Heart attack Brother    Colon cancer Neg Hx    Stomach cancer Neg Hx    Rectal cancer Neg Hx    Esophageal cancer Neg Hx    Social History   Tobacco Use   Smoking status: Never   Smokeless tobacco: Never  Vaping Use   Vaping Use: Never used  Substance Use Topics   Alcohol use: Not Currently   Drug use: No    Current Outpatient Medications  Medication Sig Dispense Refill   apixaban (ELIQUIS) 5 MG TABS tablet TAKE 1 TABLET BY MOUTH TWICE A DAY 180 tablet 0   atorvastatin (LIPITOR) 80 MG tablet TAKE 1 TABLET BY MOUTH EVERY DAY 90 tablet 3   calcium-vitamin D (OSCAL WITH D) 500-5 MG-MCG tablet Take 1 tablet by mouth.     diltiazem (CARDIZEM CD) 240 MG 24 hr capsule Take 1 capsule (240 mg total) by mouth daily. 90 capsule 3   ezetimibe (ZETIA) 10 MG tablet TAKE 1 TABLET BY MOUTH EVERY DAY 90 tablet 3   lisinopril (ZESTRIL) 5 MG tablet Take 1 tablet (5 mg total) by mouth daily. 90 tablet 3   metoprolol succinate (TOPROL-XL) 50 MG 24 hr tablet Take 1 tablet (50 mg total) by mouth daily. Please keep upcoming appointment for future refills. Thank you. 90 tablet 0   NON FORMULARY B12  pt take a 2-3 times aweek     No current facility-administered medications for this visit.   Allergies  Allergen Reactions   Penicillins Other (See Comments)    Siblings are allergic Has patient had a PCN reaction causing immediate rash, facial/tongue/throat swelling, SOB or lightheadedness with hypotension: Unknown Has patient had a PCN reaction causing severe rash involving mucus membranes or skin necrosis: Unknown Has patient had a PCN reaction that required hospitalization: Unknown Has patient had a PCN reaction occurring within the last 10 years: Unknown If all of the above answers are "NO", then may proceed with Cephalosporin use.      Review of Systems: Positive for back pain.  All other systems reviewed and negative except where noted in HPI.   Wt Readings from Last 3 Encounters:  02/27/22 207 lb (93.9 kg)  01/05/22 205 lb (93 kg)  12/25/21 206 lb (93.4 kg)    Physical Exam   BP 119/72   Pulse 64   Ht 6' (1.829 m)   Wt 207 lb (93.9 kg)   BMI 28.07 kg/m  Constitutional:  Generally well appearing male in no acute distress. Psychiatric: Pleasant. Normal mood and affect. Behavior is normal. EENT:  Pupils normal.  Conjunctivae are normal. No scleral icterus. Neck supple.  Cardiovascular: Normal rate, regular rhythm. No edema Pulmonary/chest: Effort normal and breath sounds normal. No wheezing, rales or rhonchi. Abdominal: Soft, nondistended, nontender. Bowel sounds active throughout. There are no masses palpable. No hepatomegaly. Neurological: Alert and oriented to person place and time. Skin: Skin is warm and dry. No rashes noted.  Tye Savoy, NP  02/27/2022, 12:08 PM

## 2022-02-27 NOTE — Telephone Encounter (Signed)
[facility] Pre-operative Risk Assessment     Request for surgical clearance:     Endoscopy Procedure  What type of surgery is being performed?     Colonoscopy  When is this surgery scheduled?     04-03-2022  What type of clearance is required ?   Pharmacy  Are there any medications that need to be held prior to surgery and how long? Eliquis 2 day hold  Practice name and name of physician performing surgery?      [facility]  What is your office phone and fax number?      Phone- [phone]  [phone]  Anesthesia type (None, local, MAC, general) ?       MAC

## 2022-02-27 NOTE — Progress Notes (Signed)
Agree with assessment and plan as outlined.  

## 2022-02-28 NOTE — Telephone Encounter (Signed)
Patient with diagnosis of atrial fibrillation on Eliquis for anticoagulation.    Procedure: colonoscopy Date of procedure: 04/03/22   CHA2DS2-VASc Score = 5   This indicates a 7.2% annual risk of stroke. The patient's score is based upon: CHF History: 0 HTN History: 1 Diabetes History: 0 Stroke History: 2 Vascular Disease History: 1 Age Score: 1 Gender Score: 0    Left parietal temporal infarct 12/2017 secondary to new onset AF, not yet on AC  CrCl 78 Platelet count 176  GI asking for 2 day hold on Eliquis, due to history of stroke will confirm with MD that he should be okay to hold Eliquis for 2 days without bridging  **This guidance is not considered finalized until pre-operative APP has relayed final recommendations.**

## 2022-03-02 NOTE — Telephone Encounter (Signed)
Primary Cardiologist:Richard Marlou Porch, MD   Preoperative team, please contact this patient and set up a phone call appointment for the week of Oct 2  for further preoperative risk assessment. Please obtain consent and complete medication review. Thank you for your help.   I confirm that guidance regarding antiplatelet and oral anticoagulation therapy has been completed and, if necessary, noted below.   Richard Life, NP-C   03/02/2022, 7:24 AM [address] Office [phone] Fax [phone]

## 2022-03-04 ENCOUNTER — Other Ambulatory Visit: Payer: Self-pay | Admitting: Cardiology

## 2022-03-05 ENCOUNTER — Telehealth: Payer: Self-pay | Admitting: *Deleted

## 2022-03-05 NOTE — Telephone Encounter (Signed)
Pt agreeable to plan of care for tele pre op appt 03/20/22 @ 9 am. Med rec and consent are done.   Patient Consent for Virtual Visit        Richard [name] has provided verbal consent on 03/05/2022 for a virtual visit (video or telephone).   CONSENT FOR VIRTUAL VISIT FOR:  Richard [name]  By participating in this virtual visit I agree to the following:  I hereby voluntarily request, consent and authorize [facility] and its employed or contracted physicians, physician assistants, nurse practitioners or other licensed health care professionals (the Practitioner), to provide me with telemedicine health care services (the "Services") as deemed necessary by the treating Practitioner. I acknowledge and consent to receive the Services by the Practitioner via telemedicine. I understand that the telemedicine visit will involve communicating with the Practitioner through live audiovisual communication technology and the disclosure of certain medical information by electronic transmission. I acknowledge that I have been given the opportunity to request an in-person assessment or other available alternative prior to the telemedicine visit and am voluntarily participating in the telemedicine visit.  I understand that I have the right to withhold or withdraw my consent to the use of telemedicine in the course of my care at any time, without affecting my right to future care or treatment, and that the Practitioner or I may terminate the telemedicine visit at any time. I understand that I have the right to inspect all information obtained and/or recorded in the course of the telemedicine visit and may receive copies of available information for a reasonable fee.  I understand that some of the potential risks of receiving the Services via telemedicine include:  Delay or interruption in medical evaluation due to technological equipment failure or disruption; Information transmitted may not be sufficient  (e.g. poor resolution of images) to allow for appropriate medical decision making by the Practitioner; and/or  In rare instances, security protocols could fail, causing a breach of personal health information.  Furthermore, I acknowledge that it is my responsibility to provide information about my medical history, conditions and care that is complete and accurate to the best of my ability. I acknowledge that Practitioner's advice, recommendations, and/or decision may be based on factors not within their control, such as incomplete or inaccurate data provided by me or distortions of diagnostic images or specimens that may result from electronic transmissions. I understand that the practice of medicine is not an exact science and that Practitioner makes no warranties or guarantees regarding treatment outcomes. I acknowledge that a copy of this consent can be made available to me via my patient portal ([facility]), or I can request a printed copy by calling the office of [facility].    I understand that my insurance will be billed for this visit.   I have read or had this consent read to me. I understand the contents of this consent, which adequately explains the benefits and risks of the Services being provided via telemedicine.  I have been provided ample opportunity to ask questions regarding this consent and the Services and have had my questions answered to my satisfaction. I give my informed consent for the services to be provided through the use of telemedicine in my medical care

## 2022-03-05 NOTE — Telephone Encounter (Signed)
Pt agreeable to plan of care for tele pre op appt 03/20/22 @ 9 am. Med rec and consent are done.

## 2022-03-19 NOTE — Progress Notes (Signed)
Virtual Visit via Telephone Note   Because of Richard [name]'s co-morbid illnesses, he is at least at moderate risk for complications without adequate follow up.  This format is felt to be most appropriate for this patient at this time.  The patient did not have access to video technology/had technical difficulties with video requiring transitioning to audio format only (telephone).  All issues noted in this document were discussed and addressed.  No physical exam could be performed with this format.  Please refer to the patient's chart for his consent to telehealth for [facility].  Evaluation Performed:  Preoperative cardiovascular risk assessment _____________   Date:  03/19/2022   Patient ID:  Richard [name], DOB 1956/02/11, MRN [id number] Patient Location:  Home Provider location:   Office  Primary Care Provider:  Biagio Borg, MD Primary Cardiologist:  Candee Furbish, MD  Chief Complaint / Patient Profile   66 y.o. y/o male with a h/o CAD with mild nonobstructive proximal LAD disease on cath 2012, persistent atrial fibrillation on chronic anticoagulation, OSA, HTN, hyperlipidemia who is pending colonoscopy and presents today for telephonic preoperative cardiovascular risk assessment.  Past Medical History    Past Medical History:  Diagnosis Date   Alcoholism in remission ([place]) 08/25/2011   Allergy    seasonal   Atrial fibrillation (HCC)    CAD (coronary artery disease) 08/25/2011   Colon polyps    Depression    Gout    Hyperlipidemia    Hypertension    OSA on CPAP 08/21/2018   Mild OSA at 11/hr now on CPAP at 11cm H2O.    Sleep apnea    did use cpap about 10 years ago no use now   Stroke [facility])    Past Surgical History:  Procedure Laterality Date   COLONOSCOPY  10-22-11   3 polyps   FINGER NEUROPLASTY     left index finger   left achillies  2003   POLYPECTOMY      Allergies  Allergies  Allergen Reactions   Penicillins Other (See Comments)     Siblings are allergic Has patient had a PCN reaction causing immediate rash, facial/tongue/throat swelling, SOB or lightheadedness with hypotension: Unknown Has patient had a PCN reaction causing severe rash involving mucus membranes or skin necrosis: Unknown Has patient had a PCN reaction that required hospitalization: Unknown Has patient had a PCN reaction occurring within the last 10 years: Unknown If all of the above answers are "NO", then may proceed with Cephalosporin use.     History of Present Illness    Richard [name] is a 66 y.o. male who presents via audio/video conferencing for a telehealth visit today.  Pt was last seen in cardiology clinic on 12/27/21 by Dr. Marlou Porch.  At that time Richard [name] was doing well.  The patient is now pending procedure as outlined above. Since his last visit, he denies chest pain, shortness of breath, lower extremity edema, fatigue, palpitations, melena, hematuria, hemoptysis, diaphoresis, weakness, presyncope, syncope, orthopnea, and PND. He is very active in his yard and with his dogs. No concerning symptoms presently.   Home Medications    Prior to Admission medications   Medication Sig Start Date End Date Taking? Authorizing Provider  apixaban (ELIQUIS) 5 MG TABS tablet TAKE 1 TABLET BY MOUTH TWICE A DAY 01/29/22   Jerline Pain, MD  atorvastatin (LIPITOR) 80 MG tablet TAKE 1 TABLET BY MOUTH EVERY DAY 01/11/22   Jerline Pain, MD  calcium-vitamin D (OSCAL WITH D) 500-5 MG-MCG tablet Take 1 tablet by mouth.    [provider]  diltiazem (CARDIZEM CD) 240 MG 24 hr capsule Take 1 capsule (240 mg total) by mouth daily. 01/23/22   Jerline Pain, MD  ezetimibe (ZETIA) 10 MG tablet TAKE 1 TABLET BY MOUTH EVERY DAY 01/11/22   Jerline Pain, MD  lisinopril (ZESTRIL) 5 MG tablet Take 1 tablet (5 mg total) by mouth daily. 01/16/22   Jerline Pain, MD  metoprolol succinate (TOPROL-XL) 50 MG 24 hr tablet TAKE 1 TAB BY MOUTH DAILY. PLEASE KEEP UPCOMING  APPOINTMENT FOR FUTURE REFILLS. THANK YOU. 03/05/22   Jerline Pain, MD  NON FORMULARY B12  pt take a 2-3 times aweek    [provider]    Physical Exam    Vital Signs:  Richard [name] does not have vital signs available for review today.  Given telephonic nature of communication, physical exam is limited. AAOx3. NAD. Normal affect.  Speech and respirations are unlabored.  Accessory Clinical Findings    None  Assessment & Plan    1.  Preoperative Cardiovascular Risk Assessment: The patient is doing well from a cardiac perspective. Therefore, based on ACC/AHA guidelines, the patient would be at acceptable risk for the planned procedure without further cardiovascular testing. The patient was advised that if he develops new symptoms prior to surgery to contact our office to arrange for a follow-up visit, and he verbalized understanding. According to the Revised Cardiac Risk Index (RCRI), his Perioperative Risk of Major Cardiac Event is (%): 0.9. His Functional Capacity in METs is: 7.59 according to the Duke Activity Status Index (DASI).  The patient was advised that if he develops new symptoms prior to surgery to contact our office to arrange for a follow-up visit, and he verbalized understanding.  GI asking for 2 day hold on Eliquis, due to history of stroke will confirm with MD that he should be okay to hold Eliquis for 2 days without bridging. Dr. Marlou Porch agrees that patient may hold Eliquis for 2 days without bridging.     A copy of this note will be routed to requesting surgeon.  Time:   Today, I have spent 7 minutes with the patient with telehealth technology discussing medical history, symptoms, and management plan.     Emmaline Life, NP-C  03/19/2022, 4:56 PM [address], [address] Office [phone] Fax [phone]

## 2022-03-20 ENCOUNTER — Ambulatory Visit: Payer: Federal, State, Local not specified - PPO | Attending: Cardiology | Admitting: Nurse Practitioner

## 2022-03-20 ENCOUNTER — Encounter: Payer: Self-pay | Admitting: Nurse Practitioner

## 2022-03-20 DIAGNOSIS — Z01810 Encounter for preprocedural cardiovascular examination: Secondary | ICD-10-CM

## 2022-03-21 NOTE — Telephone Encounter (Signed)
Patient called back. Informed him to hold his Eliquis 2 days  prior to procedure which is scheduled on 04/03/22 with Dr. Havery Moros. Patient voiced understanding.

## 2022-03-21 NOTE — Telephone Encounter (Signed)
Received cardiac clearance. Called patient to inform to hold eliquis for 2 days prior to procedure. Couldn't leave VM. MyChart message sent to return a call and speak to [place] or [place].

## 2022-04-03 ENCOUNTER — Encounter: Payer: Self-pay | Admitting: Gastroenterology

## 2022-04-03 ENCOUNTER — Ambulatory Visit (AMBULATORY_SURGERY_CENTER): Payer: Federal, State, Local not specified - PPO | Admitting: Gastroenterology

## 2022-04-03 VITALS — BP 96/64 | HR 66 | Temp 97.3°F | Resp 11 | Ht 72.0 in | Wt 207.0 lb

## 2022-04-03 DIAGNOSIS — Z09 Encounter for follow-up examination after completed treatment for conditions other than malignant neoplasm: Secondary | ICD-10-CM

## 2022-04-03 DIAGNOSIS — Z800 Family history of malignant neoplasm of digestive organs: Secondary | ICD-10-CM

## 2022-04-03 DIAGNOSIS — Z86010 Personal history of colonic polyps: Secondary | ICD-10-CM | POA: Diagnosis not present

## 2022-04-03 MED ORDER — SODIUM CHLORIDE 0.9 % IV SOLN: 500.0000 mL | Freq: Once | INTRAVENOUS | Status: DC

## 2022-04-03 NOTE — Progress Notes (Signed)
[facility] History and Physical   Primary Care Physician:  Biagio Borg, MD   Reason for Procedure:   History of colon polyps  Plan:    colonoscopy     HPI: Richard [name] is a 66 y.o. male  here for colonoscopy surveillance - small adenoma removed in 2016, here for surveillance. History of CVA, AF on Eliquis - held for 2 days. Otherwise feels well without any cardiopulmonary symptoms. Denies any problems with his bowels.  I have discussed risks / benefits of anesthesia and endoscopic procedure with Richard [name] and they wish to proceed with the exams as outlined today.    Past Medical History:  Diagnosis Date   Alcoholism in remission ([place]) 08/25/2011   Allergy    seasonal   Atrial fibrillation (HCC)    CAD (coronary artery disease) 08/25/2011   Colon polyps    Depression    Gout    Hyperlipidemia    Hypertension    OSA on CPAP 08/21/2018   Mild OSA at 11/hr now on CPAP at 11cm H2O.    Sleep apnea    did use cpap about 10 years ago no use now   Stroke [facility])     Past Surgical History:  Procedure Laterality Date   COLONOSCOPY  10-22-11   3 polyps   FINGER NEUROPLASTY     left index finger   left achillies  2003   POLYPECTOMY      Prior to Admission medications   Medication Sig Start Date End Date Taking? Authorizing Provider  atorvastatin (LIPITOR) 80 MG tablet TAKE 1 TABLET BY MOUTH EVERY DAY 01/11/22  Yes Jerline Pain, MD  calcium-vitamin D (OSCAL WITH D) 500-5 MG-MCG tablet Take 1 tablet by mouth.   Yes [provider]  diltiazem (CARDIZEM CD) 240 MG 24 hr capsule Take 1 capsule (240 mg total) by mouth daily. 01/23/22  Yes Jerline Pain, MD  ezetimibe (ZETIA) 10 MG tablet TAKE 1 TABLET BY MOUTH EVERY DAY 01/11/22  Yes Jerline Pain, MD  lisinopril (ZESTRIL) 5 MG tablet Take 1 tablet (5 mg total) by mouth daily. 01/16/22  Yes Jerline Pain, MD  metoprolol succinate (TOPROL-XL) 50 MG 24 hr tablet TAKE 1 TAB BY MOUTH DAILY. PLEASE KEEP  UPCOMING APPOINTMENT FOR FUTURE REFILLS. THANK YOU. 03/05/22  Yes Jerline Pain, MD  NON FORMULARY B12  pt take a 2-3 times aweek   Yes [provider]  apixaban (ELIQUIS) 5 MG TABS tablet TAKE 1 TABLET BY MOUTH TWICE A DAY 01/29/22   Jerline Pain, MD    Current Outpatient Medications  Medication Sig Dispense Refill   atorvastatin (LIPITOR) 80 MG tablet TAKE 1 TABLET BY MOUTH EVERY DAY 90 tablet 3   calcium-vitamin D (OSCAL WITH D) 500-5 MG-MCG tablet Take 1 tablet by mouth.     diltiazem (CARDIZEM CD) 240 MG 24 hr capsule Take 1 capsule (240 mg total) by mouth daily. 90 capsule 3   ezetimibe (ZETIA) 10 MG tablet TAKE 1 TABLET BY MOUTH EVERY DAY 90 tablet 3   lisinopril (ZESTRIL) 5 MG tablet Take 1 tablet (5 mg total) by mouth daily. 90 tablet 3   metoprolol succinate (TOPROL-XL) 50 MG 24 hr tablet TAKE 1 TAB BY MOUTH DAILY. PLEASE KEEP UPCOMING APPOINTMENT FOR FUTURE REFILLS. THANK YOU. 90 tablet 2   NON FORMULARY B12  pt take a 2-3 times aweek     apixaban (ELIQUIS) 5 MG TABS tablet TAKE 1  TABLET BY MOUTH TWICE A DAY 180 tablet 0   Current Facility-Administered Medications  Medication Dose Route Frequency Provider Last Rate Last Admin   0.9 %  sodium chloride infusion  500 mL Intravenous Once Aadith Raudenbush, Carlota Raspberry, MD        Allergies as of 04/03/2022 - Review Complete 04/03/2022  Allergen Reaction Noted   Penicillins Other (See Comments) 10/05/2010    Family History  Problem Relation Age of Onset   Hypertension Mother    Diabetes Mother    Heart attack Brother    Colon cancer Neg Hx    Stomach cancer Neg Hx    Rectal cancer Neg Hx    Esophageal cancer Neg Hx     Social History   Socioeconomic History   Marital status: Married    Spouse name: Not on file   Number of children: 2   Years of education: Not on file   Highest education level: Not on file  Occupational History   Occupation: Teacher, adult education    Comment: Engineer, structural   Occupation: retired  Tobacco Use    Smoking status: Never   Smokeless tobacco: Never  Scientific laboratory technician Use: Never used  Substance and Sexual Activity   Alcohol use: Not Currently   Drug use: No   Sexual activity: Not on file  Other Topics Concern   Not on file  Social History Narrative   Not on file   Social Determinants of Health   Financial Resource Strain: Not on file  Food Insecurity: Not on file  Transportation Needs: Not on file  Physical Activity: Not on file  Stress: Not on file  Social Connections: Not on file  Intimate Partner Violence: Not on file    Review of Systems: All other review of systems negative except as mentioned in the HPI.  Physical Exam: Vital signs BP (!) 89/64   Pulse 77   Temp (!) 97.3 F (36.3 C) (Temporal)   Ht 6' (1.829 m)   Wt 207 lb (93.9 kg)   SpO2 98%   BMI 28.07 kg/m   General:   Alert,  Well-developed, pleasant and cooperative in NAD Lungs:  Clear throughout to auscultation.   Heart:  AF regular rate Abdomen:  Soft, nontender and nondistended.   Neuro/Psych:  Alert and cooperative. Normal mood and affect. A and O x 3  Jolly Mango, MD [facility]

## 2022-04-03 NOTE — Patient Instructions (Signed)
Resume Eliquis today as normal   YOU HAD AN ENDOSCOPIC PROCEDURE TODAY AT THE [facility]:   Refer to the procedure report that was given to you for any specific questions about what was found during the examination.  If the procedure report does not answer your questions, please call your gastroenterologist to clarify.  If you requested that your care partner not be given the details of your procedure findings, then the procedure report has been included in a sealed envelope for you to review at your convenience later.  YOU SHOULD EXPECT: Some feelings of bloating in the abdomen. Passage of more gas than usual.  Walking can help get rid of the air that was put into your GI tract during the procedure and reduce the bloating. If you had a lower endoscopy (such as a colonoscopy or flexible sigmoidoscopy) you may notice spotting of blood in your stool or on the toilet paper. If you underwent a bowel prep for your procedure, you may not have a normal bowel movement for a few days.  Please Note:  You might notice some irritation and congestion in your nose or some drainage.  This is from the oxygen used during your procedure.  There is no need for concern and it should clear up in a day or so.  SYMPTOMS TO REPORT IMMEDIATELY:  Following lower endoscopy (colonoscopy or flexible sigmoidoscopy):  Excessive amounts of blood in the stool  Significant tenderness or worsening of abdominal pains  Swelling of the abdomen that is new, acute  Fever of 100F or higher  For urgent or emergent issues, a gastroenterologist can be reached at any hour by calling [phone]. Do not use MyChart messaging for urgent concerns.    DIET:  We do recommend a small meal at first, but then you may proceed to your regular diet.  Drink plenty of fluids but you should avoid alcoholic beverages for 24 hours.  ACTIVITY:  You should plan to take it easy for the rest of today and you should NOT DRIVE or use heavy  machinery until tomorrow (because of the sedation medicines used during the test).    FOLLOW UP: Our staff will call the number listed on your records the next business day following your procedure.  We will call around 7:15- 8:00 am to check on you and address any questions or concerns that you may have regarding the information given to you following your procedure. If we do not reach you, we will leave a message.     SIGNATURES/CONFIDENTIALITY: You and/or your care partner have signed paperwork which will be entered into your electronic medical record.  These signatures attest to the fact that that the information above on your After Visit Summary has been reviewed and is understood.  Full responsibility of the confidentiality of this discharge information lies with you and/or your care-partner.

## 2022-04-03 NOTE — Progress Notes (Signed)
Report to PACU, RN, vss, BBS= Clear.  

## 2022-04-03 NOTE — Op Note (Signed)
[facility] Patient Name: Curran Lenderman Procedure Date: 04/03/2022 1:38 PM MRN: [id number] Endoscopist: Remo Lipps P. Havery Moros , MD Age: 66 Referring MD:  Date of Birth: [birth year] Gender: Male Account #: [id number] Procedure:                Colonoscopy Indications:              High risk colon cancer surveillance: Personal                            history of colonic polyps - small adenoma removed                            10/2014, father had CRC dx age late 70s Medicines:                Monitored Anesthesia Care Procedure:                Pre-Anesthesia Assessment:                           - Prior to the procedure, a History and Physical                            was performed, and patient medications and                            allergies were reviewed. The patient's tolerance of                            previous anesthesia was also reviewed. The risks                            and benefits of the procedure and the sedation                            options and risks were discussed with the patient.                            All questions were answered, and informed consent                            was obtained. Prior Anticoagulants: The patient has                            taken Eliquis (apixaban), last dose was 2 days                            prior to procedure. ASA Grade Assessment: III - A                            patient with severe systemic disease. After                            reviewing the risks and benefits, the patient was  deemed in satisfactory condition to undergo the                            procedure.                           After obtaining informed consent, the colonoscope                            was passed under direct vision. Throughout the                            procedure, the patient's blood pressure, pulse, and                            oxygen saturations were monitored continuously. The                             Olympus CF-HQ190L [id number]) Colonoscope was                            introduced through the anus and advanced to the the                            cecum, identified by appendiceal orifice and                            ileocecal valve. The colonoscopy was performed                            without difficulty. The patient tolerated the                            procedure well. The quality of the bowel                            preparation was good. The ileocecal valve,                            appendiceal orifice, and rectum were photographed. Scope In: 1:45:15 PM Scope Out: 2:07:14 PM Scope Withdrawal Time: 0 hours 18 minutes 5 seconds  Total Procedure Duration: 0 hours 21 minutes 59 seconds  Findings:                 The perianal and digital rectal examinations were                            normal.                           The colon was tortuous.                           Internal hemorrhoids were found during  retroflexion. The hemorrhoids were moderate.                           The exam was otherwise without abnormality. Complications:            No immediate complications. Estimated blood loss:                            None. Estimated Blood Loss:     Estimated blood loss: none. Impression:               - Tortuous colon.                           - Internal hemorrhoids.                           - The examination was otherwise normal.                           - No polyps. Recommendation:           - Patient has a contact number available for                            emergencies. The signs and symptoms of potential                            delayed complications were discussed with the                            patient. Return to normal activities tomorrow.                            Written discharge instructions were provided to the                            patient.                           - Resume previous  diet.                           - Continue present medications.                           - Resume Eliquis today                           - Repeat colonoscopy in 10 years for surveillance. Remo Lipps P. Presley Gora, MD 04/03/2022 2:11:53 PM This report has been signed electronically.

## 2022-04-04 ENCOUNTER — Telehealth: Payer: Self-pay | Admitting: *Deleted

## 2022-04-04 NOTE — Telephone Encounter (Signed)
  Follow up Call-     04/03/2022   12:52 PM  Call back number  Post procedure Call Back phone  # [phone]  Permission to leave phone message Yes     Patient questions:  Do you have a fever, pain , or abdominal swelling? No. Pain Score  0 *  Have you tolerated food without any problems? Yes.    Have you been able to return to your normal activities? Yes.    Do you have any questions about your discharge instructions: Diet   No. Medications  No. Follow up visit  No.  Do you have questions or concerns about your Care? No.  Actions: * If pain score is 4 or above: No action needed, pain <4.

## 2022-07-12 ENCOUNTER — Other Ambulatory Visit: Payer: Self-pay | Admitting: Cardiology

## 2022-07-12 DIAGNOSIS — I480 Paroxysmal atrial fibrillation: Secondary | ICD-10-CM

## 2022-07-12 NOTE — Telephone Encounter (Signed)
Prescription refill request for Eliquis received. Indication: Afib  Last office visit: 03/20/22 ([place])  Scr: 1.09 (02/27/22)  Age: 67 Weight: 93.9kg  Appropriate dose and refill sent to requested pharmacy

## 2022-08-03 ENCOUNTER — Encounter: Payer: Self-pay | Admitting: Family Medicine

## 2022-08-03 ENCOUNTER — Ambulatory Visit: Payer: Federal, State, Local not specified - PPO | Admitting: Family Medicine

## 2022-08-03 VITALS — BP 130/70 | HR 52 | Temp 98.5°F | Ht 72.0 in | Wt 206.5 lb

## 2022-08-03 DIAGNOSIS — R062 Wheezing: Secondary | ICD-10-CM

## 2022-08-03 DIAGNOSIS — R051 Acute cough: Secondary | ICD-10-CM | POA: Diagnosis not present

## 2022-08-03 MED ORDER — PREDNISONE 20 MG PO TABS: ORAL_TABLET | ORAL | 0 refills | Status: DC

## 2022-08-03 NOTE — Progress Notes (Signed)
Established Patient Office Visit  Subjective   Patient ID: Richard [name], male    DOB: Jan 21, 1956  Age: 67 y.o. MRN: [id number]  Chief Complaint  Patient presents with   Cough    Patient complains of cough, x3 weeks, Productive cough, Tried Mucinex with little relief    HPI   Richard [name] is seen with cough.  This started about 2 and half weeks ago.  He was at his in-laws working in the attic for a project that he thought would be about 5 or 10 minutes and this ended up being an hour and a half.  That afternoon he developed some cough that became productive.  Intermittent wheezing since then.  Frequently has increased phlegm production early morning.  He is aware of some wheezing.  No fever.  No chills.  No significant dyspnea.  Non-smoker.  No history of asthma.  He does have history of atrial fibrillation and CAD.  He is on Eliquis, atorvastatin, diltiazem, lisinopril, and Toprol-XL  Past Medical History:  Diagnosis Date   Alcoholism in remission ([place]) 08/25/2011   Allergy    seasonal   Atrial fibrillation (HCC)    CAD (coronary artery disease) 08/25/2011   Colon polyps    Depression    Gout    Hyperlipidemia    Hypertension    OSA on CPAP 08/21/2018   Mild OSA at 11/hr now on CPAP at 11cm H2O.    Sleep apnea    did use cpap about 10 years ago no use now   Stroke [facility])    Past Surgical History:  Procedure Laterality Date   COLONOSCOPY  10-22-11   3 polyps   FINGER NEUROPLASTY     left index finger   left achillies  2003   POLYPECTOMY      reports that he has never smoked. He has never used smokeless tobacco. He reports that he does not currently use alcohol. He reports that he does not use drugs. family history includes Diabetes in his mother; Heart attack in his brother; Hypertension in his mother. Allergies  Allergen Reactions   Penicillins Other (See Comments)    Siblings are allergic Has patient had a PCN reaction causing immediate rash, facial/tongue/throat  swelling, SOB or lightheadedness with hypotension: Unknown Has patient had a PCN reaction causing severe rash involving mucus membranes or skin necrosis: Unknown Has patient had a PCN reaction that required hospitalization: Unknown Has patient had a PCN reaction occurring within the last 10 years: Unknown If all of the above answers are "NO", then may proceed with Cephalosporin use.     Review of Systems  Constitutional:  Negative for chills and fever.  HENT:  Negative for sinus pain.   Respiratory:  Positive for cough, sputum production and wheezing. Negative for hemoptysis and shortness of breath.       Objective:     BP 130/70 (BP Location: Left Arm, Patient Position: Sitting, Cuff Size: Normal)   Pulse (!) 52   Temp 98.5 F (36.9 C) (Oral)   Ht 6' (1.829 m)   Wt 206 lb 8 oz (93.7 kg)   SpO2 98%   BMI 28.01 kg/m    Physical Exam Vitals reviewed.  Cardiovascular:     Rate and Rhythm: Normal rate and regular rhythm.  Pulmonary:     Effort: Pulmonary effort is normal.     Comments: He has some diffuse expiratory wheezes but no respiratory distress.  Pulse oximeter 98% room air.  No rales.  No retractions. Neurological:     Mental Status: He is alert.      No results found for any visits on 08/03/22.    The ASCVD Risk score (Arnett DK, et al., 2019) failed to calculate for the following reasons:   The patient has a prior MI or stroke diagnosis    Assessment & Plan:   Cough and wheezing for 2 and half weeks following increased dust exposure as above.  Suspect reactive airway issue.  No reported fever.  -Recommend prednisone 20 mg 2 tablets daily for 6 days -Did discuss possible albuterol MDI but his symptoms are relatively mild and we will try the prednisone alone first.  Follow-up with primary if not resolving with cough and wheezing next week   Carolann Littler, MD

## 2022-11-26 ENCOUNTER — Other Ambulatory Visit: Payer: Self-pay | Admitting: Cardiology

## 2023-01-04 ENCOUNTER — Other Ambulatory Visit: Payer: Self-pay | Admitting: Cardiology

## 2023-01-10 ENCOUNTER — Ambulatory Visit (INDEPENDENT_AMBULATORY_CARE_PROVIDER_SITE_OTHER): Payer: Federal, State, Local not specified - PPO | Admitting: Internal Medicine

## 2023-01-10 VITALS — BP 118/78 | HR 78 | Temp 99.3°F | Ht 72.0 in | Wt 208.4 lb

## 2023-01-10 DIAGNOSIS — Z0001 Encounter for general adult medical examination with abnormal findings: Secondary | ICD-10-CM

## 2023-01-10 DIAGNOSIS — E559 Vitamin D deficiency, unspecified: Secondary | ICD-10-CM

## 2023-01-10 DIAGNOSIS — E538 Deficiency of other specified B group vitamins: Secondary | ICD-10-CM

## 2023-01-10 DIAGNOSIS — Z125 Encounter for screening for malignant neoplasm of prostate: Secondary | ICD-10-CM

## 2023-01-10 DIAGNOSIS — R739 Hyperglycemia, unspecified: Secondary | ICD-10-CM

## 2023-01-10 DIAGNOSIS — Z0000 Encounter for general adult medical examination without abnormal findings: Secondary | ICD-10-CM

## 2023-01-10 DIAGNOSIS — E782 Mixed hyperlipidemia: Secondary | ICD-10-CM

## 2023-01-10 DIAGNOSIS — I10 Essential (primary) hypertension: Secondary | ICD-10-CM

## 2023-01-10 NOTE — Patient Instructions (Signed)
Please continue all other medications as before, and refills have been done if requested.  Please have the pharmacy call with any other refills you may need.  Please continue your efforts at being more active, low cholesterol diet, and weight control.  You are otherwise up to date with prevention measures today.  Please keep your appointments with your specialists as you may have planned  Please go to the LAB at the blood drawing area for the tests to be done - at the [facility] lab tomorrow  You will be contacted by phone if any changes need to be made immediately.  Otherwise, you will receive a letter about your results with an explanation, but please check with MyChart first.  Please remember to sign up for MyChart if you have not done so, as this will be important to you in the future with finding out test results, communicating by private email, and scheduling acute appointments online when needed.  Please make an Appointment to return for your 1 year visit, or sooner if needed

## 2023-01-10 NOTE — Assessment & Plan Note (Signed)
Last vitamin D Lab Results  Component Value Date   VD25OH 23.62 (L) 02/27/2022   Low, to start oral replacement

## 2023-01-10 NOTE — Progress Notes (Signed)
Patient ID: Richard [name], male   DOB: 1956-03-06, 67 y.o.   MRN: [id number]         Chief Complaint:: wellness exam and low vit d and b12, hyperglycemia, hld, htn       HPI:  Richard [name] is a 67 y.o. male here for wellness exam; declines covid booster, o/w up to date                        Also Pt denies chest pain, increased sob or doe, wheezing, orthopnea, PND, increased LE swelling, palpitations, dizziness or syncope.   Pt denies polydipsia, polyuria, or new focal neuro s/s.    Pt denies fever, wt loss, night sweats, loss of appetite, or other constitutional symptoms    Wt Readings from Last 3 Encounters:  01/10/23 208 lb 6.4 oz (94.5 kg)  08/03/22 206 lb 8 oz (93.7 kg)  04/03/22 207 lb (93.9 kg)   BP Readings from Last 3 Encounters:  01/10/23 118/78  08/03/22 130/70  04/03/22 96/64   Immunization History  Administered Date(s) Administered   Fluad Quad(high Dose 65+) 07/21/2020   Influenza, High Dose Seasonal PF 04/14/2021   Influenza,inj,Quad PF,6+ Mos 04/18/2016, 04/17/2018, 03/23/2019   Influenza-Unspecified 04/22/2018   Moderna Sars-Covid-2 Vaccination 09/14/2019, 10/15/2019, 04/18/2020   PNEUMOCOCCAL CONJUGATE-20 12/22/2020   Tdap 08/31/2014   Zoster Recombinant(Shingrix) 12/22/2018, 06/18/2019   Health Maintenance Due  Topic Date Due   COVID-19 Vaccine (4 - 2023-24 season) 02/16/2022      Past Medical History:  Diagnosis Date   Alcoholism in remission (HCC) 08/25/2011   Allergy    seasonal   Atrial fibrillation (HCC)    CAD (coronary artery disease) 08/25/2011   Colon polyps    Depression    Gout    Hyperlipidemia    Hypertension    OSA on CPAP 08/21/2018   Mild OSA at 11/hr now on CPAP at 11cm H2O.    Sleep apnea    did use cpap about 10 years ago no use now   Stroke [facility]    Past Surgical History:  Procedure Laterality Date   COLONOSCOPY  10-22-11   3 polyps   FINGER NEUROPLASTY     left index finger   left achillies  2003   POLYPECTOMY       reports that he has never smoked. He has never used smokeless tobacco. He reports that he does not currently use alcohol. He reports that he does not use drugs. family history includes Diabetes in his mother; Heart attack in his brother; Hypertension in his mother. Allergies  Allergen Reactions   Penicillins Other (See Comments)    Siblings are allergic Has patient had a PCN reaction causing immediate rash, facial/tongue/throat swelling, SOB or lightheadedness with hypotension: Unknown Has patient had a PCN reaction causing severe rash involving mucus membranes or skin necrosis: Unknown Has patient had a PCN reaction that required hospitalization: Unknown Has patient had a PCN reaction occurring within the last 10 years: Unknown If all of the above answers are "NO", then may proceed with Cephalosporin use.    Current Outpatient Medications on File Prior to Visit  Medication Sig Dispense Refill   apixaban (ELIQUIS) 5 MG TABS tablet TAKE 1 TABLET BY MOUTH TWICE A DAY 180 tablet 1   atorvastatin (LIPITOR) 80 MG tablet TAKE 1 TABLET BY MOUTH EVERY DAY 90 tablet 0   calcium-vitamin D (OSCAL WITH D) 500-5 MG-MCG tablet Take 1 tablet by mouth.  diltiazem (CARDIZEM CD) 240 MG 24 hr capsule TAKE 1 CAPSULE BY MOUTH EVERY DAY 90 capsule 0   ezetimibe (ZETIA) 10 MG tablet TAKE 1 TABLET BY MOUTH EVERY DAY 90 tablet 0   lisinopril (ZESTRIL) 5 MG tablet TAKE 1 TABLET (5 MG TOTAL) BY MOUTH DAILY. 90 tablet 0   metoprolol succinate (TOPROL-XL) 50 MG 24 hr tablet TAKE 1 TAB BY MOUTH DAILY. PLEASE KEEP UPCOMING APPOINTMENT FOR FUTURE REFILLS. THANK YOU. 90 tablet 1   NON FORMULARY B12  pt take a 2-3 times aweek     predniSONE (DELTASONE) 20 MG tablet Take two tablets by mouth once daily for 6 days. 12 tablet 0   No current facility-administered medications on file prior to visit.        ROS:  All others reviewed and negative.  Objective        PE:  BP 118/78 (BP Location: Left Arm, Patient  Position: Sitting, Cuff Size: Normal)   Pulse 78   Temp 99.3 F (37.4 C) (Oral)   Ht 6' (1.829 m)   Wt 208 lb 6.4 oz (94.5 kg)   SpO2 98%   BMI 28.26 kg/m                 Constitutional: Pt appears in NAD               HENT: Head: NCAT.                Right Ear: External ear normal.                 Left Ear: External ear normal.                Eyes: . Pupils are equal, round, and reactive to light. Conjunctivae and EOM are normal               Nose: without d/c or deformity               Neck: Neck supple. Gross normal ROM               Cardiovascular: Normal rate and regular rhythm.                 Pulmonary/Chest: Effort normal and breath sounds without rales or wheezing.                Abd:  Soft, NT, ND, + BS, no organomegaly               Neurological: Pt is alert. At baseline orientation, motor grossly intact               Skin: Skin is warm. No rashes, no other new lesions, LE edema - none               Psychiatric: Pt behavior is normal without agitation   Micro: none  Cardiac tracings I have personally interpreted today:  none  Pertinent Radiological findings (summarize): none   Lab Results  Component Value Date   WBC 7.4 02/27/2022   HGB 16.6 02/27/2022   HCT 49.7 02/27/2022   PLT 176.0 02/27/2022   GLUCOSE 92 02/27/2022   CHOL 113 02/27/2022   TRIG 90.0 02/27/2022   HDL 43.80 02/27/2022   LDLDIRECT 146.2 08/30/2011   LDLCALC 52 02/27/2022   ALT 48 02/27/2022   AST 31 02/27/2022   NA 140 02/27/2022   K 4.1 02/27/2022   CL 106 02/27/2022   CREATININE 1.09 02/27/2022  BUN 14 02/27/2022   CO2 26 02/27/2022   TSH 1.99 02/27/2022   PSA 2.06 02/27/2022   INR 0.98 01/05/2018   HGBA1C 6.0 02/27/2022   Assessment/Plan:  Richard [name] is a 67 y.o. White or Caucasian [1] male with  has a past medical history of Alcoholism in remission (HCC) (08/25/2011), Allergy, Atrial fibrillation (HCC), CAD (coronary artery disease) (08/25/2011), Colon polyps, Depression,  Gout, Hyperlipidemia, Hypertension, OSA on CPAP (08/21/2018), Sleep apnea, and Stroke (HCC).  Vitamin D deficiency Last vitamin D Lab Results  Component Value Date   VD25OH 23.62 (L) 02/27/2022   Low, to start oral replacement   Encounter for well adult exam with abnormal findings Age and sex appropriate education and counseling updated with regular exercise and diet Referrals for preventative services - none needed Immunizations addressed - declines covid booster Smoking counseling  - none needed Evidence for depression or other mood disorder - none significant Most recent labs reviewed. I have personally reviewed and have noted: 1) the patient's medical and social history 2) The patient's current medications and supplements 3) The patient's height, weight, and BMI have been recorded in the chart   B12 deficiency Lab Results  Component Value Date   VITAMINB12 523 02/27/2022   Stable, cont oral replacement - b12 1000 mcg qd   Hyperglycemia Lab Results  Component Value Date   HGBA1C 6.0 02/27/2022   Stable, pt to continue current medical treatment  - diet, wt control   Hyperlipidemia Lab Results  Component Value Date   LDLCALC 52 02/27/2022   Stable, pt to continue current statin lipitor 80 every day, zetia 10 qd   Hypertension BP Readings from Last 3 Encounters:  01/10/23 118/78  08/03/22 130/70  04/03/22 96/64   Stable, pt to continue medical treatmen card cd 240 every day, lisinopril 5 every day, toprol xl 50 qd  Followup: Return in about 1 year (around 01/10/2024).  Oliver Barre, MD 01/12/2023 4:23 PM [facility] [place] [facility]

## 2023-01-12 ENCOUNTER — Encounter: Payer: Self-pay | Admitting: Internal Medicine

## 2023-01-12 NOTE — Assessment & Plan Note (Signed)
Lab Results  Component Value Date   VITAMINB12 523 02/27/2022   Stable, cont oral replacement - b12 1000 mcg qd

## 2023-01-12 NOTE — Assessment & Plan Note (Signed)

## 2023-01-12 NOTE — Assessment & Plan Note (Signed)
BP Readings from Last 3 Encounters:  01/10/23 118/78  08/03/22 130/70  04/03/22 96/64   Stable, pt to continue medical treatmen card cd 240 every day, lisinopril 5 every day, toprol xl 50 qd

## 2023-01-12 NOTE — Assessment & Plan Note (Signed)
Lab Results  Component Value Date   LDLCALC 52 02/27/2022   Stable, pt to continue current statin lipitor 80 every day, zetia 10 qd

## 2023-01-12 NOTE — Assessment & Plan Note (Signed)
Lab Results  Component Value Date   HGBA1C 6.0 02/27/2022   Stable, pt to continue current medical treatment  - diet, wt control

## 2023-01-16 ENCOUNTER — Other Ambulatory Visit: Payer: Self-pay | Admitting: Cardiology

## 2023-01-16 DIAGNOSIS — I480 Paroxysmal atrial fibrillation: Secondary | ICD-10-CM

## 2023-01-17 NOTE — Telephone Encounter (Signed)
Eliquis 5mg  refill request received. Patient is 67 years old, weight-94.5kg, Crea-1.09 on 02/27/22, Diagnosis-Afib, and last seen by Eligha Bridegroom on 03/20/22. Dose is appropriate based on dosing criteria. Will send in refill to requested pharmacy.

## 2023-01-21 ENCOUNTER — Other Ambulatory Visit (INDEPENDENT_AMBULATORY_CARE_PROVIDER_SITE_OTHER): Payer: Federal, State, Local not specified - PPO

## 2023-01-21 DIAGNOSIS — E559 Vitamin D deficiency, unspecified: Secondary | ICD-10-CM | POA: Diagnosis not present

## 2023-01-21 DIAGNOSIS — E782 Mixed hyperlipidemia: Secondary | ICD-10-CM | POA: Diagnosis not present

## 2023-01-21 DIAGNOSIS — Z125 Encounter for screening for malignant neoplasm of prostate: Secondary | ICD-10-CM | POA: Diagnosis not present

## 2023-01-21 DIAGNOSIS — E538 Deficiency of other specified B group vitamins: Secondary | ICD-10-CM | POA: Diagnosis not present

## 2023-01-21 DIAGNOSIS — R739 Hyperglycemia, unspecified: Secondary | ICD-10-CM

## 2023-01-21 LAB — URINALYSIS, ROUTINE W REFLEX MICROSCOPIC
Bilirubin Urine: NEGATIVE
Hgb urine dipstick: NEGATIVE
Ketones, ur: NEGATIVE
Leukocytes,Ua: NEGATIVE
Nitrite: NEGATIVE
RBC / HPF: NONE SEEN (ref 0–?)
Specific Gravity, Urine: 1.015 (ref 1.000–1.030)
Total Protein, Urine: NEGATIVE
Urine Glucose: NEGATIVE
Urobilinogen, UA: 1 (ref 0.0–1.0)
WBC, UA: NONE SEEN (ref 0–?)
pH: 6 (ref 5.0–8.0)

## 2023-01-21 LAB — BASIC METABOLIC PANEL
BUN: 17 mg/dL (ref 6–23)
CO2: 23 mEq/L (ref 19–32)
Calcium: 9.1 mg/dL (ref 8.4–10.5)
Chloride: 108 mEq/L (ref 96–112)
Creatinine, Ser: 1.15 mg/dL (ref 0.40–1.50)
GFR: 65.37 mL/min (ref >60.00)
Glucose, Bld: 102 mg/dL — ABNORMAL HIGH (ref 70–99)
Potassium: 4 mEq/L (ref 3.5–5.1)
Sodium: 140 mEq/L (ref 135–145)

## 2023-01-21 LAB — PSA: PSA: 2.85 ng/mL (ref 0.10–4.00)

## 2023-01-21 LAB — LIPID PANEL
Cholesterol: 97 mg/dL (ref 0–200)
HDL: 35.2 mg/dL — ABNORMAL LOW (ref >39.00)
LDL Cholesterol: 45 mg/dL (ref 0–99)
NonHDL: 61.51
Total CHOL/HDL Ratio: 3
Triglycerides: 84 mg/dL (ref 0.0–149.0)
VLDL: 16.8 mg/dL (ref 0.0–40.0)

## 2023-01-21 LAB — CBC WITH DIFFERENTIAL/PLATELET
Basophils Absolute: 0 10*3/uL (ref 0.0–0.1)
Basophils Relative: 0.7 % (ref 0.0–3.0)
Eosinophils Absolute: 0.2 10*3/uL (ref 0.0–0.7)
Eosinophils Relative: 2.6 % (ref 0.0–5.0)
HCT: 46.3 % (ref 39.0–52.0)
Hemoglobin: 15.5 g/dL (ref 13.0–17.0)
Lymphocytes Relative: 32.7 % (ref 12.0–46.0)
Lymphs Abs: 2.2 10*3/uL (ref 0.7–4.0)
MCHC: 33.4 g/dL (ref 30.0–36.0)
MCV: 93.5 fl (ref 78.0–100.0)
Monocytes Absolute: 0.7 10*3/uL (ref 0.1–1.0)
Monocytes Relative: 9.8 % (ref 3.0–12.0)
Neutro Abs: 3.7 10*3/uL (ref 1.4–7.7)
Neutrophils Relative %: 54.2 % (ref 43.0–77.0)
Platelets: 235 10*3/uL (ref 150.0–400.0)
RBC: 4.96 Mil/uL (ref 4.22–5.81)
RDW: 13.2 % (ref 11.5–15.5)
WBC: 6.8 10*3/uL (ref 4.0–10.5)

## 2023-01-21 LAB — HEMOGLOBIN A1C: Hgb A1c MFr Bld: 6 % (ref 4.6–6.5)

## 2023-01-21 LAB — HEPATIC FUNCTION PANEL
ALT: 41 U/L (ref 0–53)
AST: 28 U/L (ref 0–37)
Albumin: 4.3 g/dL (ref 3.5–5.2)
Alkaline Phosphatase: 88 U/L (ref 39–117)
Bilirubin, Direct: 0.2 mg/dL (ref 0.0–0.3)
Total Bilirubin: 0.7 mg/dL (ref 0.2–1.2)
Total Protein: 6.4 g/dL (ref 6.0–8.3)

## 2023-01-21 LAB — TSH: TSH: 0.82 u[IU]/mL (ref 0.35–5.50)

## 2023-01-21 LAB — VITAMIN D 25 HYDROXY (VIT D DEFICIENCY, FRACTURES): VITD: 19.03 ng/mL — ABNORMAL LOW (ref 30.00–100.00)

## 2023-01-21 LAB — VITAMIN B12: Vitamin B-12: 353 pg/mL (ref 211–911)

## 2023-01-21 NOTE — Progress Notes (Signed)
The test results show that your current treatment is OK, as the tests are stable.  Please continue the same plan.  There is no other need for change of treatment or further evaluation based on these results, at this time.  thanks 

## 2023-03-22 ENCOUNTER — Encounter: Payer: Self-pay | Admitting: Cardiology

## 2023-03-22 ENCOUNTER — Ambulatory Visit: Payer: Federal, State, Local not specified - PPO | Attending: Cardiology | Admitting: Cardiology

## 2023-03-22 VITALS — BP 110/70 | HR 86 | Ht 72.0 in | Wt 208.2 lb

## 2023-03-22 DIAGNOSIS — I4819 Other persistent atrial fibrillation: Secondary | ICD-10-CM | POA: Diagnosis not present

## 2023-03-22 DIAGNOSIS — I10 Essential (primary) hypertension: Secondary | ICD-10-CM

## 2023-03-22 NOTE — Patient Instructions (Signed)

## 2023-03-22 NOTE — Progress Notes (Addendum)
Cardiology Office Note:  .   Date:  03/22/2023  ID:  Richard [name], DOB [birth year], MRN [id number] PCP: Corwin Levins, MD  [facility] Providers Cardiologist:  Donato Schultz, MD Sleep Medicine:  Armanda Magic, MD     History of Present Illness: .   Richard [name] is a 67 y.o. male Discussed with the use of AI scribe software  History of Present Illness   The patient, with a history of atrial fibrillation, coronary disease, and hypertension, is currently on Eliquis, Diltiazem, Zetia, Atorvastatin, and Toprol. He expresses concern about the cost of Eliquis, but understands its effectiveness compared to alternatives. 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  He experienced a respiratory issue after exposure to blown-in insulation in an attic. This resulted in a persistent cough, particularly following deep breaths, which lasted for several weeks. The cough was successfully treated with a course of prednisone, and the patient has since recovered with no residual symptoms.  The patient also has a history of sleep apnea and uses a CPAP machine. However, during the period of respiratory distress, he was unable to use the CPAP machine, leading to a break in his regular usage. He is currently using the CPAP machine about 75-80% of the time and is working towards resuming full-time use.         Studies Reviewed: Marland Kitchen   EKG Interpretation Date/Time:  Friday March 22 2023 09:02:19 EDT Ventricular Rate:  86 PR Interval:    QRS Duration:  88 QT Interval:  364 QTC Calculation: 435 R Axis:   64  Text Interpretation: Atrial fibrillation When compared with ECG of 11-Feb-2018 11:36, Atrial fibrillation has replaced Sinus rhythm Vent. rate has increased BY  33 BPM Nonspecific T wave abnormality now evident in Inferior leads Confirmed by Donato Schultz ([id number]) on 03/22/2023 9:18:49 AM     Risk  Assessment/Calculations:            Physical Exam:   VS:  BP 110/70   Pulse 86   Ht 6' (1.829 m)   Wt 208 lb 3.2 oz (94.4 kg)   SpO2 98%   BMI 28.24 kg/m    Wt Readings from Last 3 Encounters:  03/22/23 208 lb 3.2 oz (94.4 kg)  01/10/23 208 lb 6.4 oz (94.5 kg)  08/03/22 206 lb 8 oz (93.7 kg)    GEN: Well nourished, well developed in no acute distress NECK: No JVD; No carotid bruits CARDIAC: IRRR, no murmurs, no rubs, no gallops RESPIRATORY:  Clear to auscultation without rales, wheezing or rhonchi  ABDOMEN: Soft, non-tender, non-distended EXTREMITIES:  No edema; No deformity   ASSESSMENT AND PLAN: .     Atrial Fibrillation and Coronary Artery Disease Stable on Eliquis 5mg  twice daily and Diltiazem 240mg  daily. Patient expressed concern about the cost of Eliquis. Discussed the alternatives, but decided to continue Eliquis due to its superior efficacy. -Continue Eliquis 5mg  twice daily and Diltiazem 240mg  daily.  Hyperlipidemia LDL controlled at 45 on Atorvastatin 80mg  and Zetia 10mg  daily. -Continue Atorvastatin 80mg  and Zetia 10mg  daily.  Bronchitis Resolved after a course of Prednisone. No current respiratory symptoms. -No changes to the plan.  Obstructive Sleep Apnea Partial compliance with CPAP therapy due to recent bronchitis. Patient is working towards full compliance. -Encourage full compliance with CPAP therapy.  Hypertension Stable on Toprol 50mg  daily. -Continue Toprol 50mg  daily.  Follow-up in 1  year.               Signed, Donato Schultz, MD

## 2023-04-01 ENCOUNTER — Other Ambulatory Visit: Payer: Self-pay | Admitting: Cardiology

## 2023-04-05 ENCOUNTER — Other Ambulatory Visit: Payer: Self-pay | Admitting: Cardiology

## 2023-04-20 ENCOUNTER — Other Ambulatory Visit: Payer: Self-pay | Admitting: Cardiology

## 2023-07-12 ENCOUNTER — Ambulatory Visit: Payer: Self-pay | Admitting: Internal Medicine

## 2023-07-12 MED ORDER — OSELTAMIVIR PHOSPHATE 75 MG PO CAPS: 75.0000 mg | ORAL_CAPSULE | Freq: Two times a day (BID) | ORAL | 0 refills | Status: DC

## 2023-07-12 NOTE — Addendum Note (Signed)
Addended by: Corwin Levins on: 07/12/2023 05:05 PM   Modules accepted: Orders

## 2023-07-12 NOTE — Telephone Encounter (Signed)
Ok this can be done, thanks

## 2023-07-12 NOTE — Telephone Encounter (Signed)
Copied from CRM [id number]. Topic: Clinical - Medical Advice >> Jul 12, 2023  2:53 PM Tiffany H wrote: Reason for CRM: Patient called to advise that his daughter went to Urgent Care and was told to have all people she recently came in contact with to request a preventative prescription for Tamiflu. Please send the prescription to the below pharmacy. Patient advised no active symptoms. Please assist.   [id number] [place] [facility] [place] - [address], [place] [address] Phone: [phone]  Fax: [phone] DEA #: [id number]

## 2023-08-03 ENCOUNTER — Other Ambulatory Visit: Payer: Self-pay | Admitting: Cardiology

## 2023-08-03 DIAGNOSIS — I480 Paroxysmal atrial fibrillation: Secondary | ICD-10-CM

## 2023-08-05 NOTE — Telephone Encounter (Signed)
Prescription refill request for Eliquis received. Indication:afib Last office visit:10/24 Scr:1.15  8/24 Age: 68 Weight:94.4  kg  Prescription refilled

## 2024-01-10 ENCOUNTER — Encounter: Payer: Federal, State, Local not specified - PPO | Admitting: Internal Medicine

## 2024-01-17 ENCOUNTER — Ambulatory Visit: Admitting: Internal Medicine

## 2024-01-17 ENCOUNTER — Encounter: Payer: Self-pay | Admitting: Internal Medicine

## 2024-01-17 VITALS — BP 116/74 | HR 43 | Temp 98.2°F | Ht 72.0 in | Wt 205.0 lb

## 2024-01-17 DIAGNOSIS — I10 Essential (primary) hypertension: Secondary | ICD-10-CM

## 2024-01-17 DIAGNOSIS — E782 Mixed hyperlipidemia: Secondary | ICD-10-CM | POA: Diagnosis not present

## 2024-01-17 DIAGNOSIS — Z0001 Encounter for general adult medical examination with abnormal findings: Secondary | ICD-10-CM | POA: Diagnosis not present

## 2024-01-17 DIAGNOSIS — E538 Deficiency of other specified B group vitamins: Secondary | ICD-10-CM

## 2024-01-17 DIAGNOSIS — Z125 Encounter for screening for malignant neoplasm of prostate: Secondary | ICD-10-CM | POA: Diagnosis not present

## 2024-01-17 DIAGNOSIS — N529 Male erectile dysfunction, unspecified: Secondary | ICD-10-CM

## 2024-01-17 DIAGNOSIS — E559 Vitamin D deficiency, unspecified: Secondary | ICD-10-CM

## 2024-01-17 DIAGNOSIS — R739 Hyperglycemia, unspecified: Secondary | ICD-10-CM

## 2024-01-17 MED ORDER — TADALAFIL 20 MG PO TABS: 10.0000 mg | ORAL_TABLET | ORAL | 11 refills | Status: AC | PRN

## 2024-01-17 NOTE — Assessment & Plan Note (Signed)

## 2024-01-17 NOTE — Assessment & Plan Note (Signed)
 BP Readings from Last 3 Encounters:  01/17/24 116/74  03/22/23 110/70  01/10/23 118/78   Stable, pt to continue medical treatment card cD 240 mg every day, toprol  xl 50 qd

## 2024-01-17 NOTE — Assessment & Plan Note (Signed)
 Last vitamin D  Lab Results  Component Value Date   VD25OH 19.03 (L) 01/21/2023   Low, to start oral replacement

## 2024-01-17 NOTE — Assessment & Plan Note (Signed)
 Lab Results  Component Value Date   LDLCALC 45 01/21/2023   Stable, pt to continue current statin lipitor  80 mg every day, zetia  10 mg qd

## 2024-01-17 NOTE — Assessment & Plan Note (Signed)
 Lab Results  Component Value Date   HGBA1C 6.0 01/21/2023   Stable, pt to continue current medical treatment  - diet, wt control

## 2024-01-17 NOTE — Assessment & Plan Note (Signed)
 Mild to mod new onset, for cialis 20 mg prn, to f/u any worsening symptoms or concerns

## 2024-01-17 NOTE — Progress Notes (Signed)
 Patient ID: Richard [name], male   DOB: [birth year], 68 y.o.   MRN: [id number]         Chief Complaint:: wellness exam and Annual Exam (Annual Exam. Discuss recent erectile dysfunction, within the past few months. Wants to know if medications he's currently on could be causing it)  , low vit d, htn,  hld, hyperglycemia, low b12       HPI:  Richard [name] is a 68 y.o. male here for wellness exam; up to date                   Also has wt down to under 200 a few months ago,but then regained some.  Sees cardiology Dr Jeffrie soon.  Pt denies chest pain, increased sob or doe, wheezing, orthopnea, PND, increased LE swelling, palpitations, dizziness or syncope.   Pt denies polydipsia, polyuria, or new focal neuro s/s.   Does have worsening ED symptoms in the past 3 mo, and he laments wife not so interested to start with     Wt Readings from Last 3 Encounters:  01/17/24 205 lb (93 kg)  03/22/23 208 lb 3.2 oz (94.4 kg)  01/10/23 208 lb 6.4 oz (94.5 kg)   BP Readings from Last 3 Encounters:  01/17/24 116/74  03/22/23 110/70  01/10/23 118/78   Immunization History  Administered Date(s) Administered   Fluad Quad(high Dose 65+) 07/21/2020   Influenza, High Dose Seasonal PF 04/14/2021   Influenza,inj,Quad PF,6+ Mos 04/18/2016, 04/17/2018, 03/23/2019   Influenza-Unspecified 04/22/2018   Moderna Sars-Covid-2 Vaccination 09/14/2019, 10/15/2019, 04/18/2020   PNEUMOCOCCAL CONJUGATE-20 12/22/2020   Tdap 08/31/2014   Zoster Recombinant(Shingrix) 12/22/2018, 06/18/2019   Health Maintenance Due  Topic Date Due   INFLUENZA VACCINE  01/17/2024      Past Medical History:  Diagnosis Date   Alcoholism in remission (HCC) 08/25/2011   Allergy    seasonal   Atrial fibrillation (HCC)    CAD (coronary artery disease) 08/25/2011   Colon polyps    Depression    Gout    Hyperlipidemia    Hypertension    OSA on CPAP 08/21/2018   Mild OSA at 11/hr now on CPAP at 11cm H2O.    Sleep apnea    did use cpap  about 10 years ago no use now   Stroke [facility])    Past Surgical History:  Procedure Laterality Date   COLONOSCOPY  10-22-11   3 polyps   FINGER NEUROPLASTY     left index finger   left achillies  2003   POLYPECTOMY      reports that he has never smoked. He has never used smokeless tobacco. He reports that he does not currently use alcohol. He reports that he does not use drugs. family history includes Diabetes in his mother; Heart attack in his brother; Hypertension in his mother. Allergies  Allergen Reactions   Penicillins Other (See Comments)    Siblings are allergic Has patient had a PCN reaction causing immediate rash, facial/tongue/throat swelling, SOB or lightheadedness with hypotension: Unknown Has patient had a PCN reaction causing severe rash involving mucus membranes or skin necrosis: Unknown Has patient had a PCN reaction that required hospitalization: Unknown Has patient had a PCN reaction occurring within the last 10 years: Unknown If all of the above answers are NO, then may proceed with Cephalosporin use.    Current Outpatient Medications on File Prior to Visit  Medication Sig Dispense Refill   atorvastatin  (LIPITOR ) 80 MG tablet TAKE 1  TABLET BY MOUTH EVERY DAY 90 tablet 3   calcium -vitamin D  (OSCAL WITH D) 500-5 MG-MCG tablet Take 1 tablet by mouth as needed.     diltiazem  (CARDIZEM  CD) 240 MG 24 hr capsule TAKE 1 CAPSULE BY MOUTH EVERY DAY 90 capsule 3   ELIQUIS  5 MG TABS tablet TAKE 1 TABLET BY MOUTH TWICE A DAY 180 tablet 1   ezetimibe  (ZETIA ) 10 MG tablet TAKE 1 TABLET BY MOUTH EVERY DAY 90 tablet 3   lisinopril  (ZESTRIL ) 5 MG tablet TAKE 1 TABLET (5 MG TOTAL) BY MOUTH DAILY. 90 tablet 3   metoprolol  succinate (TOPROL -XL) 50 MG 24 hr tablet Take 1 tablet (50 mg total) by mouth daily. 90 tablet 3   NON FORMULARY B12  pt take a 2-3 times aweek     predniSONE  (DELTASONE ) 20 MG tablet Take two tablets by mouth once daily for 6 days. (Patient not taking: Reported on  03/22/2023) 12 tablet 0   No current facility-administered medications on file prior to visit.        ROS:  All others reviewed and negative.  Objective        PE:  BP 116/74   Pulse (!) 43   Temp 98.2 F (36.8 C)   Ht 6' (1.829 m)   Wt 205 lb (93 kg)   SpO2 99%   BMI 27.80 kg/m                 Constitutional: Pt appears in NAD               HENT: Head: NCAT.                Right Ear: External ear normal.                 Left Ear: External ear normal.                Eyes: . Pupils are equal, round, and reactive to light. Conjunctivae and EOM are normal               Nose: without d/c or deformity               Neck: Neck supple. Gross normal ROM               Cardiovascular: Normal rate and regular rhythm.                 Pulmonary/Chest: Effort normal and breath sounds without rales or wheezing.                Abd:  Soft, NT, ND, + BS, no organomegaly               Neurological: Pt is alert. At baseline orientation, motor grossly intact               Skin: Skin is warm. No rashes, no other new lesions, LE edema - none               Psychiatric: Pt behavior is normal without agitation   Micro: none  Cardiac tracings I have personally interpreted today:  none  Pertinent Radiological findings (summarize): none   Lab Results  Component Value Date   WBC 6.8 01/21/2023   HGB 15.5 01/21/2023   HCT 46.3 01/21/2023   PLT 235.0 01/21/2023   GLUCOSE 102 (H) 01/21/2023   CHOL 97 01/21/2023   TRIG 84.0 01/21/2023   HDL 35.20 (L) 01/21/2023   LDLDIRECT  146.2 08/30/2011   LDLCALC 45 01/21/2023   ALT 41 01/21/2023   AST 28 01/21/2023   NA 140 01/21/2023   K 4.0 01/21/2023   CL 108 01/21/2023   CREATININE 1.15 01/21/2023   BUN 17 01/21/2023   CO2 23 01/21/2023   TSH 0.82 01/21/2023   PSA 2.85 01/21/2023   INR 0.98 01/05/2018   HGBA1C 6.0 01/21/2023   Assessment/Plan:  Richard [name] is a 68 y.o. White or Caucasian [1] male with  has a past medical history of Alcoholism  in remission (HCC) (08/25/2011), Allergy, Atrial fibrillation (HCC), CAD (coronary artery disease) (08/25/2011), Colon polyps, Depression, Gout, Hyperlipidemia, Hypertension, OSA on CPAP (08/21/2018), Sleep apnea, and Stroke (HCC).  Encounter for well adult exam with abnormal findings Age and sex appropriate education and counseling updated with regular exercise and diet Referrals for preventative services - none needed Immunizations addressed - none needed Smoking counseling  - none needed Evidence for depression or other mood disorder - none significant Most recent labs reviewed. I have personally reviewed and have noted: 1) the patient's medical and social history 2) The patient's current medications and supplements 3) The patient's height, weight, and BMI have been recorded in the chart   Vitamin D  deficiency Last vitamin D  Lab Results  Component Value Date   VD25OH 19.03 (L) 01/21/2023   Low, to start oral replacement   Hypertension BP Readings from Last 3 Encounters:  01/17/24 116/74  03/22/23 110/70  01/10/23 118/78   Stable, pt to continue medical treatment card cD 240 mg every day, toprol  xl 50 qd   Hyperlipidemia Lab Results  Component Value Date   LDLCALC 45 01/21/2023   Stable, pt to continue current statin lipitor  80 mg every day, zetia  10 mg qd  Hyperglycemia Lab Results  Component Value Date   HGBA1C 6.0 01/21/2023   Stable, pt to continue current medical treatment  - diet, wt control   B12 deficiency Lab Results  Component Value Date   VITAMINB12 353 01/21/2023   Stable, cont oral replacement - b12 1000 mcg qd   Erectile dysfunction Mild to mod new onset, for cialis 20 mg prn, to f/u any worsening symptoms or concerns  Followup: Return in about 1 year (around 01/16/2025).  [name], MD 01/17/2024 4:55 PM [facility] [place] [facility] - [facility]

## 2024-01-17 NOTE — Patient Instructions (Signed)
 Please take all new medication as prescribed -- the cialis  Please continue all other medications as before, and refills have been done if requested.  Please have the pharmacy call with any other refills you may need.  Please continue your efforts at being more active, low cholesterol diet, and weight control.  You are otherwise up to date with prevention measures today.  Please keep your appointments with your specialists as you may have planned - Dr Jeffrie Cardiology  Please go to the LAB at the blood drawing area for the tests to be done  You will be contacted by phone if any changes need to be made immediately.  Otherwise, you will receive a letter about your results with an explanation, but please check with MyChart first.  Please make an Appointment to return for your 1 year visit, or sooner if needed

## 2024-01-17 NOTE — Assessment & Plan Note (Signed)
 Lab Results  Component Value Date   VITAMINB12 353 01/21/2023   Stable, cont oral replacement - b12 1000 mcg qd

## 2024-01-22 ENCOUNTER — Other Ambulatory Visit

## 2024-01-22 ENCOUNTER — Other Ambulatory Visit: Payer: Self-pay

## 2024-01-22 ENCOUNTER — Ambulatory Visit: Payer: Self-pay | Admitting: Internal Medicine

## 2024-01-22 DIAGNOSIS — Z125 Encounter for screening for malignant neoplasm of prostate: Secondary | ICD-10-CM | POA: Diagnosis not present

## 2024-01-22 DIAGNOSIS — E559 Vitamin D deficiency, unspecified: Secondary | ICD-10-CM | POA: Diagnosis not present

## 2024-01-22 DIAGNOSIS — E782 Mixed hyperlipidemia: Secondary | ICD-10-CM | POA: Diagnosis not present

## 2024-01-22 DIAGNOSIS — R739 Hyperglycemia, unspecified: Secondary | ICD-10-CM

## 2024-01-22 DIAGNOSIS — E538 Deficiency of other specified B group vitamins: Secondary | ICD-10-CM | POA: Diagnosis not present

## 2024-01-22 LAB — BASIC METABOLIC PANEL WITH GFR
BUN: 14 mg/dL (ref 6–23)
CO2: 22 meq/L (ref 19–32)
Calcium: 9.1 mg/dL (ref 8.4–10.5)
Chloride: 107 meq/L (ref 96–112)
Creatinine, Ser: 1.07 mg/dL (ref 0.40–1.50)
GFR: 70.78 mL/min (ref >60.00)
Glucose, Bld: 101 mg/dL — ABNORMAL HIGH (ref 70–99)
Potassium: 4.1 meq/L (ref 3.5–5.1)
Sodium: 141 meq/L (ref 135–145)

## 2024-01-22 LAB — TSH: TSH: 1.13 u[IU]/mL (ref 0.35–5.50)

## 2024-01-22 LAB — LIPID PANEL
Cholesterol: 110 mg/dL (ref 0–200)
HDL: 40.2 mg/dL (ref >39.00)
LDL Cholesterol: 54 mg/dL (ref 0–99)
NonHDL: 69.41
Total CHOL/HDL Ratio: 3
Triglycerides: 79 mg/dL (ref 0.0–149.0)
VLDL: 15.8 mg/dL (ref 0.0–40.0)

## 2024-01-22 LAB — CBC WITH DIFFERENTIAL/PLATELET
Basophils Absolute: 0 K/uL (ref 0.0–0.1)
Basophils Relative: 0.8 % (ref 0.0–3.0)
Eosinophils Absolute: 0.1 K/uL (ref 0.0–0.7)
Eosinophils Relative: 2.2 % (ref 0.0–5.0)
HCT: 45.8 % (ref 39.0–52.0)
Hemoglobin: 15.2 g/dL (ref 13.0–17.0)
Lymphocytes Relative: 29.4 % (ref 12.0–46.0)
Lymphs Abs: 1.9 K/uL (ref 0.7–4.0)
MCHC: 33.1 g/dL (ref 30.0–36.0)
MCV: 94.5 fl (ref 78.0–100.0)
Monocytes Absolute: 0.7 K/uL (ref 0.1–1.0)
Monocytes Relative: 10.4 % (ref 3.0–12.0)
Neutro Abs: 3.6 K/uL (ref 1.4–7.7)
Neutrophils Relative %: 57.2 % (ref 43.0–77.0)
Platelets: 160 K/uL (ref 150.0–400.0)
RBC: 4.85 Mil/uL (ref 4.22–5.81)
RDW: 13.6 % (ref 11.5–15.5)
WBC: 6.3 K/uL (ref 4.0–10.5)

## 2024-01-22 LAB — HEPATIC FUNCTION PANEL
ALT: 28 U/L (ref 0–53)
AST: 20 U/L (ref 0–37)
Albumin: 4.4 g/dL (ref 3.5–5.2)
Alkaline Phosphatase: 75 U/L (ref 39–117)
Bilirubin, Direct: 0.2 mg/dL (ref 0.0–0.3)
Total Bilirubin: 1 mg/dL (ref 0.2–1.2)
Total Protein: 6.6 g/dL (ref 6.0–8.3)

## 2024-01-22 LAB — URINALYSIS, ROUTINE W REFLEX MICROSCOPIC
Bilirubin Urine: NEGATIVE
Hgb urine dipstick: NEGATIVE
Ketones, ur: NEGATIVE
Leukocytes,Ua: NEGATIVE
Nitrite: NEGATIVE
RBC / HPF: NONE SEEN (ref 0–?)
Specific Gravity, Urine: <=1.005 — AB (ref 1.000–1.030)
Total Protein, Urine: NEGATIVE
Urine Glucose: NEGATIVE
Urobilinogen, UA: 0.2 (ref 0.0–1.0)
WBC, UA: NONE SEEN (ref 0–?)
pH: 6 (ref 5.0–8.0)

## 2024-01-22 LAB — VITAMIN B12: Vitamin B-12: 294 pg/mL (ref 211–911)

## 2024-01-22 LAB — PSA: PSA: 2.75 ng/mL (ref 0.10–4.00)

## 2024-01-22 LAB — HEMOGLOBIN A1C: Hgb A1c MFr Bld: 6.1 % (ref 4.6–6.5)

## 2024-01-22 LAB — VITAMIN D 25 HYDROXY (VIT D DEFICIENCY, FRACTURES): VITD: 22.66 ng/mL — ABNORMAL LOW (ref 30.00–100.00)

## 2024-02-05 ENCOUNTER — Telehealth: Payer: Self-pay | Admitting: Cardiology

## 2024-02-05 DIAGNOSIS — I480 Paroxysmal atrial fibrillation: Secondary | ICD-10-CM

## 2024-02-05 NOTE — Telephone Encounter (Signed)
*  STAT* If patient is at the pharmacy, call can be transferred to refill team.   1. Which medications need to be refilled? (please list name of each medication and dose if known)   ELIQUIS  5 MG TABS tablet   2. Would you like to learn more about the convenience, safety, & potential cost savings by using the [facility]?   3. Are you open to using the [facility] (Type [facility]. ).  4. Which pharmacy/location (including street and city if local pharmacy) is medication to be sent to?  [phone] [place] [place] - [address]   5. Do they need a 30 day or 90 day supply?   90 day  Patient stated he still has some medication. Patient has appointment scheduled with Dr. Jeffrie on 11/7.

## 2024-02-07 MED ORDER — APIXABAN 5 MG PO TABS: 5.0000 mg | ORAL_TABLET | Freq: Two times a day (BID) | ORAL | 1 refills | Status: AC

## 2024-03-21 ENCOUNTER — Emergency Department (HOSPITAL_BASED_OUTPATIENT_CLINIC_OR_DEPARTMENT_OTHER)
Admission: EM | Admit: 2024-03-21 | Discharge: 2024-03-21 | Disposition: A | Discharge status: Home / Self Care | Attending: Emergency Medicine | Admitting: Emergency Medicine

## 2024-03-21 ENCOUNTER — Emergency Department (HOSPITAL_BASED_OUTPATIENT_CLINIC_OR_DEPARTMENT_OTHER)

## 2024-03-21 ENCOUNTER — Encounter (HOSPITAL_BASED_OUTPATIENT_CLINIC_OR_DEPARTMENT_OTHER): Payer: Self-pay

## 2024-03-21 ENCOUNTER — Other Ambulatory Visit: Payer: Self-pay

## 2024-03-21 DIAGNOSIS — I480 Paroxysmal atrial fibrillation: Secondary | ICD-10-CM | POA: Insufficient documentation

## 2024-03-21 DIAGNOSIS — I10 Essential (primary) hypertension: Secondary | ICD-10-CM | POA: Diagnosis not present

## 2024-03-21 DIAGNOSIS — I2510 Atherosclerotic heart disease of native coronary artery without angina pectoris: Secondary | ICD-10-CM | POA: Diagnosis not present

## 2024-03-21 DIAGNOSIS — S0083XA Contusion of other part of head, initial encounter: Secondary | ICD-10-CM | POA: Insufficient documentation

## 2024-03-21 DIAGNOSIS — Z79899 Other long term (current) drug therapy: Secondary | ICD-10-CM | POA: Diagnosis not present

## 2024-03-21 DIAGNOSIS — S0990XA Unspecified injury of head, initial encounter: Secondary | ICD-10-CM | POA: Diagnosis present

## 2024-03-21 DIAGNOSIS — Z7901 Long term (current) use of anticoagulants: Secondary | ICD-10-CM | POA: Diagnosis not present

## 2024-03-21 DIAGNOSIS — W228XXA Striking against or struck by other objects, initial encounter: Secondary | ICD-10-CM | POA: Insufficient documentation

## 2024-03-21 DIAGNOSIS — R55 Syncope and collapse: Secondary | ICD-10-CM | POA: Insufficient documentation

## 2024-03-21 LAB — COMPREHENSIVE METABOLIC PANEL WITH GFR
ALT: 28 U/L (ref 0–44)
AST: 24 U/L (ref 15–41)
Albumin: 4.1 g/dL (ref 3.5–5.0)
Alkaline Phosphatase: 98 U/L (ref 38–126)
Anion gap: 9 (ref 5–15)
BUN: 19 mg/dL (ref 8–23)
CO2: 23 mmol/L (ref 22–32)
Calcium: 9.4 mg/dL (ref 8.9–10.3)
Chloride: 108 mmol/L (ref 98–111)
Creatinine, Ser: 1.06 mg/dL (ref 0.61–1.24)
GFR, Estimated: >60 mL/min (ref >60)
Glucose, Bld: 105 mg/dL — ABNORMAL HIGH (ref 70–99)
Potassium: 4.3 mmol/L (ref 3.5–5.1)
Sodium: 140 mmol/L (ref 135–145)
Total Bilirubin: 0.7 mg/dL (ref 0.0–1.2)
Total Protein: 6.5 g/dL (ref 6.5–8.1)

## 2024-03-21 LAB — CBC WITH DIFFERENTIAL/PLATELET
Abs Immature Granulocytes: 0.03 K/uL (ref 0.00–0.07)
Basophils Absolute: 0 K/uL (ref 0.0–0.1)
Basophils Relative: 0 %
Eosinophils Absolute: 0.1 K/uL (ref 0.0–0.5)
Eosinophils Relative: 1 %
HCT: 44.2 % (ref 39.0–52.0)
Hemoglobin: 15.2 g/dL (ref 13.0–17.0)
Immature Granulocytes: 0 %
Lymphocytes Relative: 12 %
Lymphs Abs: 1.1 K/uL (ref 0.7–4.0)
MCH: 31.9 pg (ref 26.0–34.0)
MCHC: 34.4 g/dL (ref 30.0–36.0)
MCV: 92.9 fL (ref 80.0–100.0)
Monocytes Absolute: 0.7 K/uL (ref 0.1–1.0)
Monocytes Relative: 8 %
Neutro Abs: 6.8 K/uL (ref 1.7–7.7)
Neutrophils Relative %: 79 %
Platelets: 165 K/uL (ref 150–400)
RBC: 4.76 MIL/uL (ref 4.22–5.81)
RDW: 12.8 % (ref 11.5–15.5)
WBC: 8.7 K/uL (ref 4.0–10.5)
nRBC: 0 % (ref 0.0–0.2)

## 2024-03-21 LAB — MAGNESIUM: Magnesium: 1.8 mg/dL (ref 1.7–2.4)

## 2024-03-21 NOTE — Discharge Instructions (Addendum)
 All the blood work and x-rays look good today.  Start using your CPAP again and continue your current medications.  However if you continue to have episodes of lightheadedness or any more passing out you need to follow-up with Dr. Norleen and Dr. Jeffrie.  If you develop persistent headache, vision changes, confusion or vomiting you should return immediately for repeat CAT scan of your brain

## 2024-03-21 NOTE — ED Triage Notes (Signed)
 Arrives ambulatory to the ED with complaints of having a syncopal episode this morning, hitting his head. Patient is also on eliquis  for atrial fib.

## 2024-03-21 NOTE — ED Provider Notes (Signed)
 [place] EMERGENCY DEPARTMENT AT [facility] Provider Note   CSN: [id number] Arrival date & time: 03/21/24  1027     Patient presents with: Loss of Consciousness and Head Injury   Richard [name] is a 68 y.o. male.   Patient is a 68 year old male with a history of CAD, hypertension, hyperlipidemia, sleep apnea, paroxysmal atrial fibrillation on Eliquis  and prior stroke who is presenting today after a syncopal event at his home.  Patient reports that this morning was the same as any other morning he was doing some electrical work and had cleaned up and gone into his kitchen and was getting ready to start making some biscuits.  He got up and walked into the pantry and started feeling slightly dizzy and then reports the next thing he knew he was waking up on the floor.  His wife was in the kitchen with him and states that he was talking and she heard him fall and within seconds she was right there with him and he was already responding and starting to ask what happened.  He denied having any chest pain, shortness of breath, palpitations or nausea or vomiting.  He was able to get up and reports he now feels fine.  He reports he will have these dizzy episodes intermittently usually upon standing that he did talk with his cardiologist about but they felt it was most likely the results of his medication but that it was not anything that needed immediate evaluation for.  He has been able to walk and has not had any other symptoms.  His wife denies any focal deficits or strokelike symptoms today with this happening.  He has been eating and drinking normally and has had no recent change in his medications.  He has not taken his metoprolol  this morning but has taken his Eliquis  and other medications.  He does report for the last year he has been inconsistent with wearing his CPAP machine and is also noticed when he does not wear it as frequently he has more episodes of A-fib although he cannot really  feel it.  No alcohol, drugs or tobacco.  The history is provided by the patient, the spouse and medical records.  Loss of Consciousness Head Injury      Prior to Admission medications   Medication Sig Start Date End Date Taking? Authorizing Provider  apixaban  (ELIQUIS ) 5 MG TABS tablet Take 1 tablet (5 mg total) by mouth 2 (two) times daily. 02/07/24   Jeffrie Oneil BROCKS, MD  atorvastatin  (LIPITOR ) 80 MG tablet TAKE 1 TABLET BY MOUTH EVERY DAY 04/03/23   Jeffrie Oneil BROCKS, MD  calcium -vitamin D  (OSCAL WITH D) 500-5 MG-MCG tablet Take 1 tablet by mouth as needed.    [provider]  diltiazem  (CARDIZEM  CD) 240 MG 24 hr capsule TAKE 1 CAPSULE BY MOUTH EVERY DAY 04/05/23   Jeffrie Oneil BROCKS, MD  ezetimibe  (ZETIA ) 10 MG tablet TAKE 1 TABLET BY MOUTH EVERY DAY 04/05/23   Jeffrie Oneil BROCKS, MD  lisinopril  (ZESTRIL ) 5 MG tablet TAKE 1 TABLET (5 MG TOTAL) BY MOUTH DAILY. 04/05/23   Jeffrie Oneil BROCKS, MD  metoprolol  succinate (TOPROL -XL) 50 MG 24 hr tablet Take 1 tablet (50 mg total) by mouth daily. 04/22/23   Jeffrie Oneil BROCKS, MD  NON FORMULARY B12  pt take a 2-3 times aweek    [provider]  tadalafil  (CIALIS ) 20 MG tablet Take 0.5-1 tablets (10-20 mg total) by mouth every other day as needed for  erectile dysfunction. 01/17/24   Norleen Lynwood ORN, MD    Allergies: Penicillins    Review of Systems  Cardiovascular:  Positive for syncope.    Updated Vital Signs BP (!) 168/63 (BP Location: Right Arm)   Pulse 86   Temp 97.8 F (36.6 C) (Oral)   Resp 18   Ht 6' (1.829 m)   Wt 93 kg   SpO2 100%   BMI 27.81 kg/m   Physical Exam Vitals and nursing note reviewed.  Constitutional:      General: He is not in acute distress.    Appearance: He is well-developed.  HENT:     Head: Normocephalic and atraumatic.      Comments: 2 small contusions on the left side of the face Eyes:     Conjunctiva/sclera: Conjunctivae normal.     Pupils: Pupils are equal, round, and reactive to light.   Cardiovascular:     Rate and Rhythm: Normal rate. Rhythm irregularly irregular.     Heart sounds: No murmur heard. Pulmonary:     Effort: Pulmonary effort is normal. No respiratory distress.     Breath sounds: Normal breath sounds. No wheezing or rales.  Abdominal:     General: There is no distension.     Palpations: Abdomen is soft.     Tenderness: There is no abdominal tenderness. There is no guarding or rebound.  Musculoskeletal:        General: No tenderness. Normal range of motion.     Cervical back: Normal range of motion and neck supple. No tenderness.     Right lower leg: No edema.     Left lower leg: No edema.  Skin:    General: Skin is warm and dry.     Findings: No erythema or rash.  Neurological:     Mental Status: He is alert and oriented to person, place, and time.  Psychiatric:        Behavior: Behavior normal.     (all labs ordered are listed, but only abnormal results are displayed) Labs Reviewed  COMPREHENSIVE METABOLIC PANEL WITH GFR - Abnormal; Notable for the following components:      Result Value   Glucose, Bld 105 (*)    All other components within normal limits  CBC WITH DIFFERENTIAL/PLATELET  MAGNESIUM    EKG: EKG Interpretation Date/Time:  Saturday March 21 2024 10:55:26 EDT Ventricular Rate:  93 PR Interval:    QRS Duration:  88 QT Interval:  366 QTC Calculation: 425 R Axis:   61  Text Interpretation: recurrent Atrial fibrillation Ventricular premature complex Confirmed by Doretha Folks ([id number]) on 03/21/2024 11:15:38 AM  Radiology: [facility] Chest Port 1 View Result Date: 03/21/2024 CLINICAL DATA:  9926 Syncope and collapse 9926 EXAM: PORTABLE CHEST 1 VIEW COMPARISON:  Chest x-ray 01/05/2018, chest x-ray 10/18/2016 FINDINGS: The heart and mediastinal contours are within normal limits. No focal consolidation. No pulmonary edema. No pleural effusion. No pneumothorax. No acute osseous abnormality. Chronic retained density overlying the right  hilar region. IMPRESSION: No active disease. Electronically Signed   By: Morgane  Naveau M.D.   On: 03/21/2024 11:47   CT Head Wo Contrast Result Date: 03/21/2024 CLINICAL DATA:  Provided history: Head trauma, minor. Loss of consciousness. Head injury. EXAM: CT HEAD WITHOUT CONTRAST TECHNIQUE: Contiguous axial images were obtained from the base of the skull through the vertex without intravenous contrast. RADIATION DOSE REDUCTION: This exam was performed according to the departmental dose-optimization program which includes automated exposure control, adjustment of the  mA and/or kV according to patient size and/or use of iterative reconstruction technique. COMPARISON:  Brain MRI 01/05/2018. FINDINGS: Brain: No age-advanced or lobar predominant cerebral atrophy. Known small chronic cortical infarct at the left temporoparietal junction. There is no acute intracranial hemorrhage. No acute demarcated cortical infarct. No extra-axial fluid collection. No evidence of an intracranial mass. No midline shift. Vascular: No hyperdense vessel. Skull: No calvarial fracture or aggressive osseous lesion. Sinuses/Orbits: No mass or acute finding within the imaged orbits. No significant paranasal sinus disease at the imaged levels. IMPRESSION: 1.  No evidence of an acute intracranial abnormality. 2. Known small chronic cortical infarct at the left temporoparietal junction. Electronically Signed   By: Rockey Childs D.O.   On: 03/21/2024 11:40     Procedures   Medications Ordered in the ED - No data to display                                  Medical Decision Making Amount and/or Complexity of Data Reviewed Independent Historian: spouse External Data Reviewed: notes. Labs: ordered. Decision-making details documented in ED Course. Radiology: ordered and independent interpretation performed. Decision-making details documented in ED Course. ECG/medicine tests: ordered and independent interpretation performed.  Decision-making details documented in ED Course.   Pt with multiple medical problems and comorbidities and presenting today with a complaint that caries a high risk for morbidity and mortality.  Here today after a syncopal event at home which was brief and seems to be most likely related to cardiovascular.  Low suspicion for stroke at this time.  Patient is noted to be in atrial fibrillation right now which she reports he goes in and out of.  Symptoms seem to be possibly orthostatic as they occurred after standing and with being in A-fib could have lower threshold for syncope.  He has otherwise been eating and drinking per his normal.  He has had no medication changes.  No recent illnesses.  He denies any chest pain or shortness of breath concerning for ACS.  Low suspicion for PE at this time.  Will do a CT of his head as he is on Eliquis  and did hit his head during this event.  Will ensure normal electrolytes, no AKI or anemia that would contribute to his symptoms.  I independently interpreted patient's EKG which shows atrial fibrillation without other acute findings.  His atrial fibrillation is controlled rate. I independently interpreted patient's labs which shows a normal CBC, CMP and magnesium level.  I have independently visualized and interpreted pt's images today. Head CT without acute findings and chest x-ray is normal.  Radiology does note the chronic cortical infarct at the left temporoparietal junction but no acute findings.  Findings were discussed with the patient.  He remains asymptomatic.  Heart rate remains controlled.  At this time patient appears stable for discharge.  Given follow-up and return instructions.  No indication for admission at this time.     Final diagnoses:  Syncope, unspecified syncope type  Paroxysmal atrial fibrillation [facility])    ED Discharge Orders     None          Doretha Folks, MD 03/21/24 1243

## 2024-04-06 ENCOUNTER — Telehealth: Payer: Self-pay | Admitting: Cardiology

## 2024-04-06 MED ORDER — LISINOPRIL 5 MG PO TABS: 5.0000 mg | ORAL_TABLET | Freq: Every day | ORAL | 0 refills | Status: DC

## 2024-04-06 MED ORDER — ATORVASTATIN CALCIUM 80 MG PO TABS: 80.0000 mg | ORAL_TABLET | Freq: Every day | ORAL | 0 refills | Status: DC

## 2024-04-06 MED ORDER — DILTIAZEM HCL ER COATED BEADS 240 MG PO CP24: 240.0000 mg | ORAL_CAPSULE | Freq: Every day | ORAL | 0 refills | Status: DC

## 2024-04-06 MED ORDER — METOPROLOL SUCCINATE ER 50 MG PO TB24: 50.0000 mg | ORAL_TABLET | Freq: Every day | ORAL | 0 refills | Status: DC

## 2024-04-06 NOTE — Telephone Encounter (Signed)
*  STAT* If patient is at the pharmacy, call can be transferred to refill team.   1. Which medications need to be refilled? (please list name of each medication and dose if known)   atorvastatin  (LIPITOR ) 80 MG tablet  metoprolol  succinate (TOPROL -XL) 50 MG 24 hr tablet  ezetimibe  (ZETIA ) 10 MG tablet   diltiazem  (CARDIZEM  CD) 240 MG 24 hr capsule    lisinopril  (ZESTRIL ) 5 MG tablet   2. Would you like to learn more about the convenience, safety, & potential cost savings by using the [facility]? No    3. Are you open to using the [facility] (Type [facility]. No    4. Which pharmacy/location (including street and city if local pharmacy) is medication to be sent to? [phone] [facility] - [place], [place] - [address]     5. Do they need a 30 day or 90 day supply? 90 day   Pt has appt scheduled 11/7.

## 2024-04-06 NOTE — Telephone Encounter (Signed)
 Sent in refills

## 2024-04-08 ENCOUNTER — Telehealth: Payer: Self-pay | Admitting: Cardiology

## 2024-04-08 MED ORDER — EZETIMIBE 10 MG PO TABS: 10.0000 mg | ORAL_TABLET | Freq: Every day | ORAL | 0 refills | Status: DC

## 2024-04-08 NOTE — Telephone Encounter (Signed)
*  STAT* If patient is at the pharmacy, call can be transferred to refill team.   1. Which medications need to be refilled? (please list name of each medication and dose if known) ezetimibe  (ZETIA ) 10 MG tablet   2. Which pharmacy/location (including street and city if local pharmacy) is medication to be sent to?  [phone] - [place], [place] - [address]    3. Do they need a 30 day or 90 day supply? 90

## 2024-04-08 NOTE — Telephone Encounter (Signed)
 RX sent in

## 2024-04-24 ENCOUNTER — Ambulatory Visit: Admitting: Cardiology

## 2024-05-29 ENCOUNTER — Ambulatory Visit (HOSPITAL_BASED_OUTPATIENT_CLINIC_OR_DEPARTMENT_OTHER): Admitting: Cardiology

## 2024-05-29 ENCOUNTER — Encounter (HOSPITAL_BASED_OUTPATIENT_CLINIC_OR_DEPARTMENT_OTHER): Payer: Self-pay | Admitting: Cardiology

## 2024-05-29 VITALS — BP 108/62 | HR 53 | Ht 72.0 in | Wt 207.0 lb

## 2024-05-29 DIAGNOSIS — I4819 Other persistent atrial fibrillation: Secondary | ICD-10-CM

## 2024-05-29 DIAGNOSIS — I10 Essential (primary) hypertension: Secondary | ICD-10-CM

## 2024-05-29 NOTE — Progress Notes (Signed)
 Cardiology Office Note:  .   Date:  05/29/2024  ID:  Richard [name], DOB [birth year], MRN [id number] PCP: Norleen Lynwood ORN, MD   HeartCare Providers Cardiologist:  Oneil Parchment, MD Sleep Medicine:  Wilbert Bihari, MD     History of Present Illness: .   Richard [name] is a 68 y.o. male Discussed the use of AI scribe  History of Present Illness Richard [name] is a 68 year old male with persistent atrial fibrillation who presents with a recent fall and concerns about low blood pressure.  He experienced a fall while preparing breakfast, during which he briefly lost consciousness. He describes the incident as 'bizarre' and notes the absence of headache or dizziness at the time. He recalls having had 'some little quick spells of dizziness' in the past, which resolve quickly. No significant injury was sustained from the fall, only a minor glancing blow off the side of a cabinet.  He is concerned about his blood pressure, noting that it has been 'very low' for him over the past two years. He is unsure what constitutes low blood pressure for him personally. He mentions feeling a lack of energy, stating that he finds himself wanting to be busy but unable to 'pull the trigger' to get up and move.  He has a history of persistent atrial fibrillation, hyperlipidemia, obstructive sleep apnea on CPAP, and hypertension. He is currently on multiple medications including Eliquis  5 mg twice a day, atorvastatin  80 mg daily, diltiazem  240 mg CD, Zetia  10 mg daily, lisinopril  5 mg daily, and metoprolol  succinate 50 mg daily. He recalls having a stroke in 2019, which he refers to as a 'mini stroke'.  He inquires about the use of Cialis  for erectile dysfunction, noting that he does not have a problem initiating but rather maintaining an erection.       Studies Reviewed: .        Results DIAGNOSTIC Echocardiogram: EF 45-50%, mildly reduced ejection fraction, stable (2022) Risk Assessment/Calculations:             Physical Exam:   VS:  BP 108/62 (BP Location: Left Arm, Patient Position: Sitting, Cuff Size: Normal)   Pulse (!) 53   Ht 6' (1.829 m)   Wt 207 lb (93.9 kg)   SpO2 98%   BMI 28.07 kg/m    Wt Readings from Last 3 Encounters:  05/29/24 207 lb (93.9 kg)  03/21/24 205 lb 0.4 oz (93 kg)  01/17/24 205 lb (93 kg)    GEN: Well nourished, well developed in no acute distress NECK: No JVD; No carotid bruits CARDIAC: Irreg, no murmurs, no rubs, no gallops RESPIRATORY:  Clear to auscultation without rales, wheezing or rhonchi  ABDOMEN: Soft, non-tender, non-distended EXTREMITIES:  No edema; No deformity   ASSESSMENT AND PLAN: .    Assessment and Plan Assessment & Plan Persistent atrial fibrillation Managed with metoprolol  succinate 50 mg as well as diltiazem  240 mg.  EF 45%.  Heart rate control is a priority, aiming for a range of 60-100 bpm. Recent fall raises concerns about dizziness and potential medication side effects.  Hypotension to be exact. - Discontinued diltiazem  to potentially improve blood pressure and reduce dizziness. - Monitor heart rate to ensure it remains within the target range of 60-100 bpm.  Hopefully the Toprol  will do the trick alone. - Will schedule follow-up with APP for heart rate assessment and EKG in 1-2 months.  Heart failure with mildly reduced ejection fraction (EF 45-50%) Heart  failure with mildly reduced ejection fraction, EF 45-50%, is well-managed. Current management includes metoprolol  succinate, which is preferred over diltiazem  due to its benefits in heart failure management. - Continue metoprolol  succinate for heart failure management.  Hypertension Managed with lisinopril  and metoprolol  succinate. Recent low blood pressure readings and dizziness suggest potential overmedication. Discontinuation of diltiazem  may help stabilize blood pressure. - Discontinued diltiazem  to potentially improve blood pressure. - Monitor blood pressure and  symptoms of dizziness. - Will consider adjusting lisinopril  dosage if blood pressure remains low after diltiazem  discontinuation.         Dispo: 1- 2 months.  Repeat EKG.  Signed, Oneil Parchment, MD

## 2024-05-29 NOTE — Patient Instructions (Signed)
 Medication Instructions:   DISCONTINUE Diltiazem    *If you need a refill on your cardiac medications before your next appointment, please call your pharmacy*  Lab Work:  None ordered.  If you have labs (blood work) drawn today and your tests are completely normal, you will receive your results only by: MyChart Message (if you have MyChart) OR A paper copy in the mail If you have any lab test that is abnormal or we need to change your treatment, we will call you to review the results.  Testing/Procedures:  None ordered.  Follow-Up: At [facility], you and your health needs are our priority.  As part of our continuing mission to provide you with exceptional heart care, our providers are all part of one team.  This team includes your primary Cardiologist (physician) and Advanced Practice Providers or APPs (Physician Assistants and Nurse Practitioners) who all work together to provide you with the care you need, when you need it.  Your next appointment:   1 month(s)  Provider:   Rosaline Bane, NP    We recommend signing up for the patient portal called MyChart.  Sign up information is provided on this After Visit Summary.  MyChart is used to connect with patients for Virtual Visits (Telemedicine).  Patients are able to view lab/test results, encounter notes, upcoming appointments, etc.  Non-urgent messages can be sent to your provider as well.   To learn more about what you can do with MyChart, go to [email or web address].

## 2024-06-24 NOTE — Progress Notes (Addendum)
 " Cardiology Office Note   Date:  06/25/2024  ID:  Richard [name], DOB 12/19/1955, MRN [id number] PCP: Norleen Lynwood ORN, MD  [facility] Providers Cardiologist:  Oneil Parchment, MD Sleep Medicine:  Wilbert Bihari, MD     PMH Persistent atrial fibrillation on anticoagulation Hyperlipidemia Coronary artery calcification with CAC score of 90 in LAD on CCTA 09/2010 OSA on CPAP Stroke HFrEF  He established with heart care in 2019 with prior CT showing proximal LAD disease (less than 40%) on CT in 2012.  He was found to be in A-fib RVR in 2019.  MRI of brain was performed which revealed small volume acute ischemic cortical infarct involving the left parietal temporal region, no hemorrhage or mass noted.  Echo showed reduced EF 40 to 45%.  Weakness in right upper extremity improved.  He was scheduled for DCCV in 2019 but was found to be in sinus rhythm when he arrived for the procedure.  Was later diagnosed with sleep apnea and started on CPAP.  He has maintained consistent follow-up.  Repeat echo 09/26/2020 with slight improvement in EF to 45 to 50%.  Moderately dilated LA, mildly dilated aortic root 42 mm, stable from prior echocardiogram.  Seen by Dr. Parchment 05/29/2024 due to concern about low blood pressure and a recent fall.  Reported falling while preparing breakfast during which time he briefly lost consciousness.  He noted the absence of headache or dizziness but called the incident bizarre.  He recalls having some short spells of dizziness in the past which resolved quickly.  No injury was sustained from the fall only a minor glancing blow off the side of a cabinet.  He reported blood pressure had been very low over the previous 2 years.  He was feeling a lack of energy. He asked about the use of Cialis  for erectile dysfunction due to difficulty maintaining an erection.  Was advised to discontinue diltiazem  to 40 mg daily to potentially improve BP and reduce dizziness.  Advised to monitor HR  and BP with goal HR 60 to 100 bpm.  Advised to return in 1 to 2 months and repeat EKG at that time.  History of Present Illness Discussed the use of AI scribe software for clinical note transcription with the patient, who gave verbal consent to proceed.  History of Present Illness Richard [name] is a very pleasant 69 year old male who presents for follow-up of hypotension. He feels significantly better since stopping diltiazem , with resolution of prior daily dizziness and no further syncopal episodes.  Prior to  discontinuing diltiazem  he was having at least 1 episode of lightheadedness per day. Home heart rate has been slightly higher, with a maximum of 101 bpm. He monitors at home with pulse oximeter with HR generally in the 70s, oxygen saturation have been 96% to 98%. Feels significantly less fatigued. He no longer needs afternoon naps since stopping diltiazem . BP has improved as well with readings around 120/80, increased from  108/62, possibly contributing to less dizziness and fatigue. He denies chest pain, shortness of breath, edema, fatigue, palpitations, presyncope, syncope, orthopnea, and PND.    ROS: See HPI  Studies Reviewed EKG Interpretation Date/Time:  Thursday June 25 2024 14:47:35 EST Ventricular Rate:  101 PR Interval:    QRS Duration:  76 QT Interval:  328 QTC Calculation: 425 R Axis:   50  Text Interpretation: Atrial fibrillation with rapid ventricular response with premature ventricular or aberrantly conducted complexes Nonspecific ST abnormality Confirmed by Srijan Givan,  Rosaline [phone]) on 06/25/2024 2:57:14 PM     No results found for: LIPOA  Risk Assessment/Calculations  CHA2DS2-VASc Score = 6   This indicates a 9.7% annual risk of stroke. The patient's score is based upon: CHF History: 1 HTN History: 1 Diabetes History: 0 Stroke History: 2 Vascular Disease History: 1 Age Score: 1 Gender Score: 0            Physical Exam VS:  BP 122/86 (BP Location:  Left Arm, Patient Position: Sitting, Cuff Size: Normal)   Pulse (!) 101   Ht 6' (1.829 m)   Wt 205 lb (93 kg)   SpO2 96%   BMI 27.80 kg/m    Wt Readings from Last 3 Encounters:  06/25/24 205 lb (93 kg)  05/29/24 207 lb (93.9 kg)  03/21/24 205 lb 0.4 oz (93 kg)    GEN: Well nourished, well developed in no acute distress NECK: No JVD; No carotid bruits CARDIAC: Irregular RR, no murmurs, rubs, gallops RESPIRATORY:  Clear to auscultation without rales, wheezing or rhonchi  ABDOMEN: Soft, non-tender, non-distended EXTREMITIES:  No edema; No deformity    Assessment & Plan Persistent atrial fibrillation on chronic anticoagulation Tachycardia Heart rate is 101 bpm on EKG. Admits he was rushing to get here today.  Generally noting HR in the 70s at home.  He monitors with pulse oximeter and has noted HR up to 101 bpm on few occasions.  He is feeling much more energetic and no longer having any dizziness, lightheadedness, presyncope, or syncope since discontinuing diltiazem .   - Continue routine monitoring of home BP and HR  - Advised him to notify us  if HR is consistently > 120 bpm - Continue Toprol -XL 50 mg daily - Continue Eliquis  5 mg twice daily which is appropriate dose for stroke prevention for CHA2DS2-VASc score of 6  Hypertension Syncope/Presyncope Blood pressure is 122/86 mmHg, slightly elevated since last office visit but acceptable.  He is feeling significant improvement in energy level and is no longer having episodes of lightheadedness, presyncope or syncope.  - Monitor BP routinely - Aim to maintain blood pressure under 130/80 mmHg - Continue lisinopril , metoprolol         Dispo: 6 months with Dr. Jeffrie  Signed, Rosaline Bane, NP-C "

## 2024-06-25 ENCOUNTER — Encounter (HOSPITAL_BASED_OUTPATIENT_CLINIC_OR_DEPARTMENT_OTHER): Payer: Self-pay | Admitting: Nurse Practitioner

## 2024-06-25 ENCOUNTER — Ambulatory Visit (HOSPITAL_BASED_OUTPATIENT_CLINIC_OR_DEPARTMENT_OTHER): Admitting: Nurse Practitioner

## 2024-06-25 VITALS — BP 122/86 | HR 101 | Ht 72.0 in | Wt 205.0 lb

## 2024-06-25 DIAGNOSIS — Z7901 Long term (current) use of anticoagulants: Secondary | ICD-10-CM

## 2024-06-25 DIAGNOSIS — I10 Essential (primary) hypertension: Secondary | ICD-10-CM | POA: Diagnosis not present

## 2024-06-25 DIAGNOSIS — R55 Syncope and collapse: Secondary | ICD-10-CM | POA: Diagnosis not present

## 2024-06-25 DIAGNOSIS — I4819 Other persistent atrial fibrillation: Secondary | ICD-10-CM

## 2024-06-25 NOTE — Patient Instructions (Signed)
 Medication Instructions:   Your physician recommends that you continue on your current medications as directed. Please refer to the Current Medication list given to you today.  *If you need a refill on your cardiac medications before your next appointment, please call your pharmacy*   Follow-Up: At [facility], you and your health needs are our priority.  As part of our continuing mission to provide you with exceptional heart care, our providers are all part of one team.  This team includes your primary Cardiologist (physician) and Advanced Practice Providers or APPs (Physician Assistants and Nurse Practitioners) who all work together to provide you with the care you need, when you need it.  Your next appointment:   6 month(s)  Provider:   Dr. Jeffrie at [facility] or [place] location

## 2024-07-01 ENCOUNTER — Other Ambulatory Visit: Payer: Self-pay | Admitting: Cardiology

## 2024-07-04 ENCOUNTER — Other Ambulatory Visit: Payer: Self-pay | Admitting: Cardiology

## 2024-07-07 ENCOUNTER — Ambulatory Visit: Payer: Self-pay

## 2024-07-07 NOTE — Telephone Encounter (Signed)
 Copied from CRM [id number]. Topic: Clinical - Red Word Triage >> Jul 07, 2024  1:15 PM Jayma L wrote: Red Word that prompted transfer to Nurse Triage: has a cough getting worse , and a head cold , runny nose worse at night   Please warm transfer Red Word call to Nurse Triage and then click Send Message and Close CRM. >> Jul 07, 2024  1:15 PM Jayma L wrote: Reason for Triage: has a cough getting worse , and a head cold , runny nose worse at night

## 2024-07-07 NOTE — Telephone Encounter (Signed)
 FYI Only or Action Required?: FYI only for provider: appointment scheduled on 07/08/24.  Patient was last seen in primary care on 01/17/2024 by Norleen Lynwood ORN, MD.  Called Nurse Triage reporting Cough and Nasal Congestion.  Symptoms began several weeks ago.  Interventions attempted: OTC medications: cough medication.  Symptoms are: unchanged.  Triage Disposition: See PCP When Office is Open (Within 3 Days)  Patient/caregiver understands and will follow disposition?: Yes   Reason for Disposition  Cough has been present for > 3 weeks  Answer Assessment - Initial Assessment Questions Patient states that he has ongoing cough and runny nose for the last few weeks. He states that is seems to be getting better at times and then comes back. He reports occasional wheezing that was last present yesterday. Mentions that he has taken prednisone  in the past for similar symptoms. Office visit advised.   1. ONSET: When did the cough begin?      A few weeks ago  2. SEVERITY: How bad is the cough today?      Moderate  3. SPUTUM: Describe the color of your sputum (e.g., none, dry cough; clear, white, yellow, green)     Clear  4. HEMOPTYSIS: Are you coughing up any blood? If Yes, ask: How much? (e.g., flecks, streaks, tablespoons, etc.)     No  5. DIFFICULTY BREATHING: Are you having difficulty breathing? If Yes, ask: How bad is it? (e.g., mild, moderate, severe)      No  6. FEVER: Do you have a fever? If Yes, ask: What is your temperature, how was it measured, and when did it start?     No  7. CARDIAC HISTORY: Do you have any history of heart disease? (e.g., heart attack, congestive heart failure)      Atrial Fibrillation  8. LUNG HISTORY: Do you have any history of lung disease?  (e.g., pulmonary embolus, asthma, emphysema)     OSA  9. PE RISK FACTORS: Do you have a history of blood clots? (or: recent major surgery, recent prolonged travel, bedridden)     No  10.  OTHER SYMPTOMS: Do you have any other symptoms? (e.g., runny nose, wheezing, chest pain)      Occasional wheezing-not currently present; runny nose  11. PREGNANCY: Is there any chance you are pregnant? When was your last menstrual period?       NA  12. TRAVEL: Have you traveled out of the country in the last month? (e.g., travel history, exposures)       No  Protocols used: Cough - Acute Productive-A-AH  Reason for Triage: has a cough getting worse , and a head cold , runny nose worse at night

## 2024-07-08 ENCOUNTER — Encounter: Payer: Self-pay | Admitting: Internal Medicine

## 2024-07-08 ENCOUNTER — Ambulatory Visit: Admitting: Internal Medicine

## 2024-07-08 VITALS — BP 110/74 | HR 94 | Temp 98.3°F | Ht 72.0 in | Wt 199.0 lb

## 2024-07-08 DIAGNOSIS — R739 Hyperglycemia, unspecified: Secondary | ICD-10-CM

## 2024-07-08 DIAGNOSIS — J45901 Unspecified asthma with (acute) exacerbation: Secondary | ICD-10-CM

## 2024-07-08 DIAGNOSIS — I10 Essential (primary) hypertension: Secondary | ICD-10-CM | POA: Diagnosis not present

## 2024-07-08 DIAGNOSIS — R062 Wheezing: Secondary | ICD-10-CM

## 2024-07-08 MED ORDER — ALBUTEROL SULFATE HFA 108 (90 BASE) MCG/ACT IN AERS: 2.0000 | INHALATION_SPRAY | Freq: Four times a day (QID) | RESPIRATORY_TRACT | 0 refills | Status: AC | PRN

## 2024-07-08 MED ORDER — METHYLPREDNISOLONE ACETATE 80 MG/ML IJ SUSP
80.0000 mg | Freq: Once | INTRAMUSCULAR | Status: AC
  Administered 2024-07-08: 80 mg via INTRAMUSCULAR

## 2024-07-08 MED ORDER — PREDNISONE 10 MG PO TABS: ORAL_TABLET | ORAL | 0 refills | Status: AC

## 2024-07-08 NOTE — Patient Instructions (Signed)
 You had the steroid shot today  Please take all new medication as prescribed - the prednisone , and inhaler as needed  Please continue all other medications as before, and refills have been done if requested.  Please have the pharmacy call with any other refills you may need.  Please keep your appointments with your specialists as you may have planned

## 2024-07-08 NOTE — Progress Notes (Signed)
 Patient ID: Richard [name], male   DOB: Jun 13, 1956, 69 y.o.   MRN: [id number]        Chief Complaint: follow up cough wheezing after yard work, htn, hyperglycemia       HPI:  CRIS [name] is a 69 y.o. male here after acute onset mild cough wheezing sob doe x 2-3 days after working in the yard; has hx of similar tx well with prednisone ; No fever, chills and Pt denies chest pain, orthopnea, PND, increased LE swelling, palpitations, dizziness or syncope.   Pt denies polydipsia, polyuria, or new focal neuro s/s.         Wt Readings from Last 3 Encounters:  07/08/24 199 lb (90.3 kg)  06/25/24 205 lb (93 kg)  05/29/24 207 lb (93.9 kg)   BP Readings from Last 3 Encounters:  07/08/24 110/74  06/25/24 122/86  05/29/24 108/62         Past Medical History:  Diagnosis Date   Alcoholism in remission (HCC) 08/25/2011   Allergy    seasonal   Atrial fibrillation (HCC)    CAD (coronary artery disease) 08/25/2011   Colon polyps    Depression    Gout    Hyperlipidemia    Hypertension    OSA on CPAP 08/21/2018   Mild OSA at 11/hr now on CPAP at 11cm H2O.    Sleep apnea    did use cpap about 10 years ago no use now   Stroke [facility])    Past Surgical History:  Procedure Laterality Date   COLONOSCOPY  10-22-11   3 polyps   FINGER NEUROPLASTY     left index finger   left achillies  2003   POLYPECTOMY      reports that he has never smoked. He has never been exposed to tobacco smoke. He has never used smokeless tobacco. He reports that he does not currently use alcohol. He reports that he does not use drugs. family history includes Diabetes in his mother; Heart attack in his brother; Hypertension in his mother. Allergies[1] Medications Ordered Prior to Encounter[2]      ROS:  All others reviewed and negative.  Objective        PE:  BP 110/74 (BP Location: Right Arm, Patient Position: Sitting, Cuff Size: Normal)   Pulse 94   Temp 98.3 F (36.8 C) (Oral)   Ht 6' (1.829 m)   Wt 199 lb  (90.3 kg)   SpO2 98%   BMI 26.99 kg/m                 Constitutional: Pt appears in NAD, not ill appearing               HENT: Head: NCAT.                Right Ear: External ear normal.                 Left Ear: External ear normal.                Eyes: . Pupils are equal, round, and reactive to light. Conjunctivae and EOM are normal               Nose: without d/c or deformity               Neck: Neck supple. Gross normal ROM               Cardiovascular: Normal rate and regular rhythm.  Pulmonary/Chest: Effort normal and breath sounds decreased without rales but with few mild wheezing.                               Neurological: Pt is alert. At baseline orientation, motor grossly intact               Skin: Skin is warm. No rashes, no other new lesions, LE edema - none               Psychiatric: Pt behavior is normal without agitation   Micro: none  Cardiac tracings I have personally interpreted today:  none  Pertinent Radiological findings (summarize): none   Lab Results  Component Value Date   WBC 8.7 03/21/2024   HGB 15.2 03/21/2024   HCT 44.2 03/21/2024   PLT 165 03/21/2024   GLUCOSE 105 (H) 03/21/2024   CHOL 110 01/22/2024   TRIG 79.0 01/22/2024   HDL 40.20 01/22/2024   LDLDIRECT 146.2 08/30/2011   LDLCALC 54 01/22/2024   ALT 28 03/21/2024   AST 24 03/21/2024   NA 140 03/21/2024   K 4.3 03/21/2024   CL 108 03/21/2024   CREATININE 1.06 03/21/2024   BUN 19 03/21/2024   CO2 23 03/21/2024   TSH 1.13 01/22/2024   PSA 2.75 01/22/2024   INR 0.98 01/05/2018   HGBA1C 6.1 01/22/2024   Assessment/Plan:  Richard [name] is a 69 y.o. White or Caucasian [1] male with  has a past medical history of Alcoholism in remission (HCC) (08/25/2011), Allergy, Atrial fibrillation (HCC), CAD (coronary artery disease) (08/25/2011), Colon polyps, Depression, Gout, Hyperlipidemia, Hypertension, OSA on CPAP (08/21/2018), Sleep apnea, and Stroke (HCC).  Wheezing Acute  onset after working in the yard, appears to be bronchospastic type without fever, productivity - pt for depomedrol 80 mg IM, prednisone  taper, and albuterol  hfa prn  Hypertension BP Readings from Last 3 Encounters:  07/08/24 110/74  06/25/24 122/86  05/29/24 108/62   Stable, pt to continue medical treatment lisinopril  5 every day, toprol  xl 50 qd   Hyperglycemia Lab Results  Component Value Date   HGBA1C 6.1 01/22/2024   Stable, pt to continue current medical treatment  - diet, wt control  Followup: Return if symptoms worsen or fail to improve.  [name], MD 07/08/2024 7:50 PM [facility]     [1]  Allergies Allergen Reactions   Penicillins Other (See Comments)    Siblings are allergic Has patient had a PCN reaction causing immediate rash, facial/tongue/throat swelling, SOB or lightheadedness with hypotension: Unknown Has patient had a PCN reaction causing severe rash involving mucus membranes or skin necrosis: Unknown Has patient had a PCN reaction that required hospitalization: Unknown Has patient had a PCN reaction occurring within the last 10 years: Unknown If all of the above answers are NO, then may proceed with Cephalosporin use.   [2]  Current Outpatient Medications on File Prior to Visit  Medication Sig Dispense Refill   acetaminophen  (TYLENOL ) 325 MG tablet Take 650 mg by mouth every 6 (six) hours as needed (pain).     apixaban  (ELIQUIS ) 5 MG TABS tablet Take 1 tablet (5 mg total) by mouth 2 (two) times daily. 180 tablet 1   atorvastatin  (LIPITOR ) 80 MG tablet Take 1 tablet (80 mg total) by mouth daily. 90 tablet 3   calcium -vitamin D  (OSCAL WITH D) 500-5 MG-MCG tablet Take 1 tablet  by mouth as needed.     ezetimibe  (ZETIA ) 10 MG tablet TAKE 1 TABLET BY MOUTH EVERY DAY 90 tablet 3   lisinopril  (ZESTRIL ) 5 MG tablet Take 1 tablet (5 mg total) by mouth daily. 90 tablet 3   metoprolol  succinate  (TOPROL -XL) 50 MG 24 hr tablet Take 1 tablet (50 mg total) by mouth daily. 90 tablet 3   NON FORMULARY B12  pt take a 2-3 times aweek     tadalafil  (CIALIS ) 20 MG tablet Take 0.5-1 tablets (10-20 mg total) by mouth every other day as needed for erectile dysfunction. 10 tablet 11   No current facility-administered medications on file prior to visit.

## 2024-07-08 NOTE — Assessment & Plan Note (Signed)
 Acute onset after working in the yard, appears to be bronchospastic type without fever, productivity - pt for depomedrol 80 mg IM, prednisone  taper, and albuterol  hfa prn

## 2024-07-08 NOTE — Assessment & Plan Note (Signed)
 Lab Results  Component Value Date   HGBA1C 6.1 01/22/2024   Stable, pt to continue current medical treatment  - diet, wt control

## 2024-07-08 NOTE — Assessment & Plan Note (Signed)
 BP Readings from Last 3 Encounters:  07/08/24 110/74  06/25/24 122/86  05/29/24 108/62   Stable, pt to continue medical treatment lisinopril  5 every day, toprol  xl 50 qd
